# Patient Record
Sex: Female | Born: 1960 | Race: White | Hispanic: No | Smoking: Former smoker
Health system: Southern US, Community
[De-identification: ages and names within clinical notes are randomized; demographics above are authoritative.]

## PROBLEM LIST (undated history)

## (undated) DIAGNOSIS — R519 Headache, unspecified: Secondary | ICD-10-CM

## (undated) DIAGNOSIS — F32A Depression, unspecified: Secondary | ICD-10-CM

## (undated) DIAGNOSIS — E079 Disorder of thyroid, unspecified: Secondary | ICD-10-CM

## (undated) DIAGNOSIS — K219 Gastro-esophageal reflux disease without esophagitis: Secondary | ICD-10-CM

## (undated) DIAGNOSIS — E039 Hypothyroidism, unspecified: Secondary | ICD-10-CM

## (undated) DIAGNOSIS — E785 Hyperlipidemia, unspecified: Secondary | ICD-10-CM

## (undated) DIAGNOSIS — I10 Essential (primary) hypertension: Secondary | ICD-10-CM

## (undated) DIAGNOSIS — M1990 Unspecified osteoarthritis, unspecified site: Secondary | ICD-10-CM

## (undated) DIAGNOSIS — F329 Major depressive disorder, single episode, unspecified: Secondary | ICD-10-CM

## (undated) DIAGNOSIS — F419 Anxiety disorder, unspecified: Secondary | ICD-10-CM

## (undated) DIAGNOSIS — D649 Anemia, unspecified: Secondary | ICD-10-CM

## (undated) HISTORY — DX: Major depressive disorder, single episode, unspecified: F32.9

## (undated) HISTORY — PX: BACK SURGERY: SHX140

## (undated) HISTORY — DX: Depression, unspecified: F32.A

## (undated) HISTORY — DX: Hyperlipidemia, unspecified: E78.5

## (undated) HISTORY — DX: Disorder of thyroid, unspecified: E07.9

## (undated) HISTORY — DX: Anxiety disorder, unspecified: F41.9

## (undated) HISTORY — PX: CATARACT EXTRACTION: SUR2

## (undated) HISTORY — DX: Essential (primary) hypertension: I10

## (undated) HISTORY — PX: EYE SURGERY: SHX253

## (undated) HISTORY — PX: SPINE SURGERY: SHX786

## (undated) HISTORY — PX: NECK SURGERY: SHX720

## (undated) HISTORY — PX: ABDOMINAL HYSTERECTOMY: SHX81

## (undated) HISTORY — PX: OTHER SURGICAL HISTORY: SHX169

---

## 1999-04-05 ENCOUNTER — Emergency Department (HOSPITAL_COMMUNITY): Admission: EM | Admit: 1999-04-05 | Discharge: 1999-04-05 | Payer: Self-pay | Admitting: Emergency Medicine

## 2000-11-18 ENCOUNTER — Emergency Department (HOSPITAL_COMMUNITY): Admission: EM | Admit: 2000-11-18 | Discharge: 2000-11-18 | Payer: Self-pay | Admitting: Emergency Medicine

## 2000-11-18 ENCOUNTER — Encounter: Payer: Self-pay | Admitting: Emergency Medicine

## 2001-06-10 ENCOUNTER — Ambulatory Visit (HOSPITAL_COMMUNITY): Admission: RE | Admit: 2001-06-10 | Discharge: 2001-06-10 | Payer: Self-pay | Admitting: Obstetrics and Gynecology

## 2001-07-11 ENCOUNTER — Emergency Department (HOSPITAL_COMMUNITY): Admission: EM | Admit: 2001-07-11 | Discharge: 2001-07-11 | Payer: Self-pay | Admitting: Emergency Medicine

## 2002-11-01 ENCOUNTER — Emergency Department (HOSPITAL_COMMUNITY): Admission: EM | Admit: 2002-11-01 | Discharge: 2002-11-01 | Payer: Self-pay | Admitting: Emergency Medicine

## 2002-12-21 ENCOUNTER — Other Ambulatory Visit: Admission: RE | Admit: 2002-12-21 | Discharge: 2002-12-21 | Payer: Self-pay | Admitting: Gynecology

## 2004-02-22 ENCOUNTER — Inpatient Hospital Stay (HOSPITAL_COMMUNITY): Admission: RE | Admit: 2004-02-22 | Discharge: 2004-02-25 | Payer: Self-pay | Admitting: Specialist

## 2004-06-01 ENCOUNTER — Other Ambulatory Visit: Admission: RE | Admit: 2004-06-01 | Discharge: 2004-06-01 | Payer: Self-pay | Admitting: Family Medicine

## 2005-02-01 ENCOUNTER — Encounter (INDEPENDENT_AMBULATORY_CARE_PROVIDER_SITE_OTHER): Payer: Self-pay | Admitting: *Deleted

## 2005-02-01 ENCOUNTER — Inpatient Hospital Stay (HOSPITAL_COMMUNITY): Admission: RE | Admit: 2005-02-01 | Discharge: 2005-02-04 | Payer: Self-pay | Admitting: Obstetrics and Gynecology

## 2006-04-19 ENCOUNTER — Emergency Department (HOSPITAL_COMMUNITY): Admission: EM | Admit: 2006-04-19 | Discharge: 2006-04-19 | Payer: Self-pay | Admitting: Emergency Medicine

## 2007-11-11 ENCOUNTER — Emergency Department (HOSPITAL_COMMUNITY): Admission: EM | Admit: 2007-11-11 | Discharge: 2007-11-11 | Payer: Self-pay | Admitting: Emergency Medicine

## 2008-01-02 ENCOUNTER — Ambulatory Visit (HOSPITAL_COMMUNITY): Admission: RE | Admit: 2008-01-02 | Discharge: 2008-01-02 | Payer: Self-pay | Admitting: Neurological Surgery

## 2010-03-13 ENCOUNTER — Encounter: Admission: RE | Admit: 2010-03-13 | Discharge: 2010-03-13 | Payer: Self-pay | Admitting: Family Medicine

## 2010-11-28 NOTE — Op Note (Signed)
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   PROCEDURE:  Left lumbar hemilaminectomy, medial facetectomy and  foraminotomy for removal of synovial cyst utilizing microscopic  dissection.   SURGEON:  Tia Alert, MD   ASSISTANT:  Donalee Citrin, MD   ANESTHESIA:  General endotracheal.   COMPLICATIONS:  None apparent.   INDICATIONS FOR PROCEDURE:  [name] is a 50 year old female who  presented with severe left leg pain at L5 distribution.  She had an MRI  which showed a large synovial cyst at L4-5 on the left.  I recommended a  lumbar hemilaminectomy for resection of the synovial cyst.  She  understood the risks, benefits, expected outcome and wished to proceed.   DESCRIPTION OF PROCEDURE:  The patient was taken to the operating room  and after induction of adequate generalized endotracheal anesthesia, she  was rolled on a prone position on the Wilson frame and all pressure  points were padded.  Her lumbar region was prepped with DuraPrep and  then draped in the usual sterile fashion.  Local anesthesia 5 mL was  injected and a small dorsal midline incision was made and carried down  to the lumbosacral fascia.  The fascia was opened on the patient's left  side and taken down in a subperiosteal fashion to expose the  L4-5 on  the left.  Intraoperative x-ray confirmed my level at L4-5 and a  hemilaminectomy, medial facetectomy, and foraminotomy was done at L4-5  on the left side.  The underlying yellow  ligament was opened and removed  until the underlying synovial cyst was found.  This was bilobed and was  compressing the left L5 nerve root and deviating it medially.  I brought  in the operating microscope.  I separated the cyst from the thecal sac  and from the lateral and superior edge of the L5 nerve root with a  Penfield 4 dissector and then I was able to undercut the lateral recess  with a 2-mm Kerrison punch, removing the synovial cyst as I went along  until the thecal sac was well decompressed and the nerve root sat freely  in its natural position.  I then palpated under the lateral recess along  the pedicle.  I felt no more synovial cyst.  The synovial cyst was gone.  I irrigated with saline solution containing bacitracin, dried all  bleeding points, lined the dura with Duragen to prevent epidural  fibrosis and then closed the fascia with 0 Vicryl, closed the  subcutaneous and subcuticular tissues with 3-0 Vicryl and closed the  skin  with Benzoin and Steri-Strips.  The drape were removed.  Sterile dressing was applied.  The patient  awakened from general anesthesia and transported to the recovery room in  stable condition.  At the end of the procedure, all sponge, needle and  instrument sponge counts were correct.      Tia Alert, MD  Electronically Signed     DSJ/MEDQ  D:  01/02/2008  T:  01/02/2008  Job:  [id number]

## 2010-12-01 NOTE — Op Note (Signed)
Brittany Lynch, Brittany Lynch                         ACCOUNT NO.:  [id number]   MEDICAL RECORD NO.:  [id number]                   PATIENT TYPE:  INP   LOCATION:  2550                                 FACILITY:  [facility]   PHYSICIAN:  Kerrin Champagne, M.D.                DATE OF BIRTH:  Apr 20, 1961   DATE OF PROCEDURE:  02/22/2004  DATE OF DISCHARGE:                                 OPERATIVE REPORT   PREOPERATIVE DIAGNOSES:  Herniated nucleus pulposus right C6-7, bilateral  HNP C5-6 with degenerative disk disease.   POSTOPERATIVE DIAGNOSES:  Herniated nucleus pulposus right C6-7, bilateral  herniated nucleus pulposus C5-6 with degenerative disk disease.   PROCEDURE:  Anterior cervical diskectomy and fusion at C5-6 and C6-7 with  right iliac crest bone graft harvested through a separate incision. Internal  fixation with a 43 mm, 6-hole DePuy locking plate and screws, 13 mm screws  used at both C5 and C6 levels, 14 mm screws x2 at the C7 level.   SURGEON:  Kerrin Champagne, M.D.   ASSISTANT:  Wende Neighbors, P.A.   ANESTHESIA:  GOT, Dr. Krista Blue.   ESTIMATED BLOOD LOSS:  400 cc.   DRAINS:  Foley to straight drain, TLS 7 French x1 left neck.   INDICATIONS FOR PROCEDURE:  The patient is a 50 year old female with neck  pain, radiation to the left arm, weakness in the C7 distribution. She has  undergone extensive evaluation including MRI studies and myelograms which  have demonstrated a significant disk protrusion, central and right-sided C6-  7.  Myelogram suggested foraminal entrapment of the left C7 nerve root in  addition to the disk protrusion on the right side, combining to cause left-  sided symptoms and signs, disk degeneration with disk protrusion, bi-lobed  at C5-6.  The patient was brought to the operating room to undergo anterior  diskectomy and fusion at both segments with excision of HNP's at both  segments.   INTRAOPERATIVE FINDINGS:  The patient was found to have a moderately  large  disk protrusion on the right side at C6-7 with impingement on the cord, some  very minimal, mild malacia.  The C7 nerve root freed up nicely with  foraminotomy, as was the right side C7 nerve root. At C5-6, the patient had  primarily degenerative disk disease findings.  No significant protrusion  noted at time of surgery.  Primarily, degenerative disk changes and  spondylosis.   DESCRIPTION OF PROCEDURE:  After adequate general anesthesia with the  patient in the beach chair position, neck in slight extension with 5 pounds  cervical Halter traction, a bump on the right buttock, both arms at the  sides and elbows well-padded.  Standard preoperative antibiotics, standard  prep with DuraPrep solution over the right anterior neck and right iliac  crest, draped in the usual manner.  Iodine and Vi-Drape was used for the  anterior neck and right iliac  crest.   Incision over the left side of the neck approximately 3 fingerbreadths above  the medial aspect of the clavicle, in line with the patient's skin creases,  approximately 3 inches to 3-1/2 inches in length through the skin and  subcutaneous layers, carried down to the platysma layer.  Platysma layer  incised in line with the skin incision using Bovie electrocautery.  Bleeders  were controlled using electrocautery.  Metzenbaum scissors were then used to  spread the deeper fascial layers of the platysma, and then develop the  interval between the trachea and esophagus medially and the carotid sheath  laterally.  Blunt dissection using gloved finger down to the anterior aspect  of the cervical spine.  Hand-held Cloward was used to retract the trachea,  esophagus medially.  Electrocautery was used to cauterize prevertebral  fascia on the medial border of the longus colli muscle. This was teased  across the midline using Barista.  Spinal needles with the sheath  intact, allowing only 1 cm protruding were then inserted into the  disk space  at the expected C5-6 and C6-7 levels. Intraoperative myelo-radiograph  demonstrated the needle at the upper end at the C5-6 level. It was felt that  both needles were in the proper position and alignment.   Therefore, under direct vision, using hand-held power to protect soft  tissues, needles were removed and small portion of the anterior aspect of  the disk above C5-6 and C6-7 was resected to allow for continued  identification of these areas throughout the remaining portion of the  procedure.  Electrocautery was then used to carefully free up the longus  colli muscle along the anterior aspect of the cervical vertebra, exposing up  to the lateral aspects of the cervical vertebrae at both C5-6 and C6-7.  McCullough retractor, Bost type was then inserted with the foot of the blade  of the retractor beneath the medial border of the longus colli muscle,  providing retraction and exposure at the C6-7 level initially.  The 14 mm  screw posts were then inserted in the vertebral body of C6 and C7.  Distraction was obtained across the intervertebral disk space.  Three mm  Kerrison was used to debride the anterior lip osteophytes and the anterior  annulus off of the anterior portion of the disk space, removing anterior lip  osteophytes.  A 2-0 micro-curet was used to debride the cartilaginous  endplates, inferior aspect of C6, superior aspect of C7 back to the  posterior aspect of the disk space.  High speed bur used to carefully bur  the endplates down to bleeding, hard, subcortical bone.   Operating room microscope draped sterilely, brought into the field under  direct visualization.  Posterior lip osteophytes were resected, both over  the posterosuperior aspect of C7, posteroinferior aspect of C6.  Posterior  annulus and posterior longitudinal ligaments were then resected, disk  material noted on the right side within the spinal canal, extruded disk material compressing the right  side of the cord. This was resected using  micro-pituitary rongeurs. Foraminotomy performed over the right and left C7  nerve roots.  Posterior lip osteophytes were resected completely on the  right side at C6-7. With this, it was felt that decompression was complete.  The depth of the intervertebral disk space was measured with a Cloward depth  gauge at 16 mm of height, with a sounder off of the DePuy set, #7 sounder  appeared to give the best fit.   Note  that while x-ray identification levels was obtained, the incision was  made over the right iliac crest using a 10 blade scalpel in line with the  patient's previous skin scars and striae over the right iliac crest, through  the skin and subcutaneous layers directly down to the iliac crest.  The  iliac crest then exposed, both medial and lateral, using a Cobb elevator as  well as electrocautery and subperiosteal dissection.  The dual-blade  oscillating saw set at 7 mm was then used to cut the iliac crest, removing  tricortical bone, space divided using a 1/4 inch curved osteotome.  This was  then carefully tapered, the dimensions of the intervertebral disk space  depth of approximately 13-14 mm chosen. Care was taken to inspect the disk  space to ensure there was no soft tissue remaining that could be retropulsed  with insertion of the disk, disk then inserted, impacted into place. A small  amount of cancellous bone graft material was additionally placed over the  edges of the graft vertebrae interface, to ensure good healing of the bone  graft.   This completed, the screw post was removed from the vertebral body of C7.  Bone wax was applied to the bleeding, screw post hole.  The self-retaining  retractor then removed, and reinserted at the C5-6 level, foot of the blade  beneath the lower longus colli muscle again.  The 14 mm screw post then  inserted at the vertebral body of C5, distraction obtained across C5-6 disk  space.  A 3 mm  Kerrison was used to resect anterior lip osteophytes, as well  as anterior annular material.  The disk space was debrided in intervertebral  disk material using a 2-0 FS micro-curet, debriding the endplates of  cartilaginous attachments as well as disk material.  High speed bur was then  used to further debride the cartilaginous endplates down to bleeding,  endplate bone material.   This completed both for the inferior aspect of C5, superior aspect of C6,  thinning posterior lip osteophytes.  A 1 mm Kerrison was then used to resect  the posterior lip osteophytes, both over the superior aspect of C6 and the  inferior aspect of C7 posteriorly. Foraminotomy was performed of the right  side and left side at C5-6, decompressing the C6 nerve root bilaterally.  With this completed, noted the posterior longitudinal ligament as well as  the posterior annulus was resected, so the cord was found to have no further  compression evident at this level.  The height of the intervertebral disk space measured using a sounder, again  to 7 mm and this was found to be the best fit. The depth measured at 16 mm.   A second bone graft was harvested from the right iliac crest using a dual  oscillating saw set at 7 mm posterior to the previous graft removal  resection.  Protecting soft tissue structures medial and lateral using Cobb  retractors as well as [employer]. Graft then incised and the base of it  divided using 1/4 inch curved osteotome. Carefully tapering dimensions in  the intervertebral disk space, a J-type graft was necessary, removing 1 of  the cortices to allow for the graft to fit well within the disk space.  This  graft, measuring about 13-14 mm in depth, height of 7 mm, the edges  carefully tapered to allow for key in the graft, graft then inserted in  place. Care was taken to ensure that the intervertebral disk space  had no  remaining soft tissue or bone material that could be retropulsed from  the  insertion of the graft. Graft was then impacted into place without  difficulty.   The distraction pins were then removed at C5 and C6.  The 5 pounds of  cervical Halter traction weight was then removed.  The anterior lip  osteophytes were further smoothed using high speed bur, carefully protecting  the soft tissues with hand-held Cloward's at both C5-6 and C6-7.   Expected length of the plate was then chosen based on a bone wax coated  cottonoid string applied across the anterior aspect of the disk space,  extending from C5-C7. With this completed, a 43 mm plate was chosen. This  was placed across the anterior aspect of the cervical spine, extending from  C5 to C7, pinned into the C5 vertebral body using a temporary retaining pin.  The plate was carefully positioned and centered, the patient's chin centered  as well. With this, then the first screw holes were placed at the C6 level;  first the left side drilling to 14 mm and placing a 13 mm screw, then on the  right side drilling to 14 mm and placing a 13 mm screw.   Retaining pin removed at C5 and then 13 mm screw was placed on the right  side, then the left side; first drilling at 14 mm and placing a 13 mm screw  on the right side, and drilling at 14 mm and placing a 13 mm screw on the  left side.  Exposure then was obtained down to C7 and here, 14 mm screws  were used; first the right side placed drilling to 14 mm and placing the 14  mm screw, and then on the left side as well. Excellent purchase was obtained  with all 6 screws.  Locking screw mechanism, locking caps were then turned,  locking each of the screws to the plate appropriately. Irrigation was  performed. Careful inspection of the soft tissue of the esophagus  demonstrated no abnormalities present. Both Gelfoam was used to hemostase  the disk space at the C6-7 level, while performing work at the C5-6 level,  and this was removed prior to closure and placement of  plates.  Intraoperative lateral radiograph then obtained with traction on both arms,  demonstrating the plates and screws fixed into the C5 and C7 level in good  position, alignment. The patient's plates and screws showed no evidence of  posterior placement, and the patient's bone grafts appeared to be in good  position and alignment without evidence of retropulsion.  There was further  irrigation. Then a 7 [place] TLS drain placed in the depth of the incision,  exiting over the anteroinferior aspect of the incision. This was sewn in  place with a 4-0 nylon stitch.  Careful inspection demonstrated no active  bleeding evident.  The platysmal layer was then approximated with  interrupted 3-0 Vicryl sutures, deep subcutaneous layer was approximated  with interrupted 3-0 Vicryl sutures and skin was closed with running  subcutaneous stitch of 4-0 Vicryl.   Right iliac crest bone graft harvest site carefully hemostases, bone wax to  bleeding, cancellous bone surfaces, Gelfoam applied. The abdominal fascia  was approximated to the proximal thigh fascia with interrupted #1 Vicryl  sutures, deep subcutaneous layers approximated with interrupted #1 and 0-  Vicryl sutures, more superficial layers with interrupted 2-0 Vicryl sutures.  Skin showed an area of tear incision requiring some touching up of  the edges  of the incision, and then this was carefully approximated with subcutaneous  sutures of 3-0 Vicryl sutures, and then skin was closed with running  subcutaneous stitch of 4-0 Vicryl.  Tincture of Benzoin and Steri-Strips  applied to both the neck and right iliac crest.  Then 4 x 4's were affixed  to the skin with Hypafix tape.  TLS drain was charged.  The patient was  placed into a [place] collar. She was reactivated and extubated,  returned to the recovery room in satisfactory condition. All sponge,  instrument and needle counts were correct.                                                Kerrin Champagne, M.D.    Myra Rude  D:  02/22/2004  T:  02/22/2004  Job:  [id number]

## 2010-12-01 NOTE — Discharge Summary (Signed)
Brittany Lynch, REDMANN               ACCOUNT NO.:  [id number]   MEDICAL RECORD NO.:  [id number]          PATIENT TYPE:  INP   LOCATION:  9302                          FACILITY:  [facility]   PHYSICIAN:  Charles A. Delcambre, MDDATE OF BIRTH:  09/14/1960   DATE OF ADMISSION:  02/01/2005  DATE OF DISCHARGE:  02/04/2005                                 DISCHARGE SUMMARY   PRIMARY DISCHARGE DIAGNOSES:  1.  Endometriosis.  2.  Pelvic pain.  3.  Dyspareunia.  4.  Abnormal uterine bleeding.   PROCEDURES:  Transabdominal hysterectomy, bilateral salpingo-oophorectomy.   DISPOSITION:  Patient discharged home to follow up in the office in two  days, to discontinue staples.  She was given convalescent instructions and  prescription for Percocet 5/325 1-2 p.o. q.4h. p.r.n. #40 and Tandem one  p.o. daily #30.  She is to notify of any temperature over 100 degrees,  incision with erythema or drainage or significant increase of pain or  vaginal bleeding.  She was instructed no driving for two weeks.  No heavy  lifting over 25 pounds for one month.  Shower is okay for two weeks.  No  bath.  No vaginal penetration or sexual activity.   LABORATORY DATA:  Postoperative hemoglobin 10.8, hematocrit 32.2.  Pathology  is pending at the time of this dictation.  History and Physical as dictated  on the chart.   HOSPITAL COURSE:  The patient was admitted and underwent surgery as noted  above.  Postoperatively, she had no difficulty with pain with febrile  morbidity.  Blood pressure medication was to be started postop day #1.  She  had no difficulty voiding with catheter discontinued postoperative day #1.  She did have some issues with bowel function and gas discomfort.  Postop day  #2, she had poor intake of p.o. solids with some mild nausea, but mainly gas  discomfort.  She did not respond adequately to a suppository.  Was kept  overnight and had good relief overnight and was, therefore, discharged home  on postop  day #3.       CAD/MEDQ  D:  02/04/2005  T:  02/04/2005  Job:  [id number]

## 2010-12-01 NOTE — Op Note (Signed)
Brittany Lynch, Brittany Lynch               ACCOUNT NO.:  [id number]   MEDICAL RECORD NO.:  [id number]         PATIENT TYPE:  INP   LOCATION:  9399                          FACILITY:  [facility]   PHYSICIAN:  Charles A. Delcambre, MDDATE OF BIRTH:  December 27, 1960   DATE OF PROCEDURE:  DATE OF DISCHARGE:                                 OPERATIVE REPORT   PREOPERATIVE DIAGNOSES:  1.  Pelvic pain.  2.  Dyspareunia.  3.  Endometriosis.  4.  Abnormal uterine bleeding.   POSTOPERATIVE DIAGNOSES:  1.  Endometriosis.  2.  Pelvic pain.  3.  Dyspareunia.  4.  Abnormal uterine bleeding.   PROCEDURE:  Transabdominal hysterectomy and bilateral salpingo-oophorectomy.   ASSISTANT:  Dr. Ashley Royalty.   COMPLICATIONS:  None.   ESTIMATED BLOOD LOSS:  100 cc.   ANESTHESIA:  General via the endotracheal route.   SPECIMENS:  Uterus, tubes, and ovaries to pathology.   COUNTS:  Instrument, sponge, and needle count correct x2.   OPERATIVE FINDINGS:  Normal tubes and ovaries.  Adhesions of the ovary on  the right side to the posterior cul-de-sac.  Some dense adhesions of the  posterior cul-de-sac with some partial obliteration of the posterior cul-de-  sac in the midline.  Some partial resection of the uterosacral ligaments,  consistent with previous LUNA procedure.  Implants in the posterior cul-de-  sac and deep n the rectovaginal septum area, consistent with endometriosis,  noted.  Appeared to be burned out and fibrosed.  A nodular area was  consistent with the mass palpable on pelvic exam in the rectovaginal septum,  most consistent with a fibrotic area of burned out endometriosis, deep and  not directly am,enable to further dissection although visible and deep in  the pelvis.  As this appeared to be burned out, I did not proceed with  resection.   PROCEDURE:  The patient was taken to the operating room and placed in supine  position.  General anesthesia was induced without difficulty.  Sterile prep  and  drape were undertaken.  Pfannenstiel incision was made with the knife  and carried down to fascia.  The fascia was incised with the knife and Mayo  scissors.  Rectus sheath was released superiorly and inferiorly.  Rectus  muscles were sharply dissected in the midline.  Peritoneum was entered with  Metzenbaum scissors.  Peritoneum was opened further without damage to the  bladder.  Balfour retractor was placed.  Moistened laps were used to pack  the bowel out of the pelvis.  The cornu regions of the uterus were grasped  with Kelly clamps.  Round ligaments were opened bilaterally, transfixed with  0 Vicryl.  Hemostasis was excellent.  The infundibulopelvic pedicles were  isolated.  Ureters were well clear and visualized.  Free ties and then  transfixion stitch with 0 Vicryl were used to tie these pedicles.  Hemostasis was excellent and bladder was taken down anteriorly from the  lower uterine segment.  Successive pedicles were taken, including the  uterine vessels on either side, in a simple stitch.  Hemostasis was  excellent.  The cardinal ligaments, uterosacral ligaments  were taken to the  vaginal angle.  Transfixion stitch with 0 Vicryl.  Hemostasis was excellent.  The cervix was amputated from the vagina.  Richardson angle sutures were  placed across the vaginal angles and sutured to the uterosacral ligaments  and running 0 locking suture was then used to close the vagina.  Irrigation  was carried out.  Minor electrocautery and a single figure-of-8 2-0 Vicryl  were used on the cuff to achieve hemostasis.  Hemostasis of all pedicles was  excellent.  Balfour retractor and laps were removed.  Subfascial hemostasis  was excellent and the fascia was closed with #1 Vicryl running nonlocking  suture.  Subcutaneous hemostasis was excellent.  Irrigation was carried out.  Vicryl, 2-0, interrupted suture x5 was used to close this layer.  Sterile  skin clips were used to close the skin.  A dressing  was applied.  The  patient was taken to the recovery room placed in supine position, having  tolerated the procedure well.       CAD/MEDQ  D:  02/01/2005  T:  02/01/2005  Job:  [id number]

## 2010-12-01 NOTE — Discharge Summary (Signed)
NAMEITZAE, MCCURDY               ACCOUNT NO.:  [id number]   MEDICAL RECORD NO.:  [id number]          PATIENT TYPE:  INP   LOCATION:  5035                         FACILITY:  [facility]   PHYSICIAN:  Kerrin Champagne, M.D.   DATE OF BIRTH:  19-Mar-1961   DATE OF ADMISSION:  02/22/2004  DATE OF DISCHARGE:  02/25/2004                                 DISCHARGE SUMMARY   ADMISSION DIAGNOSIS:  1.  Herniated nucleus pulposus right C6-7, bilateral herniated nucleus      pulposus C5-6, with degenerative disc disease.  2.  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  3.  Hypertension.  4.  Anxiety.   DISCHARGE DIAGNOSES:  1.  Herniated nucleus pulposus right C6-7, bilateral herniated nucleus      pulposus C5-6, with degenerative disc disease.  2.  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  3.  Hypertension.  4.  Anxiety.   PROCEDURE:  On February 22, 2004, the patient underwent anterior cervical  diskectomy and fusion at C5-6 and C6-7, with right iliac crest bone graft  harvested through a separate fascial incision.  This was performed by Dr.  Otelia Sergeant, assisted by Wende Neighbors, P.A.-C., under general anesthesia.   CONSULTATIONS:  None.   BRIEF HISTORY:  The patient is a 50 year old female with chronic progressive  neck pain with radiation into the left arm and weakness in the C7  distribution.  Extensive evaluation has shown the patient to have  significant disc protrusion, central and right-sided C6-7, as well as  foraminal entrapment of the left C7 nerve root in addition to disc  protrusion on the right, combining to cause left-sided symptoms and signs.  Disc degeneration and disc protrusion noted also at the C5-6 level.  It was  felt she would require surgical intervention and was admitted for the  procedure as stated above.   BRIEF HOSPITAL COURSE:  The patient tolerated the procedure under general  anesthesia without complications.  Postoperatively, the patient continued to  have some discomfort in  the left upper extremity but improved from  preoperatively.  She was initially treated with PCA analgesics and gradually  weaned to p.o. analgesics.  She did have some sore throat and required ice  chips and clear liquids initially.  Gradually, she was able to eat a regular  diet.  She had no difficulties with swallowing.  On the first postoperative  day, her drain was discontinued, and dressing changes were done daily  thereafter.  She received physical therapy for ambulation and occupational  therapy for ADLs and tolerated this well.  The patient had some mild nausea  on the second postoperative day.  She had not had a bowel movement and was  having mild flatus.  She was treated with laxatives and suppository and  eventually was able to have a bowel movement.  She was voiding without  difficulty after her Foley catheter was discontinued.  On her third  postoperative day, she was felt to be stable for discharge, as her nausea  had subsided, and she was able to take orals without difficulty.  Neurovascular motor function of the upper extremities remained intact.  She  was afebrile, with vital signs stable, and her wound was without drainage on  her date of discharge.   PERTINENT LABORATORY VALUES:  CBC on admission was within normal limits,  with th exception of platelets 412.  Coagulation studies were normal on  admission.  Chemistry studies on admission normal, with the exception of  sodium 133.  Urinalysis on admission showed bacteria too numerous to count,  with few epithelial cells and 7-10 RBCs per high-power field.  The patient  did utilize the usual perioperative antibiotics and was asymptomatic for  urinary tract infection during the hospital stay.  EKG on admission with  normal sinus rhythm confirmed by Dr. Willa Rough.  No chest x-ray on the  chart at the time of this dictation.   PLAN:  The patient is discharged to her home.  She was given a prescription  for Ultram 1 tablet  q.6 h., as she had been taking this on a chronic basis.  She was given a prescription for Percocet to use 1-2 q.4-6 h. as needed for  severe pain; and Robaxin 800 mg one q.8 h. as needed for spasm.  She will  resume all other home medications as taken prior to admission.  She will  continue using ice to the back and front of her neck as well as to the right  hip.  She is instructed in walking as tolerated.  She will wear her Aspen  collar at all times, with the exception of showering, at which time she will  use her [place] collar.  Dressing changes will be done daily, and she  will shower once there is no drainage from her wound.  She will not be  allowed to drive.  No lifting over 2-5 pounds, and no overhead use of her  arms.  She will follow up with Dr. Otelia Sergeant two weeks from the date of her  surgery, and has been advised to call to arrange the appointment.   CONDITION ON DISCHARGE:  Stable.       SMV/MEDQ  D:  05/11/2004  T:  05/12/2004  Job:  [id number]

## 2010-12-01 NOTE — Op Note (Signed)
[facility]  Patient:    Brittany Lynch, Brittany Lynch Visit Number: [id number] MRN: [id number]          Service Type: DSU Location: [facility] Attending Physician:  Miguel Aschoff Dictated by:   Miguel Aschoff, M.D. Proc. Date: 06/10/01 Admit Date:  06/10/2001                             Operative Report  PREOPERATIVE DIAGNOSIS:       Chronic pelvic pain and dyspareunia.  POSTOPERATIVE DIAGNOSES:      Pelvic adhesions with possible adenomyosis.  PROCEDURE:                    Diagnostic laparoscopy with lysis of adhesions and laser uterosacral nerve ablation.  SURGEON:                      Miguel Aschoff, M.D.  ASSISTANT:                    None.  ANESTHESIA:                   General.  COMPLICATIONS:                None.  JUSTIFICATION:                The patient is a 50 year old white female with a history of chronic pelvic pain, worse in the left lower quadrant.  Also is associated with dyspareunia.  The patient has had a negative ultrasound.  She has not responded to medical therapy.  She presents now to undergo laparoscopy to see if an etiology for the pain can be established and corrected.  The risks and benefits of the procedure were discussed with the patient.  Informed consent has been obtained.  DESCRIPTION OF PROCEDURE:     The patient was taken to the operating room, placed in the supine position.  General anesthesia was administered without difficulty.  She was then placed in the dorsal lithotomy position; prepped and draped in the usual sterile fashion.  The bladder was catheterized.  Hulka tenaculum was then placed through the cervix and held.  The uterus was noted to be globular, taut, normal size and retroflex.  The adnexa revealed no masses.  Attention was directed to the umbilicus, where a small infraumbilical incision was made.  A Veress needle was inserted and the abdomen was insufflated with 3 L of CO2.  Following this a trocar to laparoscope  was placed, followed by the laparoscope itself.  To allow better visualization, a 5 mm port was established suprapubically under direct visualization. Systematic inspection of pelvic organs showed the anterior bladder and peritoneum to be unremarkable.  The round ligaments were unremarkable.  There was no evidence of any internal hernia.  The uterus appeared globular and retroflexed, suggestive of possible adenomyosis on the right side.  The patient was missing a segment of tube, consistent with the patients previous tubal sterilization.  The distal segment appeared to be normal.  The ovary appeared to be normal, but was adherent to the lateral pelvic sidewall by filmy adhesions and a dense adhesion.  On the left side, again, a tubal segment was missing.  Again, the left ovary was adhered to the lateral pelvic sidewall, as was the distal portion of the left tube.  In addition, there were adhesions of the appendices ______ to the lateral  pelvic sidewall.  The cul-de-sac was inspected; there was no definite implants of endometriosis noted.  It was remarkable, however, that the left uterosacral ligament appeared to be thickened.  The appendix was visualized and was felt to be within normal limits.  Intestinal surfaces were unremarkable.  Liver was unremarkable.  At this point, using laparoscopic scissors, the filmy adhesions were taken down sharply without difficulty.  It was possible to free both ovaries from the pelvic sidewall, as well as the adhesions at the distal portion of the left tube.  The adhesions of the appendices ______ taken down without difficulty.  Once this was done in an effort to try to decrease the patients pelvic pain, and because of the affected uterosacral ligament, the Yag laser was placed through the operating channel of the laparoscope.  Then with the GRP6 laser tip at 15 watts of power, the uterosacral ligaments were partially transected with care to avoid  injury to adjacent structures.  This was done without difficulty and with good hemostasis.  At this point, with no other abnormalities being noted, it was elected to complete the procedure.  CO2 was allowed to escape.  All instruments were removed.  The small incisions were closed using subcuticular 4-0 Vicryl.  ESTIMATED BLOOD LOSS:         Less than 10 cc.  DISPOSITION:                  The patient tolerated the procedure well.  Plans are for the patient to be discharged home.  HOME MEDICATIONS: 1. Tylox one q.3h. p.r.n. pain. 2. Doxycycline 100 mg b.i.d. x 3 days.  FOLLOW-UP:                    The patient will be seen back in four weeks for a follow-up examination.  She is to call for her findings on June 11, 2001.  She is to call for any problems such as fever, pain or heavy bleeding. Dictated by:   Miguel Aschoff, M.D. Attending Physician:  Miguel Aschoff DD:  06/10/01 TD:  06/10/01 Job: [id number]

## 2010-12-01 NOTE — H&P (Signed)
Brittany Lynch, Brittany Lynch               ACCOUNT NO.:  [id number]   MEDICAL RECORD NO.:  [id number]          PATIENT TYPE:  INP   LOCATION:  NA                            FACILITY:  [facility]   PHYSICIAN:  Charles A. Delcambre, MDDATE OF BIRTH:  04-02-1961   DATE OF ADMISSION:  02/01/2005  DATE OF DISCHARGE:                                HISTORY & PHYSICAL   REASON FOR ADMISSION:  Hysterectomy, secondary history of endometriosis and  ongoing pelvic pain.   HISTORY OF PRESENT ILLNESS:  She is a 50 year old, para 3-0-2-3 with history  of endometriosis failing LUNA as well as other conservative measures.  Continued with severe dysmenorrhea and hot flashes, cramps, dyspareunia,  pelvic pain.   PAST MEDICAL HISTORY:  Depression, hypertension.   PAST SURGICAL HISTORY:  Tubal ligation, laparoscopy with LUNA, SVD x3, TAB  x1, SAB x1.   MEDICATIONS:  1.  Cymbalta 60 mg once a day.  2.  Lisinopril 20/25 once a day.   ALLERGIES:  No known drug allergies.   SOCIAL HISTORY:  Quit smoking 1 1/2 years ago. No tobacco, ethanol or drug  use or STD exposure in the past otherwise. The patient is married in a  monogamous relationship with her husband.   FAMILY HISTORY:  Father deceased age 52 alcoholism, diabetes, hypertension.  Mother deceased age 58 alcoholism. Three sisters 55, 45 and 45 all doing  well, all with hypertension, however. Otherwise no major illnesses.   REVIEW OF SYMPTOMS:  Denies fever, chills, rashes, lesions, headaches,  dizziness, seasonal allergies, chest pain, shortness of breath, wheezing,  diarrhea, constipation, bleeding, melena, hematochezia, urgency, frequency,  dysuria, incontinence, hematuria, galactorrhea, emotional changes.   PHYSICAL EXAMINATION:  GENERAL:  Alert and oriented x3, no distress.  VITAL SIGNS:  Blood pressure 110/72, heart rate 76, weight 146 pounds,  respirations 18.  HEENT:  Grossly within normal limits.  NECK:  Supple without thyromegaly or  adenopathy.  LUNGS:  Clear bilaterally.  HEART:  Regular rate and rhythm without murmurs, rubs or gallops.  BREASTS:  Symmetrical, remainder of examination deferred.  ABDOMEN:  Soft, flat, nontender, no hepatosplenomegaly or other masses  noted.  PELVIC:  Normal external female genitalia. Bartholin's, urethra and Skene's  glands within normal limits. Vault without discharge or lesions. Multiparous  cervix is noted. No cervical motion tenderness is present. Endometrial  biopsy was done. Some fragments of endocervical endometrial tissue were  noted benign. On bimanual examination, uterus with some tenderness and  nodularity present posteriorly. A single nodule about 1.5 cm was tender,  some fluctuants, not exactly firm but very tender at the posterior aspect of  the uterus impinged on the rectum firmly. With rectovaginal examination it  is present in the midline. Adnexa is nontender without masses bilaterally.  Otherwise some nodularity of the uterus bilaterally. Anus nodularity of the  uterus bilaterally. Anus, perineum and body appeared normal otherwise. Brown  stools noted in the vault. Uterus was not enlarged otherwise. Ovaries  otherwise not enlarged bilaterally.   ASSESSMENT:  Pelvic pain, endometriosis, irregular periods, dyspareunia.   PLAN:  Transabdominal hysterectomy, bilateral salpingo-oophorectomy. All  questions were answered, she accepts the risk of infection, bleeding, bowel  or bladder damage, blood product risks including hepatitis and HIV exposure.  All questions were answered. Ureteral damage as well. Will proceed as  outlined, n.p.o. past midnight the evening prior to surgery. Will plan a  bowel prep with Cefoxitin 1 g IV.       CAD/MEDQ  D:  01/25/2005  T:  01/25/2005  Job:  [id number]

## 2011-04-10 LAB — POCT I-STAT, CHEM 8
Calcium, Ion: 1.14
Chloride: 104
Creatinine, Ser: 0.8
HCT: 42
Hemoglobin: 14.3
TCO2: 28

## 2011-04-10 LAB — URINALYSIS, ROUTINE W REFLEX MICROSCOPIC
Bilirubin Urine: NEGATIVE
Ketones, ur: NEGATIVE
Nitrite: NEGATIVE

## 2011-04-10 LAB — POCT CARDIAC MARKERS
CKMB, poc: 2.1
Operator id: 196461
Troponin i, poc: <0.05

## 2011-04-10 LAB — DIFFERENTIAL
Basophils Absolute: 0
Basophils Relative: 0
Eosinophils Absolute: 0
Lymphocytes Relative: 24
Monocytes Relative: 4
Neutrophils Relative %: 72

## 2011-04-10 LAB — CBC
HCT: 41.1
MCV: 84.8
Platelets: 329
RBC: 4.85
RDW: 13.3

## 2011-04-12 LAB — DIFFERENTIAL
Basophils Absolute: 0.1
Basophils Relative: 1
Eosinophils Relative: 1
Lymphocytes Relative: 33
Lymphs Abs: 2.6
Monocytes Absolute: 0.5

## 2011-04-12 LAB — BASIC METABOLIC PANEL
CO2: 30
Creatinine, Ser: 0.77
GFR calc Af Amer: >60
Sodium: 141

## 2011-04-12 LAB — CBC
Hemoglobin: 13.8
Platelets: 364
RBC: 4.67

## 2011-04-12 LAB — PROTIME-INR: INR: 0.9

## 2011-08-15 ENCOUNTER — Other Ambulatory Visit: Payer: Self-pay | Admitting: Neurological Surgery

## 2011-08-15 DIAGNOSIS — M545 Low back pain: Secondary | ICD-10-CM

## 2011-08-20 ENCOUNTER — Ambulatory Visit
Admission: RE | Admit: 2011-08-20 | Discharge: 2011-08-20 | Disposition: A | Discharge status: Home / Self Care | Payer: 59 | Source: Ambulatory Visit | Attending: Neurological Surgery | Admitting: Neurological Surgery

## 2011-08-20 DIAGNOSIS — M545 Low back pain: Secondary | ICD-10-CM

## 2011-08-20 MED ORDER — GADOBENATE DIMEGLUMINE 529 MG/ML IV SOLN
14.0000 mL | Freq: Once | INTRAVENOUS | Status: AC | PRN
  Administered 2011-08-20: 14 mL via INTRAVENOUS

## 2013-08-17 DIAGNOSIS — M5417 Radiculopathy, lumbosacral region: Secondary | ICD-10-CM | POA: Insufficient documentation

## 2013-09-15 ENCOUNTER — Encounter: Payer: Self-pay | Admitting: General Practice

## 2013-09-15 ENCOUNTER — Ambulatory Visit (INDEPENDENT_AMBULATORY_CARE_PROVIDER_SITE_OTHER): Payer: 59 | Admitting: General Practice

## 2013-09-15 ENCOUNTER — Encounter (INDEPENDENT_AMBULATORY_CARE_PROVIDER_SITE_OTHER): Payer: Self-pay

## 2013-09-15 VITALS — BP 136/76 | HR 92 | Temp 97.8°F | Ht 62.0 in | Wt 152.0 lb

## 2013-09-15 DIAGNOSIS — E785 Hyperlipidemia, unspecified: Secondary | ICD-10-CM

## 2013-09-15 DIAGNOSIS — Z01419 Encounter for gynecological examination (general) (routine) without abnormal findings: Secondary | ICD-10-CM

## 2013-09-15 DIAGNOSIS — I10 Essential (primary) hypertension: Secondary | ICD-10-CM

## 2013-09-15 DIAGNOSIS — Z124 Encounter for screening for malignant neoplasm of cervix: Secondary | ICD-10-CM

## 2013-09-15 LAB — POCT UA - MICROSCOPIC ONLY
Bacteria, U Microscopic: NEGATIVE
CASTS, UR, LPF, POC: NEGATIVE
Crystals, Ur, HPF, POC: NEGATIVE
Mucus, UA: NEGATIVE
WBC, UR, HPF, POC: NEGATIVE
YEAST UA: NEGATIVE

## 2013-09-15 LAB — POCT URINALYSIS DIPSTICK
Bilirubin, UA: NEGATIVE
GLUCOSE UA: NEGATIVE
Ketones, UA: NEGATIVE
Leukocytes, UA: NEGATIVE
Nitrite, UA: NEGATIVE
PROTEIN UA: NEGATIVE
Spec Grav, UA: <=1.005
UROBILINOGEN UA: NEGATIVE
pH, UA: 6.5

## 2013-09-15 MED ORDER — ATORVASTATIN CALCIUM 10 MG PO TABS: 10.0000 mg | ORAL_TABLET | Freq: Every day | ORAL | Status: DC

## 2013-09-15 MED ORDER — HYDROCHLOROTHIAZIDE 25 MG PO TABS: 25.0000 mg | ORAL_TABLET | Freq: Every day | ORAL | Status: DC

## 2013-09-15 NOTE — Progress Notes (Signed)
   Subjective:    Patient ID: Brittany Lynch, female    DOB: 1961/06/30, 53 y.o.   MRN: [id number]  HPI Patient presents today to establish care. History of anxiety, hyperlipidemia, chronic back pain, htn, and hypothyroidism. Reports percocet and  Flexeril prescriptions are being written by ortho surgeon.     Review of Systems  Constitutional: Negative for fever and chills.  Respiratory: Negative for chest tightness and shortness of breath.   Cardiovascular: Negative for chest pain and palpitations.  Musculoskeletal: Positive for back pain.       Chronic back pain  Neurological: Negative for dizziness, weakness and headaches.  Psychiatric/Behavioral: Negative for suicidal ideas and sleep disturbance. The patient is not nervous/anxious.        Objective:   Physical Exam  Constitutional: She is oriented to person, place, and time. She appears well-developed and well-nourished.  HENT:  Head: Normocephalic and atraumatic.  Right Ear: External ear normal.  Left Ear: External ear normal.  Nose: Right sinus exhibits maxillary sinus tenderness and frontal sinus tenderness. Left sinus exhibits maxillary sinus tenderness and frontal sinus tenderness.  Mouth/Throat: Oropharynx is clear and moist.  Eyes: Conjunctivae and EOM are normal. Pupils are equal, round, and reactive to light.  Neck: Normal range of motion. Neck supple. No thyromegaly present.  Cardiovascular: Normal rate, regular rhythm, normal heart sounds and intact distal pulses.   Pulmonary/Chest: Effort normal and breath sounds normal. No respiratory distress. She exhibits no tenderness. Right breast exhibits no inverted nipple, no mass, no nipple discharge, no skin change and no tenderness. Left breast exhibits no inverted nipple, no mass, no nipple discharge, no skin change and no tenderness. Breasts are symmetrical.  Abdominal: Soft. Bowel sounds are normal. She exhibits no distension.  Genitourinary: Vagina normal. No breast  swelling, tenderness, discharge or bleeding. No labial fusion. There is no rash, tenderness, lesion or injury on the right labia. There is no rash, tenderness, lesion or injury on the left labia. No erythema, tenderness or bleeding around the vagina. No foreign body around the vagina. No signs of injury around the vagina. No vaginal discharge found.  Lymphadenopathy:    She has no cervical adenopathy.  Neurological: She is alert and oriented to person, place, and time.  Skin: Skin is warm and dry.  Psychiatric: She has a normal mood and affect.          Assessment & Plan:  1. Encounter for routine gynecological examination  - POCT urinalysis dipstick - POCT UA - Microscopic Only - Pap IG w/ reflex to HPV when ASC-U  2. Hypertension  - hydrochlorothiazide (HYDRODIURIL) 25 MG tablet; Take 1 tablet (25 mg total) by mouth daily.  Dispense: 30 tablet; Refill: 4  3. Hyperlipidemia  - atorvastatin (LIPITOR) 10 MG tablet; Take 1 tablet (10 mg total) by mouth daily.  Dispense: 30 tablet; Refill: 4 Continue all current medications Labs pending F/u in 3 months Discussed benefits of regular exercise and healthy eating Patient verbalized understanding Erby Pian, FNP-C

## 2013-09-15 NOTE — Patient Instructions (Signed)

## 2013-09-16 ENCOUNTER — Other Ambulatory Visit (INDEPENDENT_AMBULATORY_CARE_PROVIDER_SITE_OTHER): Payer: 59

## 2013-09-16 DIAGNOSIS — I10 Essential (primary) hypertension: Secondary | ICD-10-CM

## 2013-09-16 DIAGNOSIS — E785 Hyperlipidemia, unspecified: Secondary | ICD-10-CM

## 2013-09-16 DIAGNOSIS — E039 Hypothyroidism, unspecified: Secondary | ICD-10-CM

## 2013-09-17 NOTE — Progress Notes (Signed)
Pt came in for labs only 

## 2013-09-17 NOTE — Addendum Note (Signed)
Addended by: Earlene Plater on: 09/17/2013 09:03 AM   Modules accepted: Orders

## 2013-09-18 DIAGNOSIS — E785 Hyperlipidemia, unspecified: Secondary | ICD-10-CM | POA: Insufficient documentation

## 2013-09-18 DIAGNOSIS — I10 Essential (primary) hypertension: Secondary | ICD-10-CM | POA: Insufficient documentation

## 2013-09-18 LAB — PAP IG W/ RFLX HPV ASCU: PAP Smear Comment: 0

## 2013-09-18 LAB — LIPID PANEL
Chol/HDL Ratio: 3.1 ratio units (ref 0.0–4.4)
Cholesterol, Total: 216 mg/dL — ABNORMAL HIGH (ref 100–199)
HDL: 69 mg/dL (ref >39)
LDL Calculated: 113 mg/dL — ABNORMAL HIGH (ref 0–99)
TRIGLYCERIDES: 170 mg/dL — AB (ref 0–149)
VLDL CHOLESTEROL CAL: 34 mg/dL (ref 5–40)

## 2013-09-18 LAB — CMP14+EGFR
A/G RATIO: 2.2 (ref 1.1–2.5)
ALBUMIN: 4.8 g/dL (ref 3.5–5.5)
ALK PHOS: 73 IU/L (ref 39–117)
ALT: 34 IU/L — ABNORMAL HIGH (ref 0–32)
AST: 28 IU/L (ref 0–40)
BUN / CREAT RATIO: 18 (ref 9–23)
BUN: 10 mg/dL (ref 6–24)
CHLORIDE: 98 mmol/L (ref 97–108)
CO2: 26 mmol/L (ref 18–29)
CREATININE: 0.55 mg/dL — AB (ref 0.57–1.00)
Calcium: 9.7 mg/dL (ref 8.7–10.2)
GFR calc non Af Amer: 107 mL/min/{1.73_m2} (ref >59)
GFR, EST AFRICAN AMERICAN: 124 mL/min/{1.73_m2} (ref >59)
GLOBULIN, TOTAL: 2.2 g/dL (ref 1.5–4.5)
Glucose: 94 mg/dL (ref 65–99)
POTASSIUM: 3.8 mmol/L (ref 3.5–5.2)
Sodium: 143 mmol/L (ref 134–144)
Total Bilirubin: 0.3 mg/dL (ref 0.0–1.2)
Total Protein: 7 g/dL (ref 6.0–8.5)

## 2013-09-18 LAB — THYROID PANEL WITH TSH
Free Thyroxine Index: 3.3 (ref 1.2–4.9)
T3 Uptake Ratio: 29 % (ref 24–39)
T4 TOTAL: 11.3 ug/dL (ref 4.5–12.0)
TSH: 1.65 u[IU]/mL (ref 0.450–4.500)

## 2013-09-21 ENCOUNTER — Other Ambulatory Visit: Payer: Self-pay | Admitting: General Practice

## 2013-09-21 ENCOUNTER — Telehealth: Payer: Self-pay | Admitting: *Deleted

## 2013-09-21 ENCOUNTER — Telehealth: Payer: Self-pay | Admitting: General Practice

## 2013-09-21 DIAGNOSIS — F411 Generalized anxiety disorder: Secondary | ICD-10-CM

## 2013-09-21 MED ORDER — ALPRAZOLAM 1 MG PO TABS: 1.0000 mg | ORAL_TABLET | Freq: Every evening | ORAL | Status: DC | PRN

## 2013-09-21 NOTE — Telephone Encounter (Signed)
Message copied by Shelbie Ammons on Mon Sep 21, 2013  4:16 PM ------      Message from: Aleen Sells E      Created: Mon Sep 21, 2013  3:41 PM       Please inform pap results are negative. Triglycerides/LDL elevated, continue lipitor, work in healthier eating. Thyroid level wnl. ------

## 2013-09-21 NOTE — Telephone Encounter (Signed)
Aware of lab results  

## 2013-09-21 NOTE — Telephone Encounter (Signed)
done

## 2013-09-21 NOTE — Telephone Encounter (Signed)
Xanax script ready. Notified of labs.

## 2013-09-23 ENCOUNTER — Other Ambulatory Visit: Payer: Self-pay | Admitting: *Deleted

## 2013-09-23 ENCOUNTER — Telehealth: Payer: Self-pay | Admitting: General Practice

## 2013-09-23 MED ORDER — LEVOTHYROXINE SODIUM 75 MCG PO TABS: 75.0000 ug | ORAL_TABLET | Freq: Every day | ORAL | Status: DC

## 2013-09-23 NOTE — Telephone Encounter (Signed)
This went over electronically and was received by the pharmacy at 10:27 today.

## 2013-09-25 ENCOUNTER — Other Ambulatory Visit: Payer: Self-pay | Admitting: General Practice

## 2013-09-25 MED ORDER — LEVOTHYROXINE SODIUM 75 MCG PO TABS: 75.0000 ug | ORAL_TABLET | Freq: Every day | ORAL | Status: DC

## 2013-09-25 NOTE — Telephone Encounter (Signed)
Script sent to pharmacy.

## 2013-11-04 DIAGNOSIS — M5416 Radiculopathy, lumbar region: Secondary | ICD-10-CM | POA: Insufficient documentation

## 2013-11-06 DIAGNOSIS — M48061 Spinal stenosis, lumbar region without neurogenic claudication: Secondary | ICD-10-CM | POA: Insufficient documentation

## 2013-11-10 ENCOUNTER — Telehealth: Payer: Self-pay | Admitting: General Practice

## 2013-11-10 NOTE — Telephone Encounter (Signed)
Lm to call back

## 2013-11-10 NOTE — Telephone Encounter (Signed)
Appt made with bill oxford

## 2013-11-11 ENCOUNTER — Ambulatory Visit (INDEPENDENT_AMBULATORY_CARE_PROVIDER_SITE_OTHER): Payer: 59 | Admitting: Family Medicine

## 2013-11-11 ENCOUNTER — Encounter: Payer: Self-pay | Admitting: Family Medicine

## 2013-11-11 VITALS — BP 134/83 | HR 83 | Temp 97.8°F | Ht 62.0 in | Wt 155.6 lb

## 2013-11-11 DIAGNOSIS — F411 Generalized anxiety disorder: Secondary | ICD-10-CM

## 2013-11-11 MED ORDER — ALPRAZOLAM 1 MG PO TABS: 1.0000 mg | ORAL_TABLET | Freq: Every evening | ORAL | Status: DC | PRN

## 2013-11-11 MED ORDER — BUPROPION HCL ER (XL) 150 MG PO TB24: 150.0000 mg | ORAL_TABLET | Freq: Every day | ORAL | Status: DC

## 2013-11-11 NOTE — Progress Notes (Signed)
   Subjective:    Patient ID: DENNY LAVE, female    DOB: 05-Jan-1961, 53 y.o.   MRN: [id number]  HPI This 53 y.o. female presents for evaluation of depression.  She has been on prozac and wellbutrin. .   Review of Systems No chest pain, SOB, HA, dizziness, vision change, N/V, diarrhea, constipation, dysuria, urinary urgency or frequency, myalgias, arthralgias or rash.     Objective:   Physical Exam  Vital signs noted  Well developed well nourished female.  HEENT - Head atraumatic Normocephalic                Eyes - PERRLA, Conjuctiva - clear Sclera- Clear EOMI                Ears - EAC's Wnl TM's Wnl Gross Hearing WNL                Nose - Nares patent                 Throat - oropharanx wnl Respiratory - Lungs CTA bilateral Cardiac - RRR S1 and S2 without murmur GI - Abdomen soft Nontender and bowel sounds active x 4 Extremities - No edema. Neuro - Grossly intact.      Assessment & Plan:  Generalized anxiety disorder - Plan: ALPRAZolam (XANAX) 1 MG tablet, buPROPion (WELLBUTRIN XL) 150 MG 24 hr tablet po qd  [name] FNP

## 2013-12-14 DIAGNOSIS — F1120 Opioid dependence, uncomplicated: Secondary | ICD-10-CM | POA: Insufficient documentation

## 2013-12-14 DIAGNOSIS — R0683 Snoring: Secondary | ICD-10-CM | POA: Insufficient documentation

## 2013-12-22 ENCOUNTER — Encounter: Payer: Self-pay | Admitting: Neurology

## 2013-12-22 ENCOUNTER — Ambulatory Visit (INDEPENDENT_AMBULATORY_CARE_PROVIDER_SITE_OTHER): Payer: 59 | Admitting: Neurology

## 2013-12-22 VITALS — BP 168/88 | HR 110 | Temp 98.2°F | Ht 62.0 in | Wt 155.0 lb

## 2013-12-22 DIAGNOSIS — G8929 Other chronic pain: Secondary | ICD-10-CM

## 2013-12-22 DIAGNOSIS — G4710 Hypersomnia, unspecified: Secondary | ICD-10-CM

## 2013-12-22 DIAGNOSIS — Z9889 Other specified postprocedural states: Secondary | ICD-10-CM

## 2013-12-22 DIAGNOSIS — M545 Low back pain, unspecified: Secondary | ICD-10-CM

## 2013-12-22 DIAGNOSIS — R0683 Snoring: Secondary | ICD-10-CM

## 2013-12-22 DIAGNOSIS — R0989 Other specified symptoms and signs involving the circulatory and respiratory systems: Secondary | ICD-10-CM

## 2013-12-22 DIAGNOSIS — F1190 Opioid use, unspecified, uncomplicated: Secondary | ICD-10-CM

## 2013-12-22 DIAGNOSIS — R400 Somnolence: Secondary | ICD-10-CM

## 2013-12-22 DIAGNOSIS — F1110 Opioid abuse, uncomplicated: Secondary | ICD-10-CM

## 2013-12-22 DIAGNOSIS — Z87828 Personal history of other (healed) physical injury and trauma: Secondary | ICD-10-CM

## 2013-12-22 DIAGNOSIS — R0609 Other forms of dyspnea: Secondary | ICD-10-CM

## 2013-12-22 NOTE — Patient Instructions (Signed)

## 2013-12-22 NOTE — Progress Notes (Signed)
Subjective:    Patient ID: Brittany Lynch is a 53 y.o. female.  HPI    Star Age, MD, PhD [facility] [address].O. [place], [place] [address]  Dear Dr. Brien Few,   I saw your patient, Brittany Lynch, upon your kind request in my neurologic clinic today for initial consultation of her sleep disorder, in particular, concern for underlying obstructive sleep apnea and the context of chronic narcotic pain medication. The patient is accompanied by her husband today. As you know, Brittany Lynch is a 53 year old right-handed woman with an underlying medical history of hypertension, hyperlipidemia, degenerative spine disease, depression, overweight state, status post lumbar spine (7/13) and cervical spine surgery (2003 or 2005), who has chronic low back pain with history of back injury in 12/14. She has been on Percocet chronically for almost 2 years. She also takes as needed Flexeril and Xanax when necessary at night. She reports snoring and her husband reports pauses in her breathing and she wakes up choking sometimes. She sleeps on her sides with typically one pillow between her legs and sometimes rolls onto her back. She reports EDS and her ESS is 13/24. She denies RLS and is not known to kick in her sleep. She rarely dreams. She does not have a set bedtime or wake time. She has not fallen asleep driving. She has been out of work for 6 months. She has a TV in the bedroom and they put it on a timer. There are no pets in bed.  She quit smoking some 11 years. She does not drink any alcohol. She currently does not take her Percocet after 6 PM per your instructions. She was snoring and felt sleepy during the day even before she was taking her Percocet regularly. Her daughter has been diagnosed with sleep apnea.  Her Past Medical History Is Significant For: Past Medical History  Diagnosis Date  . Thyroid disease   . Hypertension   . Hyperlipidemia   . Anxiety      Her Past Surgical History Is Significant For: Past Surgical History  Procedure Laterality Date  . Spine surgery    . Neck surgery      Her Family History Is Significant For: Family History  Problem Relation Age of Onset  . Alcohol abuse Father     Her Social History Is Significant For: History   Social History  . Marital Status: Married    Spouse Name: N/A    Number of Children: N/A  . Years of Education: N/A   Social History Main Topics  . Smoking status: Former Smoker    Quit date: 07/16/2001  . Smokeless tobacco: None  . Alcohol Use: No  . Drug Use: No  . Sexual Activity: None   Other Topics Concern  . None   Social History Narrative  . None    Her Allergies Are:  Allergies  Allergen Reactions  . Iodinated Diagnostic Agents   . Methotrexate Derivatives     Rash ,Short of breath  :   Her Current Medications Are:  Outpatient Encounter Prescriptions as of 12/22/2013  Medication Sig  . ALPRAZolam (XANAX) 1 MG tablet Take 1 tablet (1 mg total) by mouth at bedtime as needed for anxiety.  Marland Kitchen aspirin 325 MG tablet Take 650 mg by mouth daily.  Marland Kitchen atorvastatin (LIPITOR) 10 MG tablet Take 1 tablet (10 mg total) by mouth daily.  Marland Kitchen buPROPion (WELLBUTRIN XL) 150 MG 24 hr tablet Take 1 tablet (150  mg total) by mouth daily.  . cyclobenzaprine (FLEXERIL) 10 MG tablet Take 10 mg by mouth every 8 (eight) hours as needed for muscle spasms.  . hydrochlorothiazide (HYDRODIURIL) 25 MG tablet Take 1 tablet (25 mg total) by mouth daily.  Marland Kitchen levothyroxine (SYNTHROID, LEVOTHROID) 75 MCG tablet Take 1 tablet (75 mcg total) by mouth daily before breakfast.  . magnesium 30 MG tablet Take 30 mg by mouth 2 (two) times daily.  . Omega-3 Fatty Acids (FISH OIL PO) Take 1 capsule by mouth daily.  Marland Kitchen oxyCODONE-acetaminophen (PERCOCET/ROXICET) 5-325 MG per tablet Take 1-2 tablets by mouth every 6 (six) hours as needed for severe pain.   Review of Systems:  Out of a complete 14 point review  of systems, all are reviewed and negative with the exception of these symptoms as listed below:  Review of Systems  Constitutional: Positive for fatigue.  HENT: Negative.   Eyes: Negative.   Respiratory: Negative.   Cardiovascular: Negative.   Gastrointestinal: Negative.   Endocrine: Positive for cold intolerance and heat intolerance.  Genitourinary: Negative.   Musculoskeletal: Negative.   Skin: Negative.   Allergic/Immunologic: Negative.   Neurological: Negative.   Hematological: Negative.   Psychiatric/Behavioral: Positive for sleep disturbance (snoring, e.d.s.) and dysphoric mood. The patient is nervous/anxious.     Objective:  Neurologic Exam  Physical Exam Physical Examination:   Filed Vitals:   12/22/13 1521  BP: 168/88  Pulse: 110  Temp: 98.2 F (36.8 C)    General Examination: The patient is a very pleasant 53 y.o. female in no acute distress. She appears well-developed and well-nourished and well groomed. She is overweight.   HEENT: Normocephalic, atraumatic, pupils are equal, round and reactive to light and accommodation. Funduscopic exam is normal with sharp disc margins noted. Extraocular tracking is good without limitation to gaze excursion or nystagmus noted. Normal smooth pursuit is noted. Hearing is grossly intact. Tympanic membranes are clear bilaterally. Face is symmetric with normal facial animation and normal facial sensation. Speech is clear with no dysarthria noted. There is no hypophonia. There is no lip, neck/head, jaw or voice tremor. Neck is supple with full range of passive and active motion. There are no carotid bruits on auscultation. Oropharynx exam reveals: moderate mouth dryness, adequate dental hygiene and mild airway crowding, due to narrow airway entry. Mallampati is class I. Tongue protrudes centrally and palate elevates symmetrically. Tonsils are small or absent. Neck size is 13.25 inches. She has a Mild overbite. Nasal inspection reveals no  significant nasal mucosal bogginess or redness and no septal deviation. She has an unremarkable neck scar.   Chest: Clear to auscultation without wheezing, rhonchi or crackles noted.  Heart: S1+S2+0, regular and normal without murmurs, rubs or gallops noted.   Abdomen: Soft, non-tender and non-distended with normal bowel sounds appreciated on auscultation.  Extremities: There is no pitting edema in the distal lower extremities bilaterally. Pedal pulses are intact.  Skin: Warm and dry without trophic changes noted. There are no varicose veins.  Musculoskeletal: exam reveals no obvious joint deformities, tenderness or joint swelling or erythema.   Neurologically:  Mental status: The patient is awake, alert and oriented in all 4 spheres. Her immediate and remote memory, attention, language skills and fund of knowledge are appropriate. There is no evidence of aphasia, agnosia, apraxia or anomia. Speech is clear with normal prosody and enunciation. Thought process is linear. Mood is normal and affect is normal.  Cranial nerves II - XII are as described above under HEENT  exam. In addition: shoulder shrug is normal with equal shoulder height noted. Motor exam: Normal bulk, strength and tone is noted. There is no drift, tremor or rebound. Romberg is negative. Reflexes are 2+ throughout. Babinski: Toes are flexor bilaterally. Fine motor skills and coordination: intact with normal finger taps, normal hand movements, normal rapid alternating patting, normal foot taps and normal foot agility.  Cerebellar testing: No dysmetria or intention tremor on finger to nose testing. Heel to shin is unremarkable bilaterally. There is no truncal or gait ataxia.  Sensory exam: intact to light touch, pinprick, vibration, temperature sense in the upper and lower extremities, with the exception of decrease in PP sensation in all four extremities.   Gait, station and balance: She stands easily. No veering to one side is  noted. No leaning to one side is noted. Posture is age-appropriate and stance is narrow based. Gait shows normal stride length and normal pace. No problems turning are noted. She turns en bloc. Tandem walk is unremarkable.                Assessment and Plan:   In summary, DEANDRE STANSEL is a very pleasant 53 y.o.-year old female with a history and physical exam concerning for obstructive sleep apnea (OSA). She endorses snoring, witnessed pauses in her breathing, excessive daytime somnolence which she believes is not related to taking medication because she was sleepy even before she was taking her Percocet on a regular basis. Her daughter has been diagnosed with sleep apnea. I had a long chat with the patient and her husband about my findings and the diagnosis of OSA, its prognosis and treatment options. We talked about medical treatments, surgical interventions and non-pharmacological approaches. I explained in particular the risks and ramifications of untreated moderate to severe OSA, especially with respect to developing cardiovascular disease down the Road, including congestive heart failure, difficult to treat hypertension, cardiac arrhythmias, or stroke. Even type 2 diabetes has, in part, been linked to untreated OSA. Symptoms of untreated OSA include daytime sleepiness, memory problems, mood irritability and mood disorder such as depression and anxiety, lack of energy, as well as recurrent headaches, especially morning headaches. We talked about trying to maintain a healthy lifestyle in general, as well as the importance of weight control. I encouraged the patient to eat healthy, exercise daily and keep well hydrated, to keep a scheduled bedtime and wake time routine, to not skip any meals and eat healthy snacks in between meals. I advised the patient not to drive when feeling sleepy. I recommended the following at this time: sleep study with potential positive airway pressure titration.  I explained  the sleep test procedure to the patient and also outlined possible surgical and non-surgical treatment options of OSA, including the use of a custom-made dental device (which would require a referral to a specialist dentist or oral surgeon), upper airway surgical options, such as pillar implants, radiofrequency surgery, tongue base surgery, and UPPP (which would involve a referral to an ENT surgeon). Rarely, jaw surgery such as mandibular advancement may be considered.  I also explained the CPAP treatment option to the patient, who indicated that she would be willing to try CPAP if the need arises. I explained the importance of being compliant with PAP treatment, not only for insurance purposes but primarily to improve Her symptoms, and for the patient's long term health benefit, including to reduce Her cardiovascular risks. I also talked to her about improving her sleep hygiene today. I answered all  their questions today and the patient and her husband were in agreement. I would like to see her back after the sleep study is completed and encouraged her to call with any interim questions, concerns, problems or updates.   Thank you very much for allowing me to participate in the care of this nice patient. If I can be of any further assistance to you please do not hesitate to call me at [phone].  Sincerely,   Star Age, MD, PhD

## 2013-12-23 ENCOUNTER — Telehealth: Payer: Self-pay | Admitting: Family Medicine

## 2013-12-23 ENCOUNTER — Other Ambulatory Visit: Payer: Self-pay | Admitting: Family Medicine

## 2013-12-23 ENCOUNTER — Institutional Professional Consult (permissible substitution): Payer: 59 | Admitting: Neurology

## 2013-12-23 DIAGNOSIS — F411 Generalized anxiety disorder: Secondary | ICD-10-CM

## 2013-12-23 MED ORDER — BUPROPION HCL ER (XL) 300 MG PO TB24: 300.0000 mg | ORAL_TABLET | Freq: Every day | ORAL | Status: DC

## 2013-12-23 NOTE — Telephone Encounter (Signed)
wellbutrin is increased to 300mg  and sent to pharmacy and needs to follow up in one month

## 2013-12-23 NOTE — Telephone Encounter (Signed)
Still crying really bad she think that the wellburtrin needs to be a little higher in the dose? She says it seems to be doing well for her.

## 2013-12-24 NOTE — Telephone Encounter (Signed)
Patient aware appointment made in July

## 2014-01-04 ENCOUNTER — Telehealth: Payer: Self-pay | Admitting: Neurology

## 2014-01-04 DIAGNOSIS — R0683 Snoring: Secondary | ICD-10-CM

## 2014-01-04 DIAGNOSIS — R400 Somnolence: Secondary | ICD-10-CM

## 2014-01-04 NOTE — Telephone Encounter (Signed)
Brittany Lynch has denied the request for an attended sleep study.  Please advise if you would like to request a peer to peer or order a home sleep test instead.

## 2014-01-05 NOTE — Telephone Encounter (Signed)
Ok to proceed with HST.

## 2014-01-07 NOTE — Telephone Encounter (Signed)
LM for patient to schedule HST appt, told her we could take care of this Monday if she is available.  Asked her to call me back ASAP and we can schedule at her convenience.

## 2014-01-11 ENCOUNTER — Telehealth: Payer: Self-pay | Admitting: Family Medicine

## 2014-01-11 NOTE — Telephone Encounter (Signed)
appt given for tomorrow wit bill

## 2014-01-12 ENCOUNTER — Encounter: Payer: Self-pay | Admitting: Family Medicine

## 2014-01-12 ENCOUNTER — Ambulatory Visit (INDEPENDENT_AMBULATORY_CARE_PROVIDER_SITE_OTHER): Payer: 59 | Admitting: Family Medicine

## 2014-01-12 VITALS — BP 144/90 | HR 83 | Temp 97.8°F | Ht 62.0 in | Wt 158.2 lb

## 2014-01-12 DIAGNOSIS — I10 Essential (primary) hypertension: Secondary | ICD-10-CM

## 2014-01-12 DIAGNOSIS — F329 Major depressive disorder, single episode, unspecified: Secondary | ICD-10-CM

## 2014-01-12 DIAGNOSIS — F3289 Other specified depressive episodes: Secondary | ICD-10-CM

## 2014-01-12 DIAGNOSIS — F411 Generalized anxiety disorder: Secondary | ICD-10-CM

## 2014-01-12 DIAGNOSIS — F32A Depression, unspecified: Secondary | ICD-10-CM

## 2014-01-12 MED ORDER — LISINOPRIL 10 MG PO TABS: 10.0000 mg | ORAL_TABLET | Freq: Every day | ORAL | Status: DC

## 2014-01-12 MED ORDER — VENLAFAXINE HCL ER 75 MG PO CP24: 75.0000 mg | ORAL_CAPSULE | Freq: Every day | ORAL | Status: DC

## 2014-01-12 MED ORDER — VENLAFAXINE HCL ER 37.5 MG PO CP24: 37.5000 mg | ORAL_CAPSULE | Freq: Every day | ORAL | Status: DC

## 2014-01-12 MED ORDER — ALPRAZOLAM 1 MG PO TABS: 1.0000 mg | ORAL_TABLET | Freq: Every evening | ORAL | Status: DC | PRN

## 2014-01-12 NOTE — Telephone Encounter (Signed)
LM for patient on mobile to schedule HST appt, explained it required 15 minutes of her time here and then a night when she would be free to use the device in the comfort of her own home.  Asked her to return my call.

## 2014-01-12 NOTE — Progress Notes (Signed)
   Subjective:    Patient ID: Brittany Lynch, female    DOB: [birth year], 53 y.o.   MRN: [id number]  HPI C/o depression and elevated bp.  She is c/o depression sx's and the wellbutrin not working anymore And she thinks that the wellbutrin is causing her to have hypertension.  She has chronic back pain and Is seeing neurosurgery and may be getting back surgery.   Review of Systems C/o depression. No chest pain, SOB, HA, dizziness, vision change, N/V, diarrhea, constipation, dysuria, urinary urgency or frequency, myalgias, arthralgias or rash.     Objective:   Physical Exam  Vital signs noted  Well developed well nourished female.  HEENT - Head atraumatic Normocephalic                Eyes - PERRLA, Conjuctiva - clear Sclera- Clear EOMI                Ears - EAC's Wnl TM's Wnl Gross Hearing WNL                Throat - oropharanx wnl Respiratory - Lungs CTA bilateral Cardiac - RRR S1 and S2 without murmur GI - Abdomen soft Nontender and bowel sounds active x 4      Assessment & Plan:  Depression - Plan: venlafaxine XR (EFFEXOR XR) 37.5 MG 24 hr capsule, venlafaxine XR (EFFEXOR XR) 75 MG 24 hr capsule, ALPRAZolam (XANAX) 1 MG tablet DC wellbutrin.  Generalized anxiety disorder - Plan: venlafaxine XR (EFFEXOR XR) 37.5 MG 24 hr capsule, venlafaxine XR (EFFEXOR XR) 75 MG 24 hr capsule, ALPRAZolam (XANAX) 1 MG tablet,  DC wellbutrin.  Follow up in one month  Essential hypertension - Plan: lisinopril (PRINIVIL,ZESTRIL) 10 MG tablet One po qd and check bmp in one month at follow up.  Follow up in one month

## 2014-01-25 ENCOUNTER — Ambulatory Visit (INDEPENDENT_AMBULATORY_CARE_PROVIDER_SITE_OTHER): Payer: 59 | Admitting: Family Medicine

## 2014-01-25 ENCOUNTER — Encounter: Payer: Self-pay | Admitting: Family Medicine

## 2014-01-25 VITALS — BP 132/77 | HR 87 | Temp 97.7°F | Ht 62.0 in | Wt 156.8 lb

## 2014-01-25 DIAGNOSIS — F3289 Other specified depressive episodes: Secondary | ICD-10-CM

## 2014-01-25 DIAGNOSIS — F329 Major depressive disorder, single episode, unspecified: Secondary | ICD-10-CM

## 2014-01-25 DIAGNOSIS — F32A Depression, unspecified: Secondary | ICD-10-CM

## 2014-01-25 DIAGNOSIS — E785 Hyperlipidemia, unspecified: Secondary | ICD-10-CM

## 2014-01-25 DIAGNOSIS — I10 Essential (primary) hypertension: Secondary | ICD-10-CM

## 2014-01-25 LAB — POCT CBC
Granulocyte percent: 59.4 %G (ref 37–80)
HCT, POC: 41.8 % (ref 37.7–47.9)
Hemoglobin: 13.3 g/dL (ref 12.2–16.2)
Lymph, poc: 2.4 (ref 0.6–3.4)
MCH, POC: 27.9 pg (ref 27–31.2)
MCHC: 31.8 g/dL (ref 31.8–35.4)
MCV: 87.8 fL (ref 80–97)
MPV: 7.9 fL (ref 0–99.8)
POC Granulocyte: 4.1 (ref 2–6.9)
POC LYMPH PERCENT: 34.6 %L (ref 10–50)
Platelet Count, POC: 370 10*3/uL (ref 142–424)
RBC: 4.8 M/uL (ref 4.04–5.48)
RDW, POC: 13.4 %
WBC: 6.9 10*3/uL (ref 4.6–10.2)

## 2014-01-25 MED ORDER — ATORVASTATIN CALCIUM 10 MG PO TABS: 10.0000 mg | ORAL_TABLET | Freq: Every day | ORAL | Status: DC

## 2014-01-25 NOTE — Progress Notes (Signed)
   Subjective:    Patient ID: Brittany Lynch, female    DOB: Sep 09, 1960, 53 y.o.   MRN: [id number]  HPI This 53 y.o. female presents for evaluation of follow up on depression.  She is feeling better. She also has hx of htn, hypothyroidism, and hyperlipidemia.  She is needing refill on her Lipitor.  She has htn and she has been taking hctz and stopped.  She was rx'd lisinopril and her bp is doing better.   Review of Systems    No chest pain, SOB, HA, dizziness, vision change, N/V, diarrhea, constipation, dysuria, urinary urgency or frequency, myalgias, arthralgias or rash.  Objective:   Physical Exam  Vital signs noted  Well developed well nourished female.  HEENT - Head atraumatic Normocephalic                Eyes - PERRLA, Conjuctiva - clear Sclera- Clear EOMI                Ears - EAC's Wnl TM's Wnl Gross Hearing WNL                Nose - Nares patent                 Throat - oropharanx wnl Respiratory - Lungs CTA bilateral Cardiac - RRR S1 and S2 without murmur GI - Abdomen soft Nontender and bowel sounds active x 4 Extremities - No edema. Neuro - Grossly intact.      Assessment & Plan:  Hyperlipidemia - Plan: atorvastatin (LIPITOR) 10 MG tablet  Essential hypertension - Plan: Basic Metabolic Panel, POCT CBC DC HCTZ.  Depression - Currently doing a lot better on the effexor xr.  Continue and follow up in 3-4 months  [name] FNP

## 2014-01-26 LAB — BASIC METABOLIC PANEL
BUN/Creatinine Ratio: 18 (ref 9–23)
BUN: 12 mg/dL (ref 6–24)
CO2: 25 mmol/L (ref 18–29)
Calcium: 9.9 mg/dL (ref 8.7–10.2)
Chloride: 98 mmol/L (ref 97–108)
Creatinine, Ser: 0.66 mg/dL (ref 0.57–1.00)
GFR calc Af Amer: 117 mL/min/{1.73_m2} (ref >59)
GFR calc non Af Amer: 101 mL/min/{1.73_m2} (ref >59)
Glucose: 100 mg/dL — ABNORMAL HIGH (ref 65–99)
Potassium: 4.6 mmol/L (ref 3.5–5.2)
Sodium: 138 mmol/L (ref 134–144)

## 2014-01-28 DIAGNOSIS — M4807 Spinal stenosis, lumbosacral region: Secondary | ICD-10-CM | POA: Insufficient documentation

## 2014-02-03 ENCOUNTER — Telehealth: Payer: Self-pay | Admitting: Family Medicine

## 2014-02-04 ENCOUNTER — Other Ambulatory Visit: Payer: Self-pay | Admitting: Family Medicine

## 2014-02-04 DIAGNOSIS — F411 Generalized anxiety disorder: Secondary | ICD-10-CM

## 2014-02-04 DIAGNOSIS — F32A Depression, unspecified: Secondary | ICD-10-CM

## 2014-02-04 DIAGNOSIS — F329 Major depressive disorder, single episode, unspecified: Secondary | ICD-10-CM

## 2014-02-04 MED ORDER — VENLAFAXINE HCL ER 150 MG PO CP24: 150.0000 mg | ORAL_CAPSULE | Freq: Every day | ORAL | Status: DC

## 2014-02-04 NOTE — Telephone Encounter (Signed)
effexor rx increased and sent to pharm

## 2014-02-04 NOTE — Telephone Encounter (Signed)
Brittany Lynch

## 2014-03-12 ENCOUNTER — Other Ambulatory Visit: Payer: Self-pay | Admitting: Family Medicine

## 2014-03-12 ENCOUNTER — Telehealth: Payer: Self-pay | Admitting: Family Medicine

## 2014-03-12 NOTE — Telephone Encounter (Signed)
Changed lipitor to lovastatin but she is on effexor xr and you cannot just stop this.  She should follow up for visit and instructions on how to stop effexor

## 2014-03-15 MED ORDER — CITALOPRAM HYDROBROMIDE 40 MG PO TABS: 40.0000 mg | ORAL_TABLET | Freq: Every day | ORAL | Status: DC

## 2014-03-15 MED ORDER — LOVASTATIN 40 MG PO TABS: 40.0000 mg | ORAL_TABLET | Freq: Every day | ORAL | Status: DC

## 2014-03-15 NOTE — Telephone Encounter (Signed)
Changed effexor and atorvastatin to cheaper alternatives.  Patient will need follow up in 2-3 months and to monitor for changes in depression symptoms.  If she notices any changes or changes in thoughts to harm self or others to contact office or 911 immediatly.

## 2014-03-15 NOTE — Telephone Encounter (Signed)
Patient does not have insurance. She would have to pay out of pocket for an office visit. Do you have any suggestions for a cheaper alternative to generic Effexor? She requested Celexa but that is in a different drug category.

## 2014-03-15 NOTE — Telephone Encounter (Signed)
Discussed changes with patient. She will report any negative side effects and will notify her husband and daughter of the change in medication so that they can watch for any signs of a problem as well.  She has left a message for the [place] program to see if they can provide help with obtaining Effexor since it has helped so much.

## 2014-03-16 ENCOUNTER — Telehealth: Payer: Self-pay | Admitting: Family Medicine

## 2014-03-16 ENCOUNTER — Other Ambulatory Visit: Payer: Self-pay | Admitting: Family Medicine

## 2014-03-16 MED ORDER — LOVASTATIN 40 MG PO TABS: 40.0000 mg | ORAL_TABLET | Freq: Every day | ORAL | Status: DC

## 2014-03-19 ENCOUNTER — Other Ambulatory Visit: Payer: Self-pay | Admitting: Family Medicine

## 2014-03-19 ENCOUNTER — Telehealth: Payer: Self-pay | Admitting: Family Medicine

## 2014-03-19 MED ORDER — LOVASTATIN 40 MG PO TABS
40.0000 mg | ORAL_TABLET | Freq: Every day | ORAL | Status: DC
Start: 2014-03-19 — End: 2014-03-19

## 2014-03-19 MED ORDER — LOVASTATIN 20 MG PO TABS: 40.0000 mg | ORAL_TABLET | Freq: Every day | ORAL | Status: DC

## 2014-03-19 NOTE — Telephone Encounter (Signed)
Mevacor (lovastatin) sent to pharmacy.

## 2014-03-19 NOTE — Telephone Encounter (Signed)
done

## 2014-06-12 ENCOUNTER — Other Ambulatory Visit: Payer: Self-pay | Admitting: Family Medicine

## 2014-06-17 DIAGNOSIS — M5417 Radiculopathy, lumbosacral region: Secondary | ICD-10-CM | POA: Insufficient documentation

## 2014-09-13 ENCOUNTER — Other Ambulatory Visit: Payer: Self-pay | Admitting: Family Medicine

## 2014-09-13 ENCOUNTER — Telehealth: Payer: Self-pay | Admitting: Family

## 2014-09-13 MED ORDER — LEVOTHYROXINE SODIUM 75 MCG PO TABS: 75.0000 ug | ORAL_TABLET | Freq: Every day | ORAL | Status: DC

## 2014-09-13 NOTE — Telephone Encounter (Signed)
done

## 2014-09-20 ENCOUNTER — Ambulatory Visit: Payer: 59 | Admitting: Family

## 2014-09-20 ENCOUNTER — Encounter: Payer: Self-pay | Admitting: Family

## 2014-09-20 VITALS — BP 129/87 | HR 86 | Temp 98.6°F | Ht 62.0 in | Wt 164.8 lb

## 2014-09-20 DIAGNOSIS — E785 Hyperlipidemia, unspecified: Secondary | ICD-10-CM

## 2014-09-20 DIAGNOSIS — G8929 Other chronic pain: Secondary | ICD-10-CM

## 2014-09-20 DIAGNOSIS — F411 Generalized anxiety disorder: Secondary | ICD-10-CM

## 2014-09-20 DIAGNOSIS — I10 Essential (primary) hypertension: Secondary | ICD-10-CM

## 2014-09-20 DIAGNOSIS — F329 Major depressive disorder, single episode, unspecified: Secondary | ICD-10-CM

## 2014-09-20 DIAGNOSIS — E039 Hypothyroidism, unspecified: Secondary | ICD-10-CM

## 2014-09-20 DIAGNOSIS — M545 Low back pain, unspecified: Secondary | ICD-10-CM | POA: Insufficient documentation

## 2014-09-20 DIAGNOSIS — F32A Depression, unspecified: Secondary | ICD-10-CM

## 2014-09-20 MED ORDER — LISINOPRIL 10 MG PO TABS: 10.0000 mg | ORAL_TABLET | Freq: Every day | ORAL | Status: DC

## 2014-09-20 MED ORDER — CITALOPRAM HYDROBROMIDE 40 MG PO TABS: ORAL_TABLET | ORAL | Status: DC

## 2014-09-20 MED ORDER — LEVOTHYROXINE SODIUM 75 MCG PO TABS: 75.0000 ug | ORAL_TABLET | Freq: Every day | ORAL | Status: DC

## 2014-09-20 MED ORDER — LOVASTATIN 20 MG PO TABS: 40.0000 mg | ORAL_TABLET | Freq: Every day | ORAL | Status: DC

## 2014-09-20 MED ORDER — ALPRAZOLAM 1 MG PO TABS: 1.0000 mg | ORAL_TABLET | Freq: Every evening | ORAL | Status: DC | PRN

## 2014-09-20 NOTE — Progress Notes (Signed)
Subjective:    Patient ID: Brittany Lynch, female    DOB: 18-Feb-1961, 54 y.o.   MRN: [id number]  Hypertension This is a chronic problem. The current episode started more than 1 year ago. The problem has been resolved since onset. The problem is controlled. Associated symptoms include anxiety. Pertinent negatives include no headaches, malaise/fatigue, palpitations, peripheral edema, shortness of breath or sweats. Agents associated with hypertension include thyroid hormones. Risk factors for coronary artery disease include dyslipidemia, obesity and post-menopausal state. Past treatments include ACE inhibitors. The current treatment provides moderate improvement. Hypertensive end-organ damage includes a thyroid problem. There is no history of kidney disease, CAD/MI, CVA or heart failure.  Hyperlipidemia This is a chronic problem. The current episode started more than 1 year ago. The problem is uncontrolled. Recent lipid tests were reviewed and are high. Exacerbating diseases include hypothyroidism. She has no history of diabetes. Factors aggravating her hyperlipidemia include fatty foods. Pertinent negatives include no leg pain, myalgias or shortness of breath. Current antihyperlipidemic treatment includes statins. The current treatment provides moderate improvement of lipids. Risk factors for coronary artery disease include dyslipidemia, family history, hypertension, obesity and a sedentary lifestyle.  Thyroid Problem Presents for follow-up visit. Symptoms include anxiety, depressed mood, dry skin, fatigue and weight gain. Patient reports no diarrhea, hair loss or palpitations. The symptoms have been stable. Past treatments include levothyroxine. The treatment provided moderate relief. Her past medical history is significant for hyperlipidemia. There is no history of diabetes or heart failure.  Anxiety Presents for follow-up visit. Symptoms include depressed mood, excessive worry and nervous/anxious  behavior. Patient reports no irritability, nausea, palpitations or shortness of breath. Symptoms occur most days.   Her past medical history is significant for anxiety/panic attacks and depression. Past treatments include benzodiazephines and SSRIs. Compliance with prior treatments has been good.   *Pt has chronic low back pain and pt see's pain management to manages this.    Review of Systems  Constitutional: Positive for weight gain and fatigue. Negative for malaise/fatigue and irritability.  HENT: Negative.   Eyes: Negative.   Respiratory: Negative.  Negative for shortness of breath.   Cardiovascular: Negative.  Negative for palpitations.  Gastrointestinal: Negative.  Negative for nausea and diarrhea.  Endocrine: Negative.   Genitourinary: Negative.   Musculoskeletal: Negative.  Negative for myalgias.  Neurological: Negative.  Negative for headaches.  Hematological: Negative.   Psychiatric/Behavioral: The patient is nervous/anxious.   All other systems reviewed and are negative.      Objective:   Physical Exam  Constitutional: She is oriented to person, place, and time. She appears well-developed and well-nourished. No distress.  HENT:  Head: Normocephalic and atraumatic.  Right Ear: External ear normal.  Mouth/Throat: Oropharynx is clear and moist.  Eyes: Pupils are equal, round, and reactive to light.  Neck: Normal range of motion. Neck supple. No thyromegaly present.  Cardiovascular: Normal rate, regular rhythm, normal heart sounds and intact distal pulses.   No murmur heard. Pulmonary/Chest: Effort normal and breath sounds normal. No respiratory distress. She has no wheezes.  Abdominal: Soft. Bowel sounds are normal. She exhibits no distension. There is no tenderness.  Musculoskeletal: Normal range of motion. She exhibits no edema or tenderness.  Neurological: She is alert and oriented to person, place, and time. She has normal reflexes. No cranial nerve deficit.  Skin:  Skin is warm and dry.  Psychiatric: She has a normal mood and affect. Her behavior is normal. Judgment and thought content normal.  Vitals reviewed.  BP 129/87 mmHg  Pulse 86  Temp(Src) 98.6 F (37 C) (Oral)  Ht $R'5\' 2"'cf$  (1.575 m)  Wt 164 lb 12.8 oz (74.753 kg)  BMI 30.13 kg/m2     Assessment & Plan:  1. Essential hypertension - CMP14+EGFR - lisinopril (PRINIVIL,ZESTRIL) 10 MG tablet; Take 1 tablet (10 mg total) by mouth daily.  Dispense: 90 tablet; Refill: 4  2. Hyperlipidemia - CMP14+EGFR - lovastatin (MEVACOR) 20 MG tablet; Take 2 tablets (40 mg total) by mouth at bedtime.  Dispense: 180 tablet; Refill: 4  3. Depression - CMP14+EGFR - ALPRAZolam (XANAX) 1 MG tablet; Take 1 tablet (1 mg total) by mouth at bedtime as needed for anxiety.  Dispense: 30 tablet; Refill: 3 - citalopram (CELEXA) 40 MG tablet; TAKE ONE TABLET BY MOUTH ONCE DAILY MUST  HAVE  OFFICE  VISIT  FOR  ADDITIONAL  REFILLS  Dispense: 90 tablet; Refill: 4 - lisinopril (PRINIVIL,ZESTRIL) 10 MG tablet; Take 1 tablet (10 mg total) by mouth daily.  Dispense: 90 tablet; Refill: 4  4. GAD (generalized anxiety disorder) - CMP14+EGFR - ALPRAZolam (XANAX) 1 MG tablet; Take 1 tablet (1 mg total) by mouth at bedtime as needed for anxiety.  Dispense: 30 tablet; Refill: 3 - citalopram (CELEXA) 40 MG tablet; TAKE ONE TABLET BY MOUTH ONCE DAILY MUST  HAVE  OFFICE  VISIT  FOR  ADDITIONAL  REFILLS  Dispense: 90 tablet; Refill: 4  5. Hypothyroidism, unspecified hypothyroidism type - CMP14+EGFR - levothyroxine (SYNTHROID, LEVOTHROID) 75 MCG tablet; Take 1 tablet (75 mcg total) by mouth daily before breakfast.  Dispense: 90 tablet; Refill: 4 - TSH  6. Chronic lower back pain - CMP14+EGFR     Continue all meds Labs pending Health Maintenance reviewed Diet and exercise encouraged RTO 6 months  [name], FNP

## 2014-09-20 NOTE — Patient Instructions (Signed)

## 2014-09-21 LAB — CMP14+EGFR
A/G RATIO: 1.7 (ref 1.1–2.5)
ALBUMIN: 4.7 g/dL (ref 3.5–5.5)
ALT: 25 IU/L (ref 0–32)
AST: 24 IU/L (ref 0–40)
Alkaline Phosphatase: 66 IU/L (ref 39–117)
BUN/Creatinine Ratio: 11 (ref 9–23)
BUN: 7 mg/dL (ref 6–24)
Bilirubin Total: 0.2 mg/dL (ref 0.0–1.2)
CALCIUM: 9.6 mg/dL (ref 8.7–10.2)
CHLORIDE: 97 mmol/L (ref 97–108)
CO2: 25 mmol/L (ref 18–29)
CREATININE: 0.66 mg/dL (ref 0.57–1.00)
GFR calc non Af Amer: 100 mL/min/{1.73_m2} (ref >59)
GFR, EST AFRICAN AMERICAN: 116 mL/min/{1.73_m2} (ref >59)
GLOBULIN, TOTAL: 2.7 g/dL (ref 1.5–4.5)
GLUCOSE: 88 mg/dL (ref 65–99)
Potassium: 4.7 mmol/L (ref 3.5–5.2)
Sodium: 137 mmol/L (ref 134–144)
TOTAL PROTEIN: 7.4 g/dL (ref 6.0–8.5)

## 2014-09-21 LAB — TSH: TSH: 1.86 u[IU]/mL (ref 0.450–4.500)

## 2014-11-25 ENCOUNTER — Telehealth: Payer: Self-pay | Admitting: Family

## 2014-11-25 NOTE — Telephone Encounter (Signed)
Pt is on 1 mg of Ativan. I can not increase the amount. Orders on the bottle state taking nightly. I can not increase frequency at this time.

## 2014-11-26 NOTE — Telephone Encounter (Signed)
Patient aware that the frequency of her xanax can not be increased at this time.

## 2015-01-25 ENCOUNTER — Emergency Department (HOSPITAL_COMMUNITY)
Admission: EM | Admit: 2015-01-25 | Discharge: 2015-01-26 | Disposition: A | Discharge status: Other Acute Inpatient Hospital | Payer: Self-pay | Attending: Emergency Medicine | Admitting: Emergency Medicine

## 2015-01-25 ENCOUNTER — Encounter (HOSPITAL_COMMUNITY): Payer: Self-pay

## 2015-01-25 DIAGNOSIS — F419 Anxiety disorder, unspecified: Secondary | ICD-10-CM | POA: Insufficient documentation

## 2015-01-25 DIAGNOSIS — G8929 Other chronic pain: Secondary | ICD-10-CM | POA: Insufficient documentation

## 2015-01-25 DIAGNOSIS — Z79899 Other long term (current) drug therapy: Secondary | ICD-10-CM | POA: Insufficient documentation

## 2015-01-25 DIAGNOSIS — E785 Hyperlipidemia, unspecified: Secondary | ICD-10-CM | POA: Insufficient documentation

## 2015-01-25 DIAGNOSIS — Z7982 Long term (current) use of aspirin: Secondary | ICD-10-CM | POA: Insufficient documentation

## 2015-01-25 DIAGNOSIS — Z87891 Personal history of nicotine dependence: Secondary | ICD-10-CM | POA: Insufficient documentation

## 2015-01-25 DIAGNOSIS — R45851 Suicidal ideations: Secondary | ICD-10-CM

## 2015-01-25 DIAGNOSIS — F1310 Sedative, hypnotic or anxiolytic abuse, uncomplicated: Secondary | ICD-10-CM | POA: Insufficient documentation

## 2015-01-25 DIAGNOSIS — I10 Essential (primary) hypertension: Secondary | ICD-10-CM | POA: Insufficient documentation

## 2015-01-25 DIAGNOSIS — M549 Dorsalgia, unspecified: Secondary | ICD-10-CM | POA: Insufficient documentation

## 2015-01-25 DIAGNOSIS — E079 Disorder of thyroid, unspecified: Secondary | ICD-10-CM | POA: Insufficient documentation

## 2015-01-25 DIAGNOSIS — R45850 Homicidal ideations: Secondary | ICD-10-CM

## 2015-01-25 LAB — CBC
HEMATOCRIT: 38.2 % (ref 36.0–46.0)
Hemoglobin: 12.7 g/dL (ref 12.0–15.0)
MCH: 28.2 pg (ref 26.0–34.0)
MCHC: 33.2 g/dL (ref 30.0–36.0)
MCV: 84.9 fL (ref 78.0–100.0)
Platelets: 331 10*3/uL (ref 150–400)
RBC: 4.5 MIL/uL (ref 3.87–5.11)
RDW: 13.9 % (ref 11.5–15.5)
WBC: 7.2 10*3/uL (ref 4.0–10.5)

## 2015-01-25 LAB — RAPID URINE DRUG SCREEN, HOSP PERFORMED
AMPHETAMINES: NOT DETECTED
BARBITURATES: NOT DETECTED
BENZODIAZEPINES: POSITIVE — AB
Cocaine: NOT DETECTED
OPIATES: NOT DETECTED
TETRAHYDROCANNABINOL: NOT DETECTED

## 2015-01-25 LAB — COMPREHENSIVE METABOLIC PANEL
ALK PHOS: 69 U/L (ref 38–126)
ALT: 30 U/L (ref 14–54)
AST: 28 U/L (ref 15–41)
Albumin: 4.4 g/dL (ref 3.5–5.0)
Anion gap: 9 (ref 5–15)
BILIRUBIN TOTAL: 0.4 mg/dL (ref 0.3–1.2)
BUN: 6 mg/dL (ref 6–20)
CALCIUM: 9.4 mg/dL (ref 8.9–10.3)
CO2: 29 mmol/L (ref 22–32)
Chloride: 97 mmol/L — ABNORMAL LOW (ref 101–111)
Creatinine, Ser: 0.84 mg/dL (ref 0.44–1.00)
GFR calc Af Amer: >60 mL/min (ref >60)
Glucose, Bld: 145 mg/dL — ABNORMAL HIGH (ref 65–99)
POTASSIUM: 4.1 mmol/L (ref 3.5–5.1)
Sodium: 135 mmol/L (ref 135–145)
TOTAL PROTEIN: 8.1 g/dL (ref 6.5–8.1)

## 2015-01-25 LAB — ACETAMINOPHEN LEVEL

## 2015-01-25 LAB — SALICYLATE LEVEL: Salicylate Lvl: <4 mg/dL (ref 2.8–30.0)

## 2015-01-25 LAB — ETHANOL: Alcohol, Ethyl (B): <5 mg/dL (ref <5)

## 2015-01-25 MED ORDER — LORAZEPAM 1 MG PO TABS: 1.0000 mg | ORAL_TABLET | Freq: Three times a day (TID) | ORAL | Status: DC | PRN

## 2015-01-25 MED ORDER — CITALOPRAM HYDROBROMIDE 10 MG PO TABS
40.0000 mg | ORAL_TABLET | Freq: Every day | ORAL | Status: DC
  Administered 2015-01-26: 40 mg via ORAL
  Filled 2015-01-25: qty 4

## 2015-01-25 MED ORDER — ONDANSETRON HCL 4 MG PO TABS: 4.0000 mg | ORAL_TABLET | Freq: Three times a day (TID) | ORAL | Status: DC | PRN

## 2015-01-25 MED ORDER — LISINOPRIL 10 MG PO TABS
10.0000 mg | ORAL_TABLET | Freq: Every day | ORAL | Status: DC
  Administered 2015-01-26: 10 mg via ORAL
  Filled 2015-01-25: qty 1

## 2015-01-25 MED ORDER — ASPIRIN 325 MG PO TABS
650.0000 mg | ORAL_TABLET | Freq: Every day | ORAL | Status: DC
  Administered 2015-01-25: 650 mg via ORAL
  Filled 2015-01-25: qty 2

## 2015-01-25 MED ORDER — ALUM & MAG HYDROXIDE-SIMETH 200-200-20 MG/5ML PO SUSP: 30.0000 mL | ORAL | Status: DC | PRN

## 2015-01-25 MED ORDER — ALPRAZOLAM 0.5 MG PO TABS: 1.0000 mg | ORAL_TABLET | Freq: Two times a day (BID) | ORAL | Status: DC | PRN

## 2015-01-25 MED ORDER — CYCLOBENZAPRINE HCL 10 MG PO TABS
10.0000 mg | ORAL_TABLET | Freq: Three times a day (TID) | ORAL | Status: DC | PRN
  Administered 2015-01-26: 10 mg via ORAL
  Filled 2015-01-25: qty 1

## 2015-01-25 MED ORDER — ACETAMINOPHEN 325 MG PO TABS: 650.0000 mg | ORAL_TABLET | ORAL | Status: DC | PRN

## 2015-01-25 MED ORDER — LEVOTHYROXINE SODIUM 75 MCG PO TABS
75.0000 ug | ORAL_TABLET | Freq: Every day | ORAL | Status: DC
  Administered 2015-01-26: 75 ug via ORAL
  Filled 2015-01-25 (×2): qty 1

## 2015-01-25 MED ORDER — GABAPENTIN 300 MG PO CAPS
300.0000 mg | ORAL_CAPSULE | Freq: Three times a day (TID) | ORAL | Status: DC
  Administered 2015-01-25 – 2015-01-26 (×2): 300 mg via ORAL
  Filled 2015-01-25 (×2): qty 1

## 2015-01-25 MED ORDER — ZOLPIDEM TARTRATE 5 MG PO TABS: 5.0000 mg | ORAL_TABLET | Freq: Every evening | ORAL | Status: DC | PRN

## 2015-01-25 MED ORDER — NICOTINE 21 MG/24HR TD PT24
21.0000 mg | MEDICATED_PATCH | Freq: Every day | TRANSDERMAL | Status: DC
  Filled 2015-01-25 (×2): qty 1

## 2015-01-25 MED ORDER — IBUPROFEN 400 MG PO TABS: 600.0000 mg | ORAL_TABLET | Freq: Three times a day (TID) | ORAL | Status: DC | PRN

## 2015-01-25 MED ORDER — PRAVASTATIN SODIUM 20 MG PO TABS
20.0000 mg | ORAL_TABLET | Freq: Every day | ORAL | Status: DC
  Filled 2015-01-25: qty 1

## 2015-01-25 NOTE — Progress Notes (Addendum)
Patient referred for IP psych treatment at: [facility] - per intake, fax for review. [facility]- per Fraser Din, going through referrals right now, ok to refax. [facility]- per Landa, fax it. [facility]- per Olin Hauser, fax referral. [facility] - per intake, fax it. [facility] - per Danae Chen, fax referral. [facility] - per intake, fax it for waitlist. [facility]   At capacity: [facility]- per Aimme, call back in 1 h. Per Antony Madura, 1 adult female bed only. [facility] - per Ron, call back at 8am for bed status.  CSW will continue to seek placement.  Verlon Setting, [facility] Disposition staff 01/25/2015 11:34 PM
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
## 2015-01-25 NOTE — ED Notes (Signed)
[place] Sand[place] Provided.Marland KitchenMarland Kitchen
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
## 2015-01-25 NOTE — ED Notes (Signed)
Pt's spouse is no longer at bedside

## 2015-01-25 NOTE — ED Notes (Signed)
[place]
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
## 2015-01-25 NOTE — ED Notes (Signed)
PA at bedside.

## 2015-01-25 NOTE — ED Notes (Signed)
TTS in process 
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
## 2015-01-26 NOTE — ED Notes (Signed)
[facility] transport called  

## 2015-01-26 NOTE — Progress Notes (Addendum)
Patient husband and patient daughter both aware of plan and agreeable.  LCSW received call from [facility] that patient has been accepted to [facility]. Met with patient and daughter at the bedside.  Patient agreeable to sign herself into facility. Daughter at the bedside requesting patient have her pain medication however LCSW explained that medication will be administered at facility rather than the emergency room. Daughter tearful and agitated, however understanding. Patient very calm and agreeable to plan.   Patient to be admitted to [facility] and transported by [facility].  Lane Hacker, MSW Clinical Social Work: Emergency Room [phone]

## 2015-01-26 NOTE — ED Notes (Signed)
Per patient- husband took all patient belongings home.

## 2015-01-26 NOTE — BH Assessment (Signed)
Pt is on the [facility] wait list at [place], [facility] Triage Specialist 01/26/2015 3:33 AM

## 2015-01-26 NOTE — Progress Notes (Signed)
Per Cletus Gash at [facility], pt accepted by Dr. Reece Levy to [facility] A unit. Call report to [phone]. Pt is currently voluntary. Cletus Gash notes that if pt is unwilling to sign in voluntarily, she would need to be under IVC prior to transport.   Spoke with [facility] SW re: placement.   Sharren Bridge, MSW, [place] Clinical Social Work, Disposition  01/26/2015 [phone]

## 2015-02-09 ENCOUNTER — Other Ambulatory Visit (HOSPITAL_COMMUNITY): Payer: Self-pay | Admitting: Physician Assistant

## 2015-02-09 DIAGNOSIS — Z1231 Encounter for screening mammogram for malignant neoplasm of breast: Secondary | ICD-10-CM

## 2015-02-16 ENCOUNTER — Emergency Department (HOSPITAL_COMMUNITY)
Admission: EM | Admit: 2015-02-16 | Discharge: 2015-02-16 | Disposition: A | Discharge status: Home / Self Care | Payer: Self-pay | Attending: Emergency Medicine | Admitting: Emergency Medicine

## 2015-02-16 ENCOUNTER — Ambulatory Visit (HOSPITAL_COMMUNITY)
Admission: RE | Admit: 2015-02-16 | Discharge: 2015-02-16 | Disposition: A | Discharge status: Home / Self Care | Payer: Self-pay | Source: Ambulatory Visit | Attending: Physician Assistant | Admitting: Physician Assistant

## 2015-02-16 ENCOUNTER — Encounter (HOSPITAL_COMMUNITY): Payer: Self-pay

## 2015-02-16 ENCOUNTER — Emergency Department (HOSPITAL_COMMUNITY): Payer: Self-pay

## 2015-02-16 DIAGNOSIS — F419 Anxiety disorder, unspecified: Secondary | ICD-10-CM | POA: Insufficient documentation

## 2015-02-16 DIAGNOSIS — Z1231 Encounter for screening mammogram for malignant neoplasm of breast: Secondary | ICD-10-CM | POA: Insufficient documentation

## 2015-02-16 DIAGNOSIS — W1839XA Other fall on same level, initial encounter: Secondary | ICD-10-CM | POA: Insufficient documentation

## 2015-02-16 DIAGNOSIS — Y9289 Other specified places as the place of occurrence of the external cause: Secondary | ICD-10-CM | POA: Insufficient documentation

## 2015-02-16 DIAGNOSIS — Z7982 Long term (current) use of aspirin: Secondary | ICD-10-CM | POA: Insufficient documentation

## 2015-02-16 DIAGNOSIS — Z87891 Personal history of nicotine dependence: Secondary | ICD-10-CM | POA: Insufficient documentation

## 2015-02-16 DIAGNOSIS — E079 Disorder of thyroid, unspecified: Secondary | ICD-10-CM | POA: Insufficient documentation

## 2015-02-16 DIAGNOSIS — Y9389 Activity, other specified: Secondary | ICD-10-CM | POA: Insufficient documentation

## 2015-02-16 DIAGNOSIS — Y998 Other external cause status: Secondary | ICD-10-CM | POA: Insufficient documentation

## 2015-02-16 DIAGNOSIS — E785 Hyperlipidemia, unspecified: Secondary | ICD-10-CM | POA: Insufficient documentation

## 2015-02-16 DIAGNOSIS — I10 Essential (primary) hypertension: Secondary | ICD-10-CM | POA: Insufficient documentation

## 2015-02-16 DIAGNOSIS — G8929 Other chronic pain: Secondary | ICD-10-CM | POA: Insufficient documentation

## 2015-02-16 DIAGNOSIS — Z79899 Other long term (current) drug therapy: Secondary | ICD-10-CM | POA: Insufficient documentation

## 2015-02-16 DIAGNOSIS — S300XXA Contusion of lower back and pelvis, initial encounter: Secondary | ICD-10-CM | POA: Insufficient documentation

## 2015-02-16 MED ORDER — KETOROLAC TROMETHAMINE 60 MG/2ML IM SOLN
60.0000 mg | Freq: Once | INTRAMUSCULAR | Status: AC
  Administered 2015-02-16: 60 mg via INTRAMUSCULAR
  Filled 2015-02-16: qty 2
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
## 2015-02-16 NOTE — ED Provider Notes (Signed)
CSN: [id number]     Arrival date & time 02/16/15  0927 History   First MD Initiated Contact with Patient 02/16/15 [phone]     Chief Complaint  Patient presents with  . Fall     (Consider location/radiation/quality/duration/timing/severity/associated sxs/prior Treatment) HPI  Brittany Lynch is a 54 y.o. female with chronic low back pain, presents to the Emergency Department complaining of low back pain as a result of a mechancial fall that occurred yesterday.  States that she slipped, fell landing on her buttocks.  Today, as woke with pain to her lower back and radiating into both legs.  She describes the pain as aching and sharp.  Worse with movement.  She took hydrocodone prior to arrival with significant pain relief.  She denies other symptoms, urine or bladder changes, abdominal pain, numbness or weakness of the extremities, fever, chills     Past Medical History  Diagnosis Date  . Thyroid disease   . Hypertension   . Hyperlipidemia   . Anxiety    Past Surgical History  Procedure Laterality Date  . Spine surgery    . Neck surgery    . Abdominal hysterectomy     Family History  Problem Relation Age of Onset  . Alcohol abuse Father    History  Substance Use Topics  . Smoking status: Former Smoker    Quit date: 07/16/2001  . Smokeless tobacco: Not on file  . Alcohol Use: No   OB History    No data available     Review of Systems  Constitutional: Negative for fever.  Respiratory: Negative for shortness of breath.   Gastrointestinal: Negative for vomiting, abdominal pain and constipation.  Genitourinary: Negative for dysuria, hematuria, flank pain, decreased urine volume and difficulty urinating.  Musculoskeletal: Positive for back pain. Negative for joint swelling.  Skin: Negative for rash.  Neurological: Negative for weakness and numbness.  All other systems reviewed and are negative.     Allergies  Iodinated diagnostic agents; Methotrexate derivatives; and  Tape  Home Medications   Prior to Admission medications   Medication Sig Start Date End Date Taking? Authorizing Provider  ALPRAZolam Duanne Moron) 1 MG tablet Take 1 tablet (1 mg total) by mouth at bedtime as needed for anxiety. Patient taking differently: Take 1 mg by mouth 2 (two) times daily as needed for anxiety.  09/20/14   Sharion Balloon, FNP  aspirin 325 MG tablet Take 650 mg by mouth at bedtime.     Historical Provider, MD  citalopram (CELEXA) 40 MG tablet TAKE ONE TABLET BY MOUTH ONCE DAILY MUST  HAVE  OFFICE  VISIT  FOR  ADDITIONAL  REFILLS 09/20/14   Sharion Balloon, FNP  cyclobenzaprine (FLEXERIL) 10 MG tablet Take 10 mg by mouth every 8 (eight) hours as needed for muscle spasms.    Eustace Moore, MD  gabapentin (NEURONTIN) 300 MG capsule Take 300 mg by mouth 3 (three) times daily.    Historical Provider, MD  levothyroxine (SYNTHROID, LEVOTHROID) 75 MCG tablet Take 1 tablet (75 mcg total) by mouth daily before breakfast. 09/20/14   Sharion Balloon, FNP  lisinopril (PRINIVIL,ZESTRIL) 10 MG tablet Take 1 tablet (10 mg total) by mouth daily. 09/20/14   Sharion Balloon, FNP  lovastatin (MEVACOR) 20 MG tablet Take 2 tablets (40 mg total) by mouth at bedtime. 09/20/14   Sharion Balloon, FNP  Menthol, Topical Analgesic, (ICY HOT EX) Apply 1 patch topically daily.    Historical Provider, MD  Misc  Natural Products (COLON CLEANSE) CAPS Take 1 capsule by mouth at bedtime.    Historical Provider, MD  Multiple Vitamin (MULTIVITAMIN) tablet Take 1 tablet by mouth daily.    Historical Provider, MD  oxyCODONE-acetaminophen (PERCOCET/ROXICET) 5-325 MG per tablet Take 1-2 tablets by mouth every 6 (six) hours as needed for severe pain.    Eustace Moore, MD   BP 125/84 mmHg  Pulse 79  Temp(Src) 98.7 F (37.1 C) (Oral)  Resp 18  Ht 5\' 2"  (1.575 m)  Wt 170 lb (77.111 kg)  BMI 31.09 kg/m2  SpO2 97% Physical Exam  Constitutional: She is oriented to person, place, and time. She appears well-developed and  well-nourished. No distress.  HENT:  Head: Normocephalic and atraumatic.  Neck: Normal range of motion. Neck supple.  Cardiovascular: Normal rate, regular rhythm, normal heart sounds and intact distal pulses.   No murmur heard. Pulmonary/Chest: Effort normal and breath sounds normal. No respiratory distress.  Abdominal: Soft. She exhibits no distension. There is no tenderness.  Musculoskeletal: She exhibits tenderness. She exhibits no edema.       Lumbar back: She exhibits tenderness and pain. She exhibits normal range of motion, no swelling, no deformity, no laceration and normal pulse.  ttp of the lumbar spine and bilateral paraspinal muscles.   DP pulses are brisk and symmetrical.  Distal sensation intact.  + SLR 30 degrees bilaterally.  Pt has 5/5 strength against resistance of bilateral lower extremities.     Neurological: She is alert and oriented to person, place, and time. She has normal strength. No sensory deficit. She exhibits normal muscle tone. Coordination and gait normal.  Reflex Scores:      Patellar reflexes are 2+ on the right side and 2+ on the left side.      Achilles reflexes are 2+ on the right side and 2+ on the left side. Skin: Skin is warm and dry. No rash noted.  Nursing note and vitals reviewed.   ED Course  Procedures (including critical care time) Labs Review Labs Reviewed - No data to display  Imaging Review Dg Lumbar Spine Complete  02/16/2015   CLINICAL DATA:  Pain following fall 1 day prior with radicular symptoms bilaterally  EXAM: LUMBAR SPINE - COMPLETE 4+ VIEW  COMPARISON:  November 06, 2013  FINDINGS: Frontal, lateral, spot lumbosacral lateral, and bilateral oblique views were obtained. There are 6 non-rib-bearing lumbar type vertebral bodies. There is an assimilation joint on the right at L6. There is no fracture or spondylolisthesis. There is mild disc space narrowing at L5-S1. There is no appreciable facet osteoarthritic change.  IMPRESSION: Mild disc  space narrowing at L5-6. Other disc spaces appear unremarkable. Note that the L6 vertebra is transitional with an assimilation joint on the right. No fracture or spondylolisthesis.   Electronically Signed   By: Lowella Grip III M.D.   On: 02/16/2015 10:36     EKG Interpretation None      MDM   Final diagnoses:  Lumbar contusion, initial encounter   Patient with chronic back pain, reports increased pain with radicular pain to BLE's after a fall.  XR neg for acute bony injury.  Pt feeling better after IM toradol.  Vitals remain stable.  No focal neuro deficits, ambulates with a steady gait.  No concerning sx's for emergent neurological process. Pt agrees to close f/u with PMD.       Kem Parkinson, PA-C 02/16/15 2007  Fredia Sorrow, MD 02/17/15 [phone]

## 2015-04-15 ENCOUNTER — Other Ambulatory Visit: Payer: Self-pay | Admitting: Physician Assistant

## 2015-04-15 DIAGNOSIS — E041 Nontoxic single thyroid nodule: Secondary | ICD-10-CM

## 2015-04-19 ENCOUNTER — Encounter: Payer: Self-pay | Admitting: Physician Assistant

## 2015-04-19 ENCOUNTER — Ambulatory Visit: Payer: Self-pay | Admitting: Physician Assistant

## 2015-04-19 VITALS — BP 128/84 | HR 105 | Temp 98.1°F | Ht 61.75 in | Wt 178.0 lb

## 2015-04-19 DIAGNOSIS — R8290 Unspecified abnormal findings in urine: Secondary | ICD-10-CM

## 2015-04-19 DIAGNOSIS — R300 Dysuria: Secondary | ICD-10-CM

## 2015-04-19 LAB — POCT URINALYSIS DIPSTICK
Bilirubin, UA: NEGATIVE
Blood, UA: NEGATIVE
GLUCOSE UA: NEGATIVE
Ketones, UA: NEGATIVE
Nitrite, UA: NEGATIVE
PROTEIN UA: NEGATIVE
SPEC GRAV UA: 1.01
Urobilinogen, UA: 0.2
pH, UA: 7.5

## 2015-04-19 NOTE — Progress Notes (Signed)
   BP 128/84 mmHg  Pulse 105  Temp(Src) 98.1 F (36.7 C)  Ht 5' 1.75" (1.568 m)  Wt 178 lb (80.74 kg)  BMI 32.84 kg/m2  SpO2 97%   Subjective:    Patient ID: Brittany Lynch, female    DOB: [birth year], 54 y.o.   MRN: [id number]  HPI: Brittany Lynch is a 54 y.o. female presenting on 04/19/2015 for Urinary Tract Infection and Wheezing   HPI   Chief Complaint  Patient presents with  . Urinary Tract Infection    pt states s/sx started 5 days ago. pt states she feels she needs to urinate frequently. pt states for the last 2 days she saw blood when she wiped. pt states it burns to urinate and feels a lot of pressure. pt states she has lower back pain, but does not think it is related to her problem. pt states she thinks she may have passed a kidney stone since she hsaw blood.  . Wheezing    pt states she noticed wheezing on friday. pt states she felt she has acid reflux and choked pt states she felt like she was having upper respiratory problems with coughing. pt states she took alkazetzher plus, and she felt it was helpful. pt states she feels she has something in her throat, and feels she cannot get it to come out.      Relevant past medical, surgical, family and social history reviewed and updated as indicated. Interim medical history since our last visit reviewed. Allergies and medications reviewed and updated.  Review of Systems  Per HPI unless specifically indicated above     Objective:    BP 128/84 mmHg  Pulse 105  Temp(Src) 98.1 F (36.7 C)  Ht 5' 1.75" (1.568 m)  Wt 178 lb (80.74 kg)  BMI 32.84 kg/m2  SpO2 97%  Wt Readings from Last 3 Encounters:  04/19/15 178 lb (80.74 kg)  02/16/15 170 lb (77.111 kg)  01/25/15 164 lb (74.39 kg)    Physical Exam  Constitutional: She appears well-developed and well-nourished.  Neck: Neck supple.  Cardiovascular: Normal rate and regular rhythm.   Pulmonary/Chest: Effort normal and breath sounds normal. She has no wheezes.   Abdominal: Soft. There is no tenderness.  Lymphadenopathy:    She has no cervical adenopathy.  Skin: Skin is warm and dry.  Vitals reviewed.   UA unremarkable     Assessment & Plan:  Dysuria  Urine sent for c&s  Pt counseled to Drink plenty of fluids We will call pt with urine culture results Heat to back for pain Take tylenol of Ibuprofen as needed for pain Follow up oct 18 as scheduled

## 2015-04-19 NOTE — Patient Instructions (Addendum)
Drink plenty of fluids We will call you with urine culture results Heat to back for pain Take tylenol of Ibuprofen as needed for pain Follow up oct 18 as scheduled

## 2015-04-20 ENCOUNTER — Ambulatory Visit (HOSPITAL_COMMUNITY): Payer: Self-pay

## 2015-04-22 LAB — URINE CULTURE: Colony Count: >=100000

## 2015-04-25 ENCOUNTER — Other Ambulatory Visit: Payer: Self-pay | Admitting: Physician Assistant

## 2015-04-25 DIAGNOSIS — N3090 Cystitis, unspecified without hematuria: Secondary | ICD-10-CM

## 2015-04-25 MED ORDER — CIPROFLOXACIN HCL 500 MG PO TABS: 500.0000 mg | ORAL_TABLET | Freq: Two times a day (BID) | ORAL | Status: AC

## 2015-04-25 NOTE — Progress Notes (Signed)
Called pt to notify her of infection in urine, told her PA-C prescribed cipro and sent prescription to [facility] in [place]. Pt understood.

## 2015-05-02 ENCOUNTER — Encounter: Payer: Self-pay | Admitting: Physician Assistant

## 2015-05-02 ENCOUNTER — Ambulatory Visit: Payer: Self-pay | Admitting: Physician Assistant

## 2015-05-02 VITALS — BP 132/86 | HR 103 | Temp 98.2°F | Ht 62.5 in | Wt 177.8 lb

## 2015-05-02 DIAGNOSIS — E039 Hypothyroidism, unspecified: Secondary | ICD-10-CM

## 2015-05-02 DIAGNOSIS — I10 Essential (primary) hypertension: Secondary | ICD-10-CM

## 2015-05-02 DIAGNOSIS — E785 Hyperlipidemia, unspecified: Secondary | ICD-10-CM

## 2015-05-02 DIAGNOSIS — E079 Disorder of thyroid, unspecified: Secondary | ICD-10-CM

## 2015-05-02 DIAGNOSIS — R300 Dysuria: Secondary | ICD-10-CM

## 2015-05-02 LAB — POCT URINALYSIS DIPSTICK
Bilirubin, UA: NEGATIVE
Glucose, UA: NEGATIVE
Ketones, UA: NEGATIVE
Leukocytes, UA: NEGATIVE
Nitrite, UA: NEGATIVE
PH UA: 7
Protein, UA: NEGATIVE
Spec Grav, UA: <=1.005
UROBILINOGEN UA: 0.2

## 2015-05-02 NOTE — Patient Instructions (Addendum)
Finish cipro antibiotic  Return to office if urine symptoms persist after completion of medication

## 2015-05-02 NOTE — Progress Notes (Signed)
BP 132/86 mmHg  Pulse 103  Temp(Src) 98.2 F (36.8 C)  Ht 5' 2.5" (1.588 m)  Wt 177 lb 12.8 oz (80.65 kg)  BMI 31.98 kg/m2  SpO2 99%   Subjective:    Patient ID: Brittany Lynch, female    DOB: Jul 22, 1960, 54 y.o.   MRN: [id number]  HPI: Brittany Lynch is a 54 y.o. female presenting on 05/02/2015 for Thyroid Problem; Hyperlipidemia; Gastroesophageal Reflux; and Dysuria   HPI   Chief Complaint  Patient presents with  . Thyroid Problem  . Hyperlipidemia  . Gastroesophageal Reflux  . Dysuria    pt thinks she may need to check her urine since last time there was infection in her urine. pt states she is not urinating frequently, but she does feel a little burning and itching when she urinates   Pt got her new Rx from [facility].  She is doing well with her meds. She is doing well- states no more cough Pt says she has one more dose of cipro given for + uirine culture Pt had thyroid US scheduled but pt canceled.  Pt says she turned [facility] application in early august  Relevant past medical, surgical, family and social history reviewed and updated as indicated. Interim medical history since our last visit reviewed. Allergies and medications reviewed and updated.   Current outpatient prescriptions:  .  ALPRAZolam (XANAX) 1 MG tablet, Take 1 tablet (1 mg total) by mouth at bedtime as needed for anxiety. (Patient taking differently: Take 1 mg by mouth 2 (two) times daily as needed for anxiety. ), Disp: 60 tablet, Rfl: 3 .  amLODipine (NORVASC) 10 MG tablet, Take 10 mg by mouth daily., Disp: , Rfl:  .  ciprofloxacin (CIPRO) 500 MG tablet, Take 500 mg by mouth 2 (two) times daily., Disp: , Rfl:  .  cyclobenzaprine (FLEXERIL) 10 MG tablet, Take 10 mg by mouth every 8 (eight) hours as needed for muscle spasms., Disp: , Rfl:  .  FLUoxetine (PROZAC) 20 MG tablet, Take 60 mg by mouth daily. , Disp: , Rfl:  .  gabapentin (NEURONTIN) 300 MG capsule, Take 400 mg by mouth 4 (four) times  daily. , Disp: , Rfl:  .  hydrOXYzine (VISTARIL) 25 MG capsule, Take 25 mg by mouth every 12 (twelve) hours as needed., Disp: , Rfl:  .  HYDROXYZINE HCL PO, Take by mouth 3 (three) times daily as needed., Disp: , Rfl:  .  levothyroxine (SYNTHROID, LEVOTHROID) 75 MCG tablet, Take 1 tablet (75 mcg total) by mouth daily before breakfast., Disp: 90 tablet, Rfl: 4 .  Menthol, Topical Analgesic, (ICY HOT EX), Apply 1 patch topically daily as needed (pain). , Disp: , Rfl:  .  Misc Natural Products (COLON CLEANSE) CAPS, Take 1 capsule by mouth at bedtime., Disp: , Rfl:  .  Multiple Vitamin (MULTIVITAMIN) tablet, Take 1 tablet by mouth daily., Disp: , Rfl:  .  omeprazole (PRILOSEC) 40 MG capsule, Take 40 mg by mouth daily., Disp: , Rfl:  .  oxyCODONE-acetaminophen (PERCOCET/ROXICET) 5-325 MG per tablet, Take 1-2 tablets by mouth every 6 (six) hours as needed for severe pain., Disp: , Rfl:  .  rosuvastatin (CRESTOR) 20 MG tablet, Take 20 mg by mouth at bedtime., Disp: , Rfl:  .  aspirin 325 MG tablet, Take 650 mg by mouth at bedtime. , Disp: , Rfl:    Review of Systems  Constitutional: Negative for fever and chills.  Respiratory: Negative for cough and shortness of breath.  Cardiovascular: Negative for chest pain and leg swelling.  Genitourinary: Positive for difficulty urinating.  Psychiatric/Behavioral: Positive for agitation. The patient is nervous/anxious.       Per HPI unless specifically indicated above     Objective:    BP 132/86 mmHg  Pulse 103  Temp(Src) 98.2 F (36.8 C)  Ht 5' 2.5" (1.588 m)  Wt 177 lb 12.8 oz (80.65 kg)  BMI 31.98 kg/m2  SpO2 99%  Wt Readings from Last 3 Encounters:  05/02/15 177 lb 12.8 oz (80.65 kg)  04/19/15 178 lb (80.74 kg)  02/16/15 170 lb (77.111 kg)    Physical Exam  Constitutional: She is oriented to person, place, and time. She appears well-developed and well-nourished.  HENT:  Head: Normocephalic and atraumatic.  Neck: Neck supple.   Cardiovascular: Normal rate and regular rhythm.   Pulmonary/Chest: Effort normal and breath sounds normal.  Abdominal: Soft. Bowel sounds are normal. She exhibits no mass. There is no tenderness.  Musculoskeletal: She exhibits no edema.  Lymphadenopathy:    She has no cervical adenopathy.  Neurological: She is alert and oriented to person, place, and time.  Skin: Skin is warm and dry.  Vitals reviewed.   Results for orders placed or performed in visit on 04/19/15  Urine Culture  Result Value Ref Range   Culture ESCHERICHIA COLI    Colony Count >=100,000 COLONIES/ML    Organism ID, Bacteria ESCHERICHIA COLI       Susceptibility   Escherichia coli -  (no method available)    AMPICILLIN <=2 Sensitive     AMOX/CLAVULANIC <=2 Sensitive     AMPICILLIN/SULBACTAM <=2 Sensitive     PIP/TAZO <=4 Sensitive     IMIPENEM <=0.25 Sensitive     CEFAZOLIN <=4 Not Reportable     CEFTRIAXONE <=1 Sensitive     CEFTAZIDIME <=1 Sensitive     CEFEPIME <=1 Sensitive     GENTAMICIN <=1 Sensitive     TOBRAMYCIN <=1 Sensitive     CIPROFLOXACIN <=0.25 Sensitive     LEVOFLOXACIN <=0.12 Sensitive     NITROFURANTOIN <=16 Sensitive     TRIMETH/SULFA* <=20 Sensitive      * NR=NOT REPORTABLE,SEE COMMENTORAL therapy:A cefazolin MIC of <32 predicts susceptibility to the oral agents cefaclor,cefdinir,cefpodoxime,cefprozil,cefuroxime,cephalexin,and loracarbef when used for therapy of uncomplicated UTIs due to E.coli,K.pneumomiae,and P.mirabilis. PARENTERAL therapy: A cefazolinMIC of >8 indicates resistance to parenteralcefazolin. An alternate test method must beperformed to confirm susceptibility to parenteralcefazolin.  POCT Urinalysis Dipstick  Result Value Ref Range   Color, UA yellow    Clarity, UA clear    Glucose, UA negative    Bilirubin, UA negatve    Ketones, UA neg    Spec Grav, UA 1.010    Blood, UA neg    pH, UA 7.5    Protein, UA neg    Urobilinogen, UA 0.2    Nitrite, UA neg    Leukocytes,  UA Trace (A) Negative      Assessment & Plan:    Encounter Diagnoses  Name Primary?  . Dysuria Yes  . Essential hypertension   . Hypothyroidism, unspecified hypothyroidism type   . Hyperlipidemia        Check on [facility] and get thyroid US rescheduled. F/u 6 wk to recheck labs- lipids and tsh

## 2015-05-03 ENCOUNTER — Ambulatory Visit: Payer: Self-pay | Admitting: Physician Assistant

## 2015-05-18 ENCOUNTER — Telehealth: Payer: Self-pay | Admitting: Student

## 2015-05-18 NOTE — Telephone Encounter (Signed)
Called pt and notified her of [place] on Wed. Nov. 9, 2016 at 2 pm. Pt understood.

## 2015-05-23 ENCOUNTER — Encounter: Payer: Self-pay | Admitting: Physician Assistant

## 2015-05-23 ENCOUNTER — Ambulatory Visit: Payer: Self-pay | Admitting: Physician Assistant

## 2015-05-23 VITALS — BP 122/80 | HR 103 | Temp 97.9°F | Ht 62.5 in | Wt 176.5 lb

## 2015-05-23 DIAGNOSIS — R609 Edema, unspecified: Secondary | ICD-10-CM

## 2015-05-23 DIAGNOSIS — I10 Essential (primary) hypertension: Secondary | ICD-10-CM

## 2015-05-23 MED ORDER — METOPROLOL TARTRATE 50 MG PO TABS: 50.0000 mg | ORAL_TABLET | Freq: Two times a day (BID) | ORAL | Status: DC

## 2015-05-23 NOTE — Progress Notes (Signed)
BP 122/80 mmHg  Pulse 103  Temp(Src) 97.9 F (36.6 C)  Ht 5' 2.5" (1.588 m)  Wt 176 lb 8 oz (80.06 kg)  BMI 31.75 kg/m2  SpO2 98%   Subjective:    Patient ID: Brittany Lynch, female    DOB: [birth year], 54 y.o.   MRN: [id number]  HPI: Brittany Lynch is a 54 y.o. female presenting on 05/23/2015 for Leg Swelling   HPI Pt states LE swelling started the day after her last OV. No sob or cp or other symptom.  Relevant past medical, surgical, family and social history reviewed and updated as indicated. Interim medical history since our last visit reviewed. Allergies and medications reviewed and updated.  Current outpatient prescriptions:  .  ALPRAZolam (XANAX) 1 MG tablet, Take 1 tablet (1 mg total) by mouth at bedtime as needed for anxiety. (Patient taking differently: Take 1 mg by mouth 2 (two) times daily as needed for anxiety. ), Disp: 60 tablet, Rfl: 3 .  amLODipine (NORVASC) 10 MG tablet, Take 10 mg by mouth daily., Disp: , Rfl:  .  cyclobenzaprine (FLEXERIL) 10 MG tablet, Take 10 mg by mouth every 8 (eight) hours as needed for muscle spasms., Disp: , Rfl:  .  diclofenac (VOLTAREN) 75 MG EC tablet, Take 75 mg by mouth every 12 (twelve) hours as needed., Disp: , Rfl:  .  FLUoxetine (PROZAC) 20 MG tablet, Take 60 mg by mouth daily. , Disp: , Rfl:  .  gabapentin (NEURONTIN) 300 MG capsule, Take 400 mg by mouth 4 (four) times daily. , Disp: , Rfl:  .  HYDROXYZINE HCL PO, Take 25 mg by mouth 3 (three) times daily as needed. , Disp: , Rfl:  .  levothyroxine (SYNTHROID, LEVOTHROID) 75 MCG tablet, Take 1 tablet (75 mcg total) by mouth daily before breakfast., Disp: 90 tablet, Rfl: 4 .  Menthol, Topical Analgesic, (ICY HOT EX), Apply 1 patch topically daily as needed (pain). , Disp: , Rfl:  .  Multiple Vitamin (MULTIVITAMIN) tablet, Take 1 tablet by mouth daily., Disp: , Rfl:  .  omeprazole (PRILOSEC) 40 MG capsule, Take 40 mg by mouth daily., Disp: , Rfl:  .  oxyCODONE-acetaminophen  (PERCOCET/ROXICET) 5-325 MG per tablet, Take 1-2 tablets by mouth every 8 (eight) hours as needed for severe pain. , Disp: , Rfl:  .  rosuvastatin (CRESTOR) 20 MG tablet, Take 20 mg by mouth at bedtime., Disp: , Rfl:  .  aspirin 325 MG tablet, Take 650 mg by mouth at bedtime. , Disp: , Rfl:  .  hydrOXYzine (VISTARIL) 25 MG capsule, Take 25 mg by mouth 3 (three) times daily as needed. , Disp: , Rfl:  .  Misc Natural Products (COLON CLEANSE) CAPS, Take 1 capsule by mouth at bedtime., Disp: , Rfl:    Review of Systems  Constitutional: Positive for fatigue. Negative for fever, chills, diaphoresis, appetite change and unexpected weight change.  HENT: Positive for dental problem and facial swelling. Negative for congestion, drooling, ear pain, hearing loss, mouth sores, sneezing, sore throat, trouble swallowing and voice change.   Eyes: Negative for pain, discharge, redness, itching and visual disturbance.  Respiratory: Positive for shortness of breath. Negative for cough, choking and wheezing.   Cardiovascular: Positive for leg swelling. Negative for chest pain and palpitations.  Gastrointestinal: Negative for vomiting, abdominal pain, diarrhea, constipation and blood in stool.  Endocrine: Positive for polydipsia. Negative for cold intolerance and heat intolerance.  Genitourinary: Negative for dysuria, hematuria and decreased urine volume.  Musculoskeletal: Positive for back pain, arthralgias and gait problem.  Skin: Negative for rash.  Allergic/Immunologic: Negative for environmental allergies.  Neurological: Negative for seizures, syncope, light-headedness and headaches.  Hematological: Negative for adenopathy.  Psychiatric/Behavioral: Positive for dysphoric mood. Negative for suicidal ideas and agitation. The patient is nervous/anxious.     Per HPI unless specifically indicated above     Objective:    BP 122/80 mmHg  Pulse 103  Temp(Src) 97.9 F (36.6 C)  Ht 5' 2.5" (1.588 m)  Wt 176  lb 8 oz (80.06 kg)  BMI 31.75 kg/m2  SpO2 98%  Wt Readings from Last 3 Encounters:  05/23/15 176 lb 8 oz (80.06 kg)  05/02/15 177 lb 12.8 oz (80.65 kg)  04/19/15 178 lb (80.74 kg)    Physical Exam  Constitutional: She is oriented to person, place, and time. She appears well-developed and well-nourished.  HENT:  Head: Normocephalic and atraumatic.  Neck: Neck supple.  Cardiovascular: Normal rate and regular rhythm.   Pulmonary/Chest: Effort normal and breath sounds normal.  Abdominal: Soft. Bowel sounds are normal. She exhibits no mass. There is no tenderness.  Musculoskeletal: She exhibits edema (BLE with 1+ pitting edema).  Lymphadenopathy:    She has no cervical adenopathy.  Neurological: She is alert and oriented to person, place, and time.  Skin: Skin is warm and dry.  Psychiatric: She has a normal mood and affect. Her behavior is normal.  Vitals reviewed.       Assessment & Plan:   Encounter Diagnoses  Name Primary?  . Edema, unspecified type Yes  . Essential hypertension, benign      D/c amlodipine  rx metoprolol- rx sent to [facility]  F/u 11/28 as scheduled

## 2015-05-25 ENCOUNTER — Ambulatory Visit (HOSPITAL_COMMUNITY)
Admission: RE | Admit: 2015-05-25 | Discharge: 2015-05-25 | Disposition: A | Discharge status: Home / Self Care | Payer: Self-pay | Source: Ambulatory Visit | Attending: Physician Assistant | Admitting: Physician Assistant

## 2015-05-25 DIAGNOSIS — E079 Disorder of thyroid, unspecified: Secondary | ICD-10-CM | POA: Insufficient documentation

## 2015-05-25 DIAGNOSIS — E041 Nontoxic single thyroid nodule: Secondary | ICD-10-CM | POA: Insufficient documentation

## 2015-06-13 ENCOUNTER — Ambulatory Visit: Payer: Self-pay | Admitting: Physician Assistant

## 2015-06-13 ENCOUNTER — Encounter: Payer: Self-pay | Admitting: Physician Assistant

## 2015-06-13 VITALS — BP 126/78 | HR 74 | Temp 97.7°F | Ht 62.5 in | Wt 175.4 lb

## 2015-06-13 DIAGNOSIS — E039 Hypothyroidism, unspecified: Secondary | ICD-10-CM

## 2015-06-13 DIAGNOSIS — E785 Hyperlipidemia, unspecified: Secondary | ICD-10-CM

## 2015-06-13 DIAGNOSIS — I10 Essential (primary) hypertension: Secondary | ICD-10-CM

## 2015-06-13 NOTE — Progress Notes (Signed)
BP 126/78 mmHg  Pulse 74  Temp(Src) 97.7 F (36.5 C)  Ht 5' 2.5" (1.588 m)  Wt 175 lb 6.4 oz (79.561 kg)  BMI 31.55 kg/m2  SpO2 98%   Subjective:    Patient ID: Brittany Lynch, female    DOB: 08/21/1960, 54 y.o.   MRN: [id number]  HPI: Brittany Lynch is a 54 y.o. female presenting on 06/13/2015 for Hypertension; Hyperlipidemia; and Thyroid Problem   HPI  Chief Complaint  Patient presents with  . Hypertension    pt states she feels okay  . Hyperlipidemia  . Thyroid Problem    pt got ultrasound done and wants to know her results   [facility] for [place] She is doing well with metoprolol  Relevant past medical, surgical, family and social history reviewed and updated as indicated. Interim medical history since our last visit reviewed. Allergies and medications reviewed and updated.  Current outpatient prescriptions:  .  ALPRAZolam (XANAX) 1 MG tablet, Take 1 tablet (1 mg total) by mouth at bedtime as needed for anxiety. (Patient taking differently: Take 1 mg by mouth as needed for anxiety. ), Disp: 60 tablet, Rfl: 3 .  cyclobenzaprine (FLEXERIL) 10 MG tablet, Take 10 mg by mouth every 8 (eight) hours as needed for muscle spasms., Disp: , Rfl:  .  diclofenac (VOLTAREN) 75 MG EC tablet, Take 75 mg by mouth every 12 (twelve) hours as needed., Disp: , Rfl:  .  FLUoxetine (PROZAC) 20 MG tablet, Take 60 mg by mouth daily. , Disp: , Rfl:  .  gabapentin (NEURONTIN) 300 MG capsule, Take 400 mg by mouth 4 (four) times daily. , Disp: , Rfl:  .  HYDROXYZINE HCL PO, Take 25 mg by mouth 3 (three) times daily as needed. , Disp: , Rfl:  .  levothyroxine (SYNTHROID, LEVOTHROID) 75 MCG tablet, Take 1 tablet (75 mcg total) by mouth daily before breakfast., Disp: 90 tablet, Rfl: 4 .  Menthol, Topical Analgesic, (ICY HOT EX), Apply 1 patch topically daily as needed (pain). , Disp: , Rfl:  .  metoprolol (LOPRESSOR) 50 MG tablet, Take 1 tablet (50 mg total) by mouth 2 (two) times daily.,  Disp: 180 tablet, Rfl: 1 .  Misc Natural Products (COLON CLEANSE) CAPS, Take 1 capsule by mouth at bedtime., Disp: , Rfl:  .  Multiple Vitamin (MULTIVITAMIN) tablet, Take 1 tablet by mouth daily., Disp: , Rfl:  .  omeprazole (PRILOSEC) 40 MG capsule, Take 20 mg by mouth daily. , Disp: , Rfl:  .  oxyCODONE-acetaminophen (PERCOCET/ROXICET) 5-325 MG per tablet, Take 1-2 tablets by mouth every 8 (eight) hours as needed for severe pain. , Disp: , Rfl:  .  rosuvastatin (CRESTOR) 20 MG tablet, Take 20 mg by mouth at bedtime., Disp: , Rfl:    Review of Systems  Constitutional: Positive for diaphoresis and fatigue. Negative for fever, chills, appetite change and unexpected weight change.  HENT: Positive for dental problem. Negative for congestion, drooling, ear pain, facial swelling, hearing loss, mouth sores, sneezing, sore throat, trouble swallowing and voice change.   Eyes: Negative for pain, discharge, redness, itching and visual disturbance.  Respiratory: Negative for cough, choking, shortness of breath and wheezing.   Cardiovascular: Negative for chest pain, palpitations and leg swelling.  Gastrointestinal: Negative for vomiting, abdominal pain, diarrhea, constipation and blood in stool.  Endocrine: Negative for cold intolerance, heat intolerance and polydipsia.  Genitourinary: Negative for dysuria, hematuria and decreased urine volume.  Musculoskeletal: Positive for back pain,  arthralgias and gait problem.  Skin: Negative for rash.  Allergic/Immunologic: Negative for environmental allergies.  Neurological: Negative for seizures, syncope, light-headedness and headaches.  Hematological: Negative for adenopathy.  Psychiatric/Behavioral: Positive for dysphoric mood and agitation. Negative for suicidal ideas. The patient is nervous/anxious.     Per HPI unless specifically indicated above     Objective:    BP 126/78 mmHg  Pulse 74  Temp(Src) 97.7 F (36.5 C)  Ht 5' 2.5" (1.588 m)  Wt 175  lb 6.4 oz (79.561 kg)  BMI 31.55 kg/m2  SpO2 98%  Wt Readings from Last 3 Encounters:  06/13/15 175 lb 6.4 oz (79.561 kg)  05/23/15 176 lb 8 oz (80.06 kg)  05/02/15 177 lb 12.8 oz (80.65 kg)    Physical Exam  Constitutional: She is oriented to person, place, and time. She appears well-developed and well-nourished.  HENT:  Head: Normocephalic and atraumatic.  Neck: Neck supple.  Cardiovascular: Normal rate and regular rhythm.   Pulmonary/Chest: Effort normal and breath sounds normal.  Abdominal: Soft. Bowel sounds are normal. She exhibits no mass. There is no tenderness.  Musculoskeletal: She exhibits no edema.  Lymphadenopathy:    She has no cervical adenopathy.  Neurological: She is alert and oriented to person, place, and time.  Skin: Skin is warm and dry.  Psychiatric: She has a normal mood and affect. Her behavior is normal.  Vitals reviewed.      Assessment & Plan:   Encounter Diagnoses  Name Primary?  . Essential hypertension Yes  . Hypothyroidism, unspecified hypothyroidism type   . Hyperlipidemia     -reviewed [place] results with pt -check lipids and tsh this week- will call with results -F/u 3 mo. rto sooner prn

## 2015-06-15 LAB — COMPLETE METABOLIC PANEL WITH GFR
ALT: 25 U/L (ref 6–29)
AST: 23 U/L (ref 10–35)
Albumin: 4 g/dL (ref 3.6–5.1)
Alkaline Phosphatase: 79 U/L (ref 33–130)
BUN: 12 mg/dL (ref 7–25)
CALCIUM: 9 mg/dL (ref 8.6–10.4)
CHLORIDE: 102 mmol/L (ref 98–110)
CO2: 29 mmol/L (ref 20–31)
Creat: 0.6 mg/dL (ref 0.50–1.05)
GFR, Est African American: >89 mL/min (ref >=60)
GFR, Est Non African American: >89 mL/min (ref >=60)
Glucose, Bld: 101 mg/dL — ABNORMAL HIGH (ref 65–99)
Potassium: 4.5 mmol/L (ref 3.5–5.3)
Sodium: 138 mmol/L (ref 135–146)
TOTAL PROTEIN: 7.2 g/dL (ref 6.1–8.1)
Total Bilirubin: 0.3 mg/dL (ref 0.2–1.2)

## 2015-06-15 LAB — LIPID PANEL
CHOLESTEROL: 191 mg/dL (ref 125–200)
HDL: 57 mg/dL (ref >=46)
LDL Cholesterol: 86 mg/dL (ref <130)
Total CHOL/HDL Ratio: 3.4 Ratio (ref <=5.0)
Triglycerides: 242 mg/dL — ABNORMAL HIGH (ref <150)
VLDL: 48 mg/dL — AB (ref <30)

## 2015-06-15 LAB — TSH: TSH: 2.232 u[IU]/mL (ref 0.350–4.500)

## 2015-06-21 ENCOUNTER — Other Ambulatory Visit: Payer: Self-pay | Admitting: Physician Assistant

## 2015-06-22 ENCOUNTER — Other Ambulatory Visit: Payer: Self-pay | Admitting: Physician Assistant

## 2015-06-22 MED ORDER — ATORVASTATIN CALCIUM 20 MG PO TABS: 20.0000 mg | ORAL_TABLET | Freq: Every day | ORAL | Status: DC

## 2015-09-13 ENCOUNTER — Encounter: Payer: Self-pay | Admitting: Physician Assistant

## 2015-09-13 ENCOUNTER — Ambulatory Visit: Payer: Self-pay | Admitting: Physician Assistant

## 2015-09-13 VITALS — BP 124/78 | HR 71 | Ht 62.5 in | Wt 180.2 lb

## 2015-09-13 DIAGNOSIS — I10 Essential (primary) hypertension: Secondary | ICD-10-CM

## 2015-09-13 DIAGNOSIS — F32A Depression, unspecified: Secondary | ICD-10-CM

## 2015-09-13 DIAGNOSIS — F329 Major depressive disorder, single episode, unspecified: Secondary | ICD-10-CM

## 2015-09-13 DIAGNOSIS — E669 Obesity, unspecified: Secondary | ICD-10-CM

## 2015-09-13 DIAGNOSIS — E039 Hypothyroidism, unspecified: Secondary | ICD-10-CM

## 2015-09-13 DIAGNOSIS — E785 Hyperlipidemia, unspecified: Secondary | ICD-10-CM

## 2015-09-13 NOTE — Progress Notes (Signed)
BP 124/78 mmHg  Pulse 71  Ht 5' 2.5" (1.588 m)  Wt 180 lb 3.2 oz (81.738 kg)  BMI 32.41 kg/m2  SpO2 99%   Subjective:    Patient ID: Brittany Lynch, female    DOB: [birth year], 55 y.o.   MRN: [id number]  HPI: Brittany Lynch is a 55 y.o. female presenting on 09/13/2015 for Hypertension; Hyperlipidemia; and Thyroid Problem   HPI She has been doing well.  Her sponsor committed suicide but she will be getting another.  She is upset b/c she got a speeding ticket on the way to her appointment today  [facility]  Relevant past medical, surgical, family and social history reviewed and updated as indicated. Interim medical history since our last visit reviewed. Allergies and medications reviewed and updated.   Current outpatient prescriptions:  .  atorvastatin (LIPITOR) 20 MG tablet, Take 1 tablet (20 mg total) by mouth daily., Disp: 90 tablet, Rfl: 2 .  cyclobenzaprine (FLEXERIL) 10 MG tablet, Take 10 mg by mouth every 8 (eight) hours as needed for muscle spasms., Disp: , Rfl:  .  FLUoxetine (PROZAC) 20 MG tablet, Take 80 mg by mouth daily. , Disp: , Rfl:  .  gabapentin (NEURONTIN) 300 MG capsule, Take 600 mg by mouth 4 (four) times daily. , Disp: , Rfl:  .  HYDROXYZINE HCL PO, Take 25 mg by mouth 3 (three) times daily as needed. , Disp: , Rfl:  .  levothyroxine (SYNTHROID, LEVOTHROID) 75 MCG tablet, Take 1 tablet (75 mcg total) by mouth daily before breakfast., Disp: 90 tablet, Rfl: 4 .  Menthol, Topical Analgesic, (ICY HOT EX), Apply 1 patch topically daily as needed (pain). , Disp: , Rfl:  .  metoprolol (LOPRESSOR) 50 MG tablet, Take 1 tablet (50 mg total) by mouth 2 (two) times daily., Disp: 180 tablet, Rfl: 1 .  Multiple Vitamin (MULTIVITAMIN) tablet, Take 1 tablet by mouth daily., Disp: , Rfl:  .  oxyCODONE-acetaminophen (PERCOCET/ROXICET) 5-325 MG per tablet, Take 1-2 tablets by mouth every 8 (eight) hours as needed for severe pain. , Disp: , Rfl:  .   polycarbophil (FIBERCON) 625 MG tablet, Take 1,250 mg by mouth at bedtime., Disp: , Rfl:  .  omeprazole (PRILOSEC) 40 MG capsule, Take 20 mg by mouth daily. Reported on 09/13/2015, Disp: , Rfl:    Review of Systems  Constitutional: Positive for diaphoresis and fatigue. Negative for fever, chills, appetite change and unexpected weight change.  HENT: Positive for dental problem. Negative for congestion, drooling, ear pain, facial swelling, hearing loss, mouth sores, sneezing, sore throat, trouble swallowing and voice change.   Eyes: Negative for pain, discharge, redness, itching and visual disturbance.  Respiratory: Negative for cough, choking, shortness of breath and wheezing.   Cardiovascular: Negative for chest pain, palpitations and leg swelling.  Gastrointestinal: Positive for constipation. Negative for vomiting, abdominal pain, diarrhea and blood in stool.  Endocrine: Positive for heat intolerance. Negative for cold intolerance and polydipsia.  Genitourinary: Negative for dysuria, hematuria and decreased urine volume.  Musculoskeletal: Positive for back pain, arthralgias and gait problem.  Skin: Negative for rash.  Allergic/Immunologic: Negative for environmental allergies.  Neurological: Negative for seizures, syncope, light-headedness and headaches.  Hematological: Negative for adenopathy.  Psychiatric/Behavioral: Positive for dysphoric mood and agitation. Negative for suicidal ideas. The patient is nervous/anxious.     Per HPI unless specifically indicated above     Objective:    BP 124/78 mmHg  Pulse 71  Ht  5' 2.5" (1.588 m)  Wt 180 lb 3.2 oz (81.738 kg)  BMI 32.41 kg/m2  SpO2 99%  Wt Readings from Last 3 Encounters:  09/13/15 180 lb 3.2 oz (81.738 kg)  06/13/15 175 lb 6.4 oz (79.561 kg)  05/23/15 176 lb 8 oz (80.06 kg)    Physical Exam  Constitutional: She is oriented to person, place, and time. She appears well-developed and well-nourished.  HENT:  Head: Normocephalic  and atraumatic.  Neck: Neck supple.  Cardiovascular: Normal rate and regular rhythm.   Pulmonary/Chest: Effort normal and breath sounds normal.  Abdominal: Soft. Bowel sounds are normal. She exhibits no mass. There is no hepatosplenomegaly. There is no tenderness.  Musculoskeletal: She exhibits no edema.  Lymphadenopathy:    She has no cervical adenopathy.  Neurological: She is alert and oriented to person, place, and time.  Skin: Skin is warm and dry.  Psychiatric: She has a normal mood and affect. Her behavior is normal.  Vitals reviewed.       Assessment & Plan:   Encounter Diagnoses  Name Primary?  . Hyperlipidemia Yes  . Essential hypertension   . Hypothyroidism, unspecified hypothyroidism type   . Depression   . Obesity, unspecified     -Check lipids- will call with results -Get [facility] report [facility] -F/u 3 months.  RTO sooner prn

## 2015-09-28 ENCOUNTER — Telehealth: Payer: Self-pay | Admitting: Student

## 2015-09-28 NOTE — Telephone Encounter (Signed)
Called and notified pt that Brittany Parr, PA-C received colonoscopy report from 2012 and it recommended f/u colonoscopy in 3 years. Pt was informed she is due for another colonoscopy. Pt understood. Pt stated she was not approved for the [facility] discount, and was informed she would have to fill one out to submit. Pt understood. A [facility] discount application will be mailed to her home address on file.

## 2015-09-30 ENCOUNTER — Other Ambulatory Visit: Payer: Self-pay | Admitting: Physician Assistant

## 2015-10-14 ENCOUNTER — Other Ambulatory Visit: Payer: Self-pay | Admitting: Physician Assistant

## 2015-10-14 LAB — COMPLETE METABOLIC PANEL WITH GFR
ALBUMIN: 4.6 g/dL (ref 3.6–5.1)
ALK PHOS: 96 U/L (ref 33–130)
ALT: 23 U/L (ref 6–29)
AST: 20 U/L (ref 10–35)
BUN: 10 mg/dL (ref 7–25)
CALCIUM: 9.5 mg/dL (ref 8.6–10.4)
CHLORIDE: 101 mmol/L (ref 98–110)
CO2: 29 mmol/L (ref 20–31)
Creat: 0.69 mg/dL (ref 0.50–1.05)
Glucose, Bld: 114 mg/dL — ABNORMAL HIGH (ref 65–99)
POTASSIUM: 4.6 mmol/L (ref 3.5–5.3)
Sodium: 140 mmol/L (ref 135–146)
Total Bilirubin: 0.6 mg/dL (ref 0.2–1.2)
Total Protein: 7.4 g/dL (ref 6.1–8.1)

## 2015-10-14 LAB — LIPID PANEL
CHOL/HDL RATIO: 3.2 ratio (ref <=5.0)
Cholesterol: 198 mg/dL (ref 125–200)
HDL: 61 mg/dL (ref >=46)
LDL Cholesterol: 100 mg/dL (ref <130)
TRIGLYCERIDES: 186 mg/dL — AB (ref <150)
VLDL: 37 mg/dL — AB (ref <30)

## 2015-11-01 ENCOUNTER — Other Ambulatory Visit: Payer: Self-pay | Admitting: Physician Assistant

## 2015-11-18 ENCOUNTER — Encounter: Payer: Self-pay | Admitting: Physician Assistant

## 2015-12-06 ENCOUNTER — Other Ambulatory Visit: Payer: Self-pay

## 2015-12-06 DIAGNOSIS — I10 Essential (primary) hypertension: Secondary | ICD-10-CM

## 2015-12-06 DIAGNOSIS — E039 Hypothyroidism, unspecified: Secondary | ICD-10-CM

## 2015-12-06 DIAGNOSIS — E785 Hyperlipidemia, unspecified: Secondary | ICD-10-CM

## 2015-12-10 LAB — COMPLETE METABOLIC PANEL WITH GFR
ALT: 34 U/L — ABNORMAL HIGH (ref 6–29)
AST: 25 U/L (ref 10–35)
Albumin: 4.6 g/dL (ref 3.6–5.1)
Alkaline Phosphatase: 87 U/L (ref 33–130)
BUN: 15 mg/dL (ref 7–25)
CHLORIDE: 97 mmol/L — AB (ref 98–110)
CO2: 29 mmol/L (ref 20–31)
Calcium: 9.6 mg/dL (ref 8.6–10.4)
Creat: 0.82 mg/dL (ref 0.50–1.05)
GFR, EST NON AFRICAN AMERICAN: 81 mL/min (ref >=60)
GFR, Est African American: >89 mL/min (ref >=60)
GLUCOSE: 82 mg/dL (ref 65–99)
POTASSIUM: 4.8 mmol/L (ref 3.5–5.3)
SODIUM: 139 mmol/L (ref 135–146)
Total Bilirubin: 0.4 mg/dL (ref 0.2–1.2)
Total Protein: 7.3 g/dL (ref 6.1–8.1)

## 2015-12-10 LAB — LIPID PANEL
CHOL/HDL RATIO: 3 ratio (ref <=5.0)
Cholesterol: 180 mg/dL (ref 125–200)
HDL: 60 mg/dL (ref >=46)
LDL CALC: 64 mg/dL (ref <130)
Triglycerides: 278 mg/dL — ABNORMAL HIGH (ref <150)
VLDL: 56 mg/dL — AB (ref <30)

## 2015-12-10 LAB — TSH: TSH: 4.98 mIU/L — ABNORMAL HIGH

## 2015-12-14 ENCOUNTER — Encounter: Payer: Self-pay | Admitting: Physician Assistant

## 2015-12-14 ENCOUNTER — Ambulatory Visit: Payer: Self-pay | Admitting: Physician Assistant

## 2015-12-14 VITALS — BP 126/82 | HR 77 | Ht 62.5 in | Wt 179.8 lb

## 2015-12-14 DIAGNOSIS — I10 Essential (primary) hypertension: Secondary | ICD-10-CM

## 2015-12-14 DIAGNOSIS — E785 Hyperlipidemia, unspecified: Secondary | ICD-10-CM

## 2015-12-14 DIAGNOSIS — Z860100 Personal history of colon polyps, unspecified: Secondary | ICD-10-CM | POA: Insufficient documentation

## 2015-12-14 DIAGNOSIS — E039 Hypothyroidism, unspecified: Secondary | ICD-10-CM

## 2015-12-14 DIAGNOSIS — F32A Depression, unspecified: Secondary | ICD-10-CM

## 2015-12-14 DIAGNOSIS — F329 Major depressive disorder, single episode, unspecified: Secondary | ICD-10-CM

## 2015-12-14 NOTE — Progress Notes (Signed)
BP 126/82 mmHg  Pulse 77  Ht 5' 2.5" (1.588 m)  Wt 179 lb 12.8 oz (81.557 kg)  BMI 32.34 kg/m2  SpO2 97%   Subjective:    Patient ID: Brittany Lynch, female    DOB: [birth year], 55 y.o.   MRN: [id number]  HPI: Brittany Lynch is a 55 y.o. female presenting on 12/14/2015 for Hyperlipidemia   HPI   -[facility] [place] issues  -colonoscopy 05/18/11 (in media folder- pathology report in Labs folder)- had polyp- needs repeat  -pt states no new problems  Relevant past medical, surgical, family and social history reviewed and updated as indicated. Interim medical history since our last visit reviewed. Allergies and medications reviewed and updated.   Current outpatient prescriptions:  .  atorvastatin (LIPITOR) 20 MG tablet, Take 1 tablet (20 mg total) by mouth daily., Disp: 90 tablet, Rfl: 2 .  cyclobenzaprine (FLEXERIL) 10 MG tablet, Take 10 mg by mouth every 8 (eight) hours as needed for muscle spasms., Disp: , Rfl:  .  FLUoxetine (PROZAC) 20 MG tablet, Take 80 mg by mouth daily. , Disp: , Rfl:  .  gabapentin (NEURONTIN) 300 MG capsule, Take 600-1,800 mg by mouth. Take 600mg  q AM and 1800mg  Qhs, Disp: , Rfl:  .  HYDROXYZINE HCL PO, Take 25 mg by mouth 3 (three) times daily as needed. , Disp: , Rfl:  .  Menthol, Topical Analgesic, (ICY HOT EX), Apply 1 patch topically daily as needed (pain). , Disp: , Rfl:  .  metoprolol (LOPRESSOR) 50 MG tablet, TAKE 1 Tablet  BY MOUTH TWICE DAILY, Disp: 180 tablet, Rfl: 1 .  Multiple Vitamin (MULTIVITAMIN) tablet, Take 1 tablet by mouth daily., Disp: , Rfl:  .  omeprazole (PRILOSEC) 20 MG capsule, TAKE 2 Capsules BY MOUTH ONCE DAILY FOR REFLUX, Disp: 180 capsule, Rfl: 1 .  oxyCODONE-acetaminophen (PERCOCET/ROXICET) 5-325 MG per tablet, Take 1-2 tablets by mouth every 8 (eight) hours as needed for severe pain. , Disp: , Rfl:  .  polycarbophil (FIBERCON) 625 MG tablet, Take 1,250 mg by mouth at bedtime., Disp: , Rfl:  .  SYNTHROID  75 MCG tablet, TAKE 1 Tablet BY MOUTH ONCE DAILY FOR FOR THYROID, Disp: 90 tablet, Rfl: 1 .  traZODone (DESYREL) 100 MG tablet, Take 100 mg by mouth at bedtime., Disp: , Rfl:    Review of Systems  Constitutional: Positive for diaphoresis and fatigue. Negative for fever, chills, appetite change and unexpected weight change.  HENT: Positive for dental problem. Negative for congestion, drooling, ear pain, facial swelling, hearing loss, mouth sores, sneezing, sore throat, trouble swallowing and voice change.   Eyes: Negative for pain, discharge, redness, itching and visual disturbance.  Respiratory: Negative for cough, choking, shortness of breath and wheezing.   Cardiovascular: Negative for chest pain, palpitations and leg swelling.  Gastrointestinal: Negative for vomiting, abdominal pain, diarrhea, constipation and blood in stool.  Endocrine: Negative for cold intolerance, heat intolerance and polydipsia.  Genitourinary: Negative for dysuria, hematuria and decreased urine volume.  Musculoskeletal: Positive for back pain, arthralgias and gait problem.  Skin: Negative for rash.  Allergic/Immunologic: Positive for environmental allergies.  Neurological: Positive for headaches. Negative for seizures, syncope and light-headedness.  Hematological: Negative for adenopathy.  Psychiatric/Behavioral: Positive for dysphoric mood and agitation. Negative for suicidal ideas. The patient is nervous/anxious.     Per HPI unless specifically indicated above     Objective:    BP 126/82 mmHg  Pulse 77  Ht  5' 2.5" (1.588 m)  Wt 179 lb 12.8 oz (81.557 kg)  BMI 32.34 kg/m2  SpO2 97%  Wt Readings from Last 3 Encounters:  12/14/15 179 lb 12.8 oz (81.557 kg)  09/13/15 180 lb 3.2 oz (81.738 kg)  06/13/15 175 lb 6.4 oz (79.561 kg)    Physical Exam  Constitutional: She is oriented to person, place, and time. She appears well-developed and well-nourished.  HENT:  Head: Normocephalic and atraumatic.  Neck:  Neck supple.  Cardiovascular: Normal rate and regular rhythm.   Pulmonary/Chest: Effort normal and breath sounds normal.  Abdominal: Soft. Bowel sounds are normal. She exhibits no mass. There is no hepatosplenomegaly. There is no tenderness.  Musculoskeletal: She exhibits no edema.  Lymphadenopathy:    She has no cervical adenopathy.  Neurological: She is alert and oriented to person, place, and time.  Skin: Skin is warm and dry.  Psychiatric: She has a normal mood and affect. Her behavior is normal.  Vitals reviewed.   Results for orders placed or performed in visit on 12/06/15  Lipid Profile  Result Value Ref Range   Cholesterol 180 125 - 200 mg/dL   Triglycerides 278 (H) <150 mg/dL   HDL 60 >=46 mg/dL   Total CHOL/HDL Ratio 3.0 <=5.0 Ratio   VLDL 56 (H) <30 mg/dL   LDL Cholesterol 64 <130 mg/dL  COMPLETE METABOLIC PANEL WITH GFR  Result Value Ref Range   Sodium 139 135 - 146 mmol/L   Potassium 4.8 3.5 - 5.3 mmol/L   Chloride 97 (L) 98 - 110 mmol/L   CO2 29 20 - 31 mmol/L   Glucose, Bld 82 65 - 99 mg/dL   BUN 15 7 - 25 mg/dL   Creat 0.82 0.50 - 1.05 mg/dL   Total Bilirubin 0.4 0.2 - 1.2 mg/dL   Alkaline Phosphatase 87 33 - 130 U/L   AST 25 10 - 35 U/L   ALT 34 (H) 6 - 29 U/L   Total Protein 7.3 6.1 - 8.1 g/dL   Albumin 4.6 3.6 - 5.1 g/dL   Calcium 9.6 8.6 - 10.4 mg/dL   GFR, Est African American >89 >=60 mL/min   GFR, Est Non African American 81 >=60 mL/min  TSH  Result Value Ref Range   TSH 4.98 (H) mIU/L      Assessment & Plan:   Encounter Diagnoses  Name Primary?  . Essential hypertension Yes  . Hyperlipidemia   . Hypothyroidism, unspecified hypothyroidism type   . Depression   . History of colonic polyps     -reviewed labs with pt -she is counseled to [facility] -mammogram due in august- will order at next OV -Pt says she was denied for [facility] discount- in aug/oct 2016- will have nurse check on reason. If can't have her seen here, will send to  [facility]. She needs GI referral for repeat colonoscopy -F/u 71months.  RTO sooner prn

## 2015-12-19 ENCOUNTER — Encounter: Payer: Self-pay | Admitting: Internal Medicine

## 2016-01-02 ENCOUNTER — Telehealth: Payer: Self-pay | Admitting: Student

## 2016-01-02 NOTE — Telephone Encounter (Signed)
Pt called and left voicemail to notify the office that her BP this morning while at [facility] was 144/94. Pt's call was returned, and she was informed that PA was aware of what her BP was this morning. Pt was advised to take her BP med daily and to monitor her BP. Considering BP readings during last appointments were within normal range she is to keep her f/u appt in August. Pt verbalized understanding regarding instructions.

## 2016-01-05 ENCOUNTER — Ambulatory Visit: Payer: Self-pay | Admitting: Nurse Practitioner

## 2016-02-07 ENCOUNTER — Ambulatory Visit: Payer: Self-pay | Admitting: Nurse Practitioner

## 2016-03-07 ENCOUNTER — Other Ambulatory Visit: Payer: Self-pay | Admitting: Student

## 2016-03-07 DIAGNOSIS — I10 Essential (primary) hypertension: Secondary | ICD-10-CM

## 2016-03-07 DIAGNOSIS — E785 Hyperlipidemia, unspecified: Secondary | ICD-10-CM

## 2016-03-07 DIAGNOSIS — E039 Hypothyroidism, unspecified: Secondary | ICD-10-CM

## 2016-03-12 ENCOUNTER — Other Ambulatory Visit: Payer: Self-pay | Admitting: Physician Assistant

## 2016-03-12 LAB — CBC
HCT: 39 % (ref 35.0–45.0)
HEMOGLOBIN: 12.7 g/dL (ref 11.7–15.5)
MCH: 27.4 pg (ref 27.0–33.0)
MCHC: 32.6 g/dL (ref 32.0–36.0)
MCV: 84.1 fL (ref 80.0–100.0)
MPV: 10.1 fL (ref 7.5–12.5)
Platelets: 363 10*3/uL (ref 140–400)
RBC: 4.64 MIL/uL (ref 3.80–5.10)
RDW: 14.8 % (ref 11.0–15.0)
WBC: 6.5 10*3/uL (ref 3.8–10.8)

## 2016-03-13 LAB — COMPLETE METABOLIC PANEL WITH GFR
ALK PHOS: 90 U/L (ref 33–130)
ALT: 40 U/L — AB (ref 6–29)
AST: 30 U/L (ref 10–35)
Albumin: 4.4 g/dL (ref 3.6–5.1)
BILIRUBIN TOTAL: 0.4 mg/dL (ref 0.2–1.2)
BUN: 10 mg/dL (ref 7–25)
CO2: 28 mmol/L (ref 20–31)
CREATININE: 0.76 mg/dL (ref 0.50–1.05)
Calcium: 9.6 mg/dL (ref 8.6–10.4)
Chloride: 100 mmol/L (ref 98–110)
GFR, Est Non African American: 89 mL/min (ref >=60)
Glucose, Bld: 102 mg/dL — ABNORMAL HIGH (ref 65–99)
Potassium: 5.3 mmol/L (ref 3.5–5.3)
Sodium: 139 mmol/L (ref 135–146)
TOTAL PROTEIN: 7.1 g/dL (ref 6.1–8.1)

## 2016-03-13 LAB — TSH: TSH: 4.08 m[IU]/L

## 2016-03-13 LAB — LIPID PANEL
CHOLESTEROL: 203 mg/dL — AB (ref 125–200)
HDL: 53 mg/dL (ref >=46)
LDL Cholesterol: 98 mg/dL (ref <130)
Total CHOL/HDL Ratio: 3.8 Ratio (ref <=5.0)
Triglycerides: 261 mg/dL — ABNORMAL HIGH (ref <150)
VLDL: 52 mg/dL — ABNORMAL HIGH (ref <30)

## 2016-03-15 ENCOUNTER — Ambulatory Visit: Payer: Self-pay | Admitting: Physician Assistant

## 2016-03-15 ENCOUNTER — Encounter: Payer: Self-pay | Admitting: Physician Assistant

## 2016-03-15 VITALS — BP 110/70 | HR 86 | Temp 97.0°F | Ht 62.5 in | Wt 178.8 lb

## 2016-03-15 DIAGNOSIS — Z1239 Encounter for other screening for malignant neoplasm of breast: Secondary | ICD-10-CM

## 2016-03-15 DIAGNOSIS — E039 Hypothyroidism, unspecified: Secondary | ICD-10-CM

## 2016-03-15 DIAGNOSIS — F32A Depression, unspecified: Secondary | ICD-10-CM

## 2016-03-15 DIAGNOSIS — M545 Low back pain, unspecified: Secondary | ICD-10-CM

## 2016-03-15 DIAGNOSIS — G8929 Other chronic pain: Secondary | ICD-10-CM

## 2016-03-15 DIAGNOSIS — Z860100 Personal history of colon polyps, unspecified: Secondary | ICD-10-CM

## 2016-03-15 DIAGNOSIS — E785 Hyperlipidemia, unspecified: Secondary | ICD-10-CM

## 2016-03-15 DIAGNOSIS — I10 Essential (primary) hypertension: Secondary | ICD-10-CM

## 2016-03-15 DIAGNOSIS — F329 Major depressive disorder, single episode, unspecified: Secondary | ICD-10-CM

## 2016-03-15 NOTE — Progress Notes (Signed)
BP 110/70 (BP Location: Left Arm, Patient Position: Sitting, Cuff Size: Normal)   Pulse 86   Temp 97 F (36.1 C)   Ht 5' 2.5" (1.588 m)   Wt 178 lb 12.8 oz (81.1 kg)   SpO2 97%   BMI 32.18 kg/m    Subjective:    Patient ID: Brittany Lynch, female    DOB: 1960/09/22, 55 y.o.   MRN: [id number]  HPI: Brittany Lynch is a 55 y.o. female presenting on 03/15/2016 for Hypertension; Hyperlipidemia; and Back Pain (from fall last night. pt states she was bending over to press on the drainage of the tub and fllipped over.)   HPI   [facility]  Pt says she is doing well    Relevant past medical, surgical, family and social history reviewed and updated as indicated. Interim medical history since our last visit reviewed. Allergies and medications reviewed and updated.   Current Outpatient Prescriptions:  .  atorvastatin (LIPITOR) 20 MG tablet, TAKE 1 Tablet BY MOUTH ONCE DAILY, Disp: 90 tablet, Rfl: 1 .  cyclobenzaprine (FLEXERIL) 10 MG tablet, Take 10 mg by mouth every 8 (eight) hours as needed for muscle spasms., Disp: , Rfl:  .  DULoxetine (CYMBALTA) 60 MG capsule, Take 60 mg by mouth daily., Disp: , Rfl:  .  FLUoxetine (PROZAC) 20 MG tablet, Take 80 mg by mouth daily. , Disp: , Rfl:  .  gabapentin (NEURONTIN) 300 MG capsule, Take 600 mg by mouth 4 (four) times daily. Take 600mg  q AM and 1800mg  Qhs, Disp: , Rfl:  .  HYDROXYZINE HCL PO, Take 25 mg by mouth 3 (three) times daily as needed. , Disp: , Rfl:  .  Menthol, Topical Analgesic, (ICY HOT EX), Apply 1 patch topically daily as needed (pain). , Disp: , Rfl:  .  metoprolol (LOPRESSOR) 50 MG tablet, TAKE 1 Tablet  BY MOUTH TWICE DAILY, Disp: 180 tablet, Rfl: 1 .  Multiple Vitamin (MULTIVITAMIN) tablet, Take 1 tablet by mouth daily., Disp: , Rfl:  .  omeprazole (PRILOSEC) 20 MG capsule, TAKE 2 Capsules BY MOUTH ONCE DAILY FOR REFLUX, Disp: 180 capsule, Rfl: 1 .  oxyCODONE (OXY IR/ROXICODONE) 5 MG immediate release  tablet, Take 5-10 mg by mouth every 8 (eight) hours as needed for severe pain., Disp: , Rfl:  .  polycarbophil (FIBERCON) 625 MG tablet, Take 625 mg by mouth at bedtime. , Disp: , Rfl:  .  SYNTHROID 75 MCG tablet, TAKE 1 Tablet BY MOUTH ONCE DAILY FOR FOR THYROID, Disp: 90 tablet, Rfl: 1 .  traZODone (DESYREL) 100 MG tablet, Take 100 mg by mouth at bedtime., Disp: , Rfl:   Review of Systems  Constitutional: Negative for appetite change, chills, diaphoresis, fatigue, fever and unexpected weight change.  HENT: Negative for congestion, drooling, ear pain, facial swelling, hearing loss, mouth sores, sneezing, sore throat, trouble swallowing and voice change.   Eyes: Negative for pain, discharge, redness, itching and visual disturbance.  Respiratory: Negative for cough, choking, shortness of breath and wheezing.   Cardiovascular: Negative for chest pain, palpitations and leg swelling.  Gastrointestinal: Positive for constipation. Negative for abdominal pain, blood in stool, diarrhea and vomiting.  Endocrine: Negative for cold intolerance, heat intolerance and polydipsia.  Genitourinary: Negative for decreased urine volume, dysuria and hematuria.  Musculoskeletal: Positive for arthralgias, back pain and gait problem.  Skin: Negative for rash.  Allergic/Immunologic: Negative for environmental allergies.  Neurological: Negative for seizures, syncope, light-headedness and headaches.  Hematological: Negative  for adenopathy.  Psychiatric/Behavioral: Positive for dysphoric mood. Negative for agitation and suicidal ideas. The patient is nervous/anxious.     Per HPI unless specifically indicated above     Objective:    BP 110/70 (BP Location: Left Arm, Patient Position: Sitting, Cuff Size: Normal)   Pulse 86   Temp 97 F (36.1 C)   Ht 5' 2.5" (1.588 m)   Wt 178 lb 12.8 oz (81.1 kg)   SpO2 97%   BMI 32.18 kg/m   Wt Readings from Last 3 Encounters:  03/15/16 178 lb 12.8 oz (81.1 kg)  12/14/15  179 lb 12.8 oz (81.6 kg)  09/13/15 180 lb 3.2 oz (81.7 kg)    Physical Exam  Constitutional: She is oriented to person, place, and time. She appears well-developed and well-nourished.  HENT:  Head: Normocephalic and atraumatic.  Neck: Neck supple.  Cardiovascular: Normal rate and regular rhythm.   Pulmonary/Chest: Effort normal and breath sounds normal.  Abdominal: Soft. Bowel sounds are normal. She exhibits no mass. There is no hepatosplenomegaly. There is no tenderness.  Musculoskeletal: She exhibits no edema.  Lymphadenopathy:    She has no cervical adenopathy.  Neurological: She is alert and oriented to person, place, and time.  Skin: Skin is warm and dry.  Psychiatric: She has a normal mood and affect. Her behavior is normal.  Vitals reviewed.   Results for orders placed or performed in visit on 03/07/16  COMPLETE METABOLIC PANEL WITH GFR  Result Value Ref Range   Sodium 139 135 - 146 mmol/L   Potassium 5.3 3.5 - 5.3 mmol/L   Chloride 100 98 - 110 mmol/L   CO2 28 20 - 31 mmol/L   Glucose, Bld 102 (H) 65 - 99 mg/dL   BUN 10 7 - 25 mg/dL   Creat 0.76 0.50 - 1.05 mg/dL   Total Bilirubin 0.4 0.2 - 1.2 mg/dL   Alkaline Phosphatase 90 33 - 130 U/L   AST 30 10 - 35 U/L   ALT 40 (H) 6 - 29 U/L   Total Protein 7.1 6.1 - 8.1 g/dL   Albumin 4.4 3.6 - 5.1 g/dL   Calcium 9.6 8.6 - 10.4 mg/dL   GFR, Est African American >89 >=60 mL/min   GFR, Est Non African American 89 >=60 mL/min  TSH  Result Value Ref Range   TSH 4.08 mIU/L  CBC  Result Value Ref Range   WBC 6.5 3.8 - 10.8 K/uL   RBC 4.64 3.80 - 5.10 MIL/uL   Hemoglobin 12.7 11.7 - 15.5 g/dL   HCT 39.0 35.0 - 45.0 %   MCV 84.1 80.0 - 100.0 fL   MCH 27.4 27.0 - 33.0 pg   MCHC 32.6 32.0 - 36.0 g/dL   RDW 14.8 11.0 - 15.0 %   Platelets 363 140 - 400 K/uL   MPV 10.1 7.5 - 12.5 fL  Lipid Profile  Result Value Ref Range   Cholesterol 203 (H) 125 - 200 mg/dL   Triglycerides 261 (H) <150 mg/dL   HDL 53 >=46 mg/dL   Total  CHOL/HDL Ratio 3.8 <=5.0 Ratio   VLDL 52 (H) <30 mg/dL   LDL Cholesterol 98 <130 mg/dL      Assessment & Plan:   Encounter Diagnoses  Name Primary?  . Hyperlipidemia Yes  . Hypothyroidism, unspecified hypothyroidism type   . Essential hypertension   . Depression   . Chronic lower back pain   . History of colonic polyps   . Screening for breast  cancer     -reviewed labs with pt -Add fish oil 1 daily for triglycerides -continue current medications -order screening Mammogram Gave pt [facility] discount application.  she needs to see gi for repeat colonoscopy.  Pt will notify our office when she gets application turned in so we can re-enter her referral to GI -f/u 4 months.  RTO sooner prn

## 2016-03-15 NOTE — Patient Instructions (Signed)
Add fish oil one daily Turn in [facility] discount application

## 2016-04-05 ENCOUNTER — Ambulatory Visit (HOSPITAL_COMMUNITY): Payer: Self-pay

## 2016-04-30 ENCOUNTER — Ambulatory Visit (HOSPITAL_COMMUNITY): Payer: Self-pay

## 2016-05-21 ENCOUNTER — Other Ambulatory Visit: Payer: Self-pay | Admitting: Physician Assistant

## 2016-05-21 MED ORDER — LEVOTHYROXINE SODIUM 75 MCG PO TABS: ORAL_TABLET | ORAL | 2 refills | Status: DC

## 2016-05-21 MED ORDER — METOPROLOL TARTRATE 50 MG PO TABS: 50.0000 mg | ORAL_TABLET | Freq: Two times a day (BID) | ORAL | 2 refills | Status: DC

## 2016-06-20 ENCOUNTER — Other Ambulatory Visit: Payer: Self-pay

## 2016-06-20 DIAGNOSIS — I10 Essential (primary) hypertension: Secondary | ICD-10-CM

## 2016-06-20 DIAGNOSIS — E039 Hypothyroidism, unspecified: Secondary | ICD-10-CM

## 2016-06-20 DIAGNOSIS — E785 Hyperlipidemia, unspecified: Secondary | ICD-10-CM

## 2016-06-28 ENCOUNTER — Encounter: Payer: Self-pay | Admitting: Physician Assistant

## 2016-06-28 ENCOUNTER — Ambulatory Visit: Payer: Self-pay | Admitting: Physician Assistant

## 2016-06-28 VITALS — BP 142/86 | HR 83 | Ht 62.5 in | Wt 179.0 lb

## 2016-06-28 DIAGNOSIS — K219 Gastro-esophageal reflux disease without esophagitis: Secondary | ICD-10-CM

## 2016-06-28 DIAGNOSIS — Z1239 Encounter for other screening for malignant neoplasm of breast: Secondary | ICD-10-CM

## 2016-06-28 DIAGNOSIS — G8929 Other chronic pain: Secondary | ICD-10-CM

## 2016-06-28 DIAGNOSIS — E785 Hyperlipidemia, unspecified: Secondary | ICD-10-CM

## 2016-06-28 DIAGNOSIS — F329 Major depressive disorder, single episode, unspecified: Secondary | ICD-10-CM

## 2016-06-28 DIAGNOSIS — I10 Essential (primary) hypertension: Secondary | ICD-10-CM

## 2016-06-28 DIAGNOSIS — M545 Low back pain: Secondary | ICD-10-CM

## 2016-06-28 DIAGNOSIS — F32A Depression, unspecified: Secondary | ICD-10-CM

## 2016-06-28 DIAGNOSIS — E039 Hypothyroidism, unspecified: Secondary | ICD-10-CM

## 2016-06-28 LAB — COMPREHENSIVE METABOLIC PANEL
ALBUMIN: 4.5 g/dL (ref 3.6–5.1)
ALT: 25 U/L (ref 6–29)
AST: 21 U/L (ref 10–35)
Alkaline Phosphatase: 78 U/L (ref 33–130)
BILIRUBIN TOTAL: 0.4 mg/dL (ref 0.2–1.2)
BUN: 16 mg/dL (ref 7–25)
CHLORIDE: 103 mmol/L (ref 98–110)
CO2: 31 mmol/L (ref 20–31)
CREATININE: 0.77 mg/dL (ref 0.50–1.05)
Calcium: 9.5 mg/dL (ref 8.6–10.4)
GLUCOSE: 101 mg/dL — AB (ref 65–99)
Potassium: 4.4 mmol/L (ref 3.5–5.3)
SODIUM: 143 mmol/L (ref 135–146)
Total Protein: 7.2 g/dL (ref 6.1–8.1)

## 2016-06-28 LAB — LIPID PANEL
CHOL/HDL RATIO: 3.6 ratio (ref <5.0)
Cholesterol: 193 mg/dL (ref <200)
HDL: 53 mg/dL (ref >50)
LDL CALC: 80 mg/dL (ref <100)
Triglycerides: 301 mg/dL — ABNORMAL HIGH (ref <150)
VLDL: 60 mg/dL — AB (ref <30)

## 2016-06-28 LAB — TSH: TSH: 2.74 mIU/L

## 2016-06-28 NOTE — Progress Notes (Signed)
BP (!) 142/86 (BP Location: Left Arm, Patient Position: Sitting, Cuff Size: Normal)   Pulse 83   Ht 5' 2.5" (1.588 m)   Wt 179 lb (81.2 kg)   SpO2 97%   BMI 32.22 kg/m    Subjective:    Patient ID: Brittany Lynch, female    DOB: [birth year], 55 y.o.   MRN: [id number]  HPI: Brittany Lynch is a 55 y.o. female presenting on 06/28/2016 for Hyperlipidemia   HPI   Pt is Still going to [employer]  Pt didn't make it to her mammogram appointment because her aunt died  Pt didn't get labs drawn- she couldn't get a ride  Pt says she didn't get approved for [facility]- likely b/c she is still married although she is separated  Pt has disability hearing next month  Relevant past medical, surgical, family and social history reviewed and updated as indicated. Interim medical history since our last visit reviewed. Allergies and medications reviewed and updated.   Current Outpatient Prescriptions:  .  atorvastatin (LIPITOR) 20 MG tablet, TAKE 1 Tablet BY MOUTH ONCE DAILY, Disp: 90 tablet, Rfl: 1 .  cyclobenzaprine (FLEXERIL) 10 MG tablet, Take 10 mg by mouth every 8 (eight) hours as needed for muscle spasms., Disp: , Rfl:  .  DULoxetine (CYMBALTA) 60 MG capsule, Take 90 mg by mouth daily. , Disp: , Rfl:  .  FLUoxetine (PROZAC) 20 MG tablet, Take 80 mg by mouth daily. , Disp: , Rfl:  .  gabapentin (NEURONTIN) 300 MG capsule, Take 600 mg by mouth 4 (four) times daily. Take 600mg  q AM and 1800mg  Qhs, Disp: , Rfl:  .  HYDROXYZINE HCL PO, Take 25 mg by mouth 3 (three) times daily as needed. , Disp: , Rfl:  .  levothyroxine (SYNTHROID) 75 MCG tablet, TAKE 1 Tablet BY MOUTH ONCE DAILY FOR FOR THYROID, Disp: 90 tablet, Rfl: 2 .  Menthol, Topical Analgesic, (ICY HOT EX), Apply 1 patch topically daily as needed (pain). , Disp: , Rfl:  .  metoprolol (LOPRESSOR) 50 MG tablet, Take 1 tablet (50 mg total) by mouth 2 (two) times daily., Disp: 180 tablet, Rfl: 2 .  Multiple Vitamin (MULTIVITAMIN) tablet,  Take 1 tablet by mouth daily., Disp: , Rfl:  .  omeprazole (PRILOSEC) 20 MG capsule, TAKE 2 Capsules BY MOUTH ONCE DAILY FOR REFLUX, Disp: 180 capsule, Rfl: 1 .  oxyCODONE (OXY IR/ROXICODONE) 5 MG immediate release tablet, Take 5-10 mg by mouth every 8 (eight) hours as needed for severe pain., Disp: , Rfl:  .  polycarbophil (FIBERCON) 625 MG tablet, Take 625 mg by mouth at bedtime. , Disp: , Rfl:  .  traZODone (DESYREL) 100 MG tablet, Take 50 mg by mouth at bedtime. , Disp: , Rfl:    Review of Systems  Constitutional: Positive for fatigue. Negative for appetite change, chills, diaphoresis, fever and unexpected weight change.  HENT: Positive for congestion, sneezing and sore throat. Negative for dental problem, drooling, ear pain, facial swelling, hearing loss, mouth sores, trouble swallowing and voice change.   Eyes: Positive for itching. Negative for pain, discharge, redness and visual disturbance.  Respiratory: Positive for cough. Negative for choking, shortness of breath and wheezing.   Cardiovascular: Positive for leg swelling. Negative for chest pain and palpitations.  Gastrointestinal: Negative for abdominal pain, blood in stool, constipation, diarrhea and vomiting.  Endocrine: Negative for cold intolerance, heat intolerance and polydipsia.  Genitourinary: Negative for decreased urine volume, dysuria and hematuria.  Musculoskeletal: Positive for  arthralgias, back pain and gait problem.  Skin: Negative for rash.  Allergic/Immunologic: Positive for environmental allergies.  Neurological: Positive for headaches. Negative for seizures, syncope and light-headedness.  Hematological: Negative for adenopathy.  Psychiatric/Behavioral: Positive for dysphoric mood. Negative for agitation and suicidal ideas. The patient is nervous/anxious.     Per HPI unless specifically indicated above     Objective:    BP (!) 142/86 (BP Location: Left Arm, Patient Position: Sitting, Cuff Size: Normal)    Pulse 83   Ht 5' 2.5" (1.588 m)   Wt 179 lb (81.2 kg)   SpO2 97%   BMI 32.22 kg/m   Wt Readings from Last 3 Encounters:  06/28/16 179 lb (81.2 kg)  03/15/16 178 lb 12.8 oz (81.1 kg)  12/14/15 179 lb 12.8 oz (81.6 kg)    Physical Exam  Constitutional: She is oriented to person, place, and time. She appears well-developed and well-nourished.  HENT:  Head: Normocephalic and atraumatic.  Neck: Neck supple.  Cardiovascular: Normal rate and regular rhythm.   Pulmonary/Chest: Effort normal and breath sounds normal.  Abdominal: Soft. Bowel sounds are normal. She exhibits no mass. There is no hepatosplenomegaly. There is no tenderness.  Musculoskeletal: She exhibits no edema.  Lymphadenopathy:    She has no cervical adenopathy.  Neurological: She is alert and oriented to person, place, and time.  Skin: Skin is warm and dry.  Psychiatric: She has a normal mood and affect. Her behavior is normal.  Vitals reviewed.       Assessment & Plan:   Encounter Diagnoses  Name Primary?  . Essential hypertension Yes  . Hyperlipidemia, unspecified hyperlipidemia type   . Hypothyroidism, unspecified type   . Gastroesophageal reflux disease, esophagitis presence not specified   . Depression, unspecified depression type   . Chronic low back pain, unspecified back pain laterality, with sciatica presence unspecified   . Screening for breast cancer       -we will reschedule pt's mammogram appointment -Pt to get labs drawn today. We will call with results -nurse will Check with Caryl Pina on pt's [facility] discount (she needs repeat colonscopy) -pt to follow up in 3 months.  RTO sooner prn

## 2016-07-16 ENCOUNTER — Other Ambulatory Visit: Payer: Self-pay | Admitting: Physician Assistant

## 2016-07-25 ENCOUNTER — Ambulatory Visit (HOSPITAL_COMMUNITY)
Admission: RE | Admit: 2016-07-25 | Discharge: 2016-07-25 | Disposition: A | Discharge status: Home / Self Care | Payer: Self-pay | Source: Ambulatory Visit | Attending: Physician Assistant | Admitting: Physician Assistant

## 2016-08-14 ENCOUNTER — Encounter: Payer: Self-pay | Admitting: Physician Assistant

## 2016-08-14 NOTE — Progress Notes (Signed)
Called and spoke to Development worker, community at [employer] and was notified that there is no record of patient ever turning in an application.  Pt was called and advised to come by the office to pick up a [facility] discount application to submit. Pt verbalized understanding.

## 2016-09-19 ENCOUNTER — Other Ambulatory Visit: Payer: Self-pay | Admitting: Physician Assistant

## 2016-09-27 ENCOUNTER — Encounter: Payer: Self-pay | Admitting: Physician Assistant

## 2016-09-27 ENCOUNTER — Ambulatory Visit: Payer: Self-pay | Admitting: Physician Assistant

## 2016-09-27 VITALS — BP 128/80 | HR 92 | Temp 97.7°F | Ht 62.5 in | Wt 174.5 lb

## 2016-09-27 DIAGNOSIS — E785 Hyperlipidemia, unspecified: Secondary | ICD-10-CM

## 2016-09-27 DIAGNOSIS — Z6831 Body mass index (BMI) 31.0-31.9, adult: Secondary | ICD-10-CM

## 2016-09-27 DIAGNOSIS — E039 Hypothyroidism, unspecified: Secondary | ICD-10-CM

## 2016-09-27 DIAGNOSIS — I10 Essential (primary) hypertension: Secondary | ICD-10-CM

## 2016-09-27 DIAGNOSIS — E669 Obesity, unspecified: Secondary | ICD-10-CM

## 2016-09-27 DIAGNOSIS — K219 Gastro-esophageal reflux disease without esophagitis: Secondary | ICD-10-CM

## 2016-09-27 DIAGNOSIS — M545 Low back pain: Secondary | ICD-10-CM

## 2016-09-27 DIAGNOSIS — F329 Major depressive disorder, single episode, unspecified: Secondary | ICD-10-CM

## 2016-09-27 DIAGNOSIS — F32A Depression, unspecified: Secondary | ICD-10-CM

## 2016-09-27 DIAGNOSIS — G8929 Other chronic pain: Secondary | ICD-10-CM

## 2016-09-27 NOTE — Progress Notes (Signed)
BP 128/80 (BP Location: Left Arm, Patient Position: Sitting, Cuff Size: Normal)   Pulse 92   Temp 97.7 F (36.5 C) (Other (Comment))   Ht 5' 2.5" (1.588 m)   Wt 174 lb 8 oz (79.2 kg)   SpO2 97%   BMI 31.41 kg/m    Subjective:    Patient ID: Brittany Lynch, female    DOB: November 02, 1960, 56 y.o.   MRN: [id number]  HPI: Brittany Lynch is a 56 y.o. female presenting on 09/27/2016 for Follow-up   HPI   Pt still going to [employer]   Pt did not turn in [facility] application as recommended in January.  She is waiting on disability and believes that when she gets it, she will have insurance as well.    (she needs repeat colonoscopy)  Pt says she is doing well.   Relevant past medical, surgical, family and social history reviewed and updated as indicated. Interim medical history since our last visit reviewed. Allergies and medications reviewed and updated.   Current Outpatient Prescriptions:  .  atorvastatin (LIPITOR) 20 MG tablet, TAKE 1 Tablet BY MOUTH ONCE DAILY, Disp: 90 tablet, Rfl: 3 .  citalopram (CELEXA) 20 MG tablet, Take 20 mg by mouth daily., Disp: , Rfl:  .  cyclobenzaprine (FLEXERIL) 10 MG tablet, Take 10 mg by mouth every 8 (eight) hours as needed for muscle spasms., Disp: , Rfl:  .  HYDROXYZINE HCL PO, Take 25 mg by mouth 3 (three) times daily as needed. , Disp: , Rfl:  .  levothyroxine (SYNTHROID) 75 MCG tablet, TAKE 1 Tablet BY MOUTH ONCE DAILY FOR FOR THYROID, Disp: 90 tablet, Rfl: 2 .  Menthol, Topical Analgesic, (ICY HOT EX), Apply 1 patch topically daily as needed (pain). , Disp: , Rfl:  .  metoprolol (LOPRESSOR) 50 MG tablet, Take 1 tablet (50 mg total) by mouth 2 (two) times daily., Disp: 180 tablet, Rfl: 2 .  Multiple Vitamin (MULTIVITAMIN) tablet, Take 1 tablet by mouth daily., Disp: , Rfl:  .  omeprazole (PRILOSEC) 20 MG capsule, TAKE 2 Capsules BY MOUTH ONCE DAILY FOR REFLUX, Disp: 180 capsule, Rfl: 2 .  oxyCODONE (OXY IR/ROXICODONE) 5 MG immediate release  tablet, Take 5-10 mg by mouth every 8 (eight) hours as needed for severe pain., Disp: , Rfl:  .  polycarbophil (FIBERCON) 625 MG tablet, Take 625 mg by mouth at bedtime. , Disp: , Rfl:  .  traZODone (DESYREL) 100 MG tablet, Take 50 mg by mouth at bedtime. , Disp: , Rfl:    Review of Systems  Constitutional: Positive for fatigue. Negative for appetite change, chills, diaphoresis, fever and unexpected weight change.  HENT: Positive for sneezing. Negative for congestion, dental problem, drooling, ear pain, facial swelling, hearing loss, mouth sores, sore throat, trouble swallowing and voice change.   Eyes: Positive for itching. Negative for pain, discharge, redness and visual disturbance.  Respiratory: Negative for cough, choking, shortness of breath and wheezing.   Cardiovascular: Positive for leg swelling. Negative for chest pain and palpitations.  Gastrointestinal: Positive for constipation. Negative for abdominal pain, blood in stool, diarrhea and vomiting.  Endocrine: Negative for cold intolerance, heat intolerance and polydipsia.  Genitourinary: Negative for decreased urine volume, dysuria and hematuria.  Musculoskeletal: Positive for arthralgias, back pain and gait problem.  Skin: Negative for rash.  Allergic/Immunologic: Positive for environmental allergies.  Neurological: Negative for seizures, syncope, light-headedness and headaches.  Hematological: Negative for adenopathy.  Psychiatric/Behavioral: Positive for agitation and dysphoric mood. Negative for  suicidal ideas. The patient is nervous/anxious.     Per HPI unless specifically indicated above     Objective:    BP 128/80 (BP Location: Left Arm, Patient Position: Sitting, Cuff Size: Normal)   Pulse 92   Temp 97.7 F (36.5 C) (Other (Comment))   Ht 5' 2.5" (1.588 m)   Wt 174 lb 8 oz (79.2 kg)   SpO2 97%   BMI 31.41 kg/m   Wt Readings from Last 3 Encounters:  09/27/16 174 lb 8 oz (79.2 kg)  06/28/16 179 lb (81.2 kg)   03/15/16 178 lb 12.8 oz (81.1 kg)    Physical Exam  Constitutional: She is oriented to person, place, and time. She appears well-developed and well-nourished.  HENT:  Head: Normocephalic and atraumatic.  Neck: Neck supple.  Cardiovascular: Normal rate and regular rhythm.   Pulmonary/Chest: Effort normal and breath sounds normal.  Abdominal: Soft. Bowel sounds are normal. She exhibits no mass. There is no hepatosplenomegaly. There is no tenderness.  Musculoskeletal: She exhibits no edema.  Lymphadenopathy:    She has no cervical adenopathy.  Neurological: She is alert and oriented to person, place, and time.  Skin: Skin is warm and dry.  Psychiatric: She has a normal mood and affect. Her behavior is normal.  Vitals reviewed.       Assessment & Plan:    Encounter Diagnoses  Name Primary?  . Essential hypertension Yes  . Hyperlipidemia, unspecified hyperlipidemia type   . Hypothyroidism, unspecified type   . Gastroesophageal reflux disease, esophagitis presence not specified   . Depression, unspecified depression type   . Chronic low back pain, unspecified back pain laterality, with sciatica presence unspecified   . Class 1 obesity with body mass index (BMI) of 31.0 to 31.9 in adult, unspecified obesity type, unspecified whether serious comorbidity present      -pt doesn't need labs at this time- will check in 3 months but will not order prior to appointment since pt thinking she will have insurance at that time -pt to continue current medications -follow up 3 months.  RTO sooner prn

## 2016-10-08 ENCOUNTER — Other Ambulatory Visit: Payer: Self-pay | Admitting: Physician Assistant

## 2016-12-27 ENCOUNTER — Other Ambulatory Visit (HOSPITAL_COMMUNITY)
Admission: RE | Admit: 2016-12-27 | Discharge: 2016-12-27 | Disposition: A | Discharge status: Home / Self Care | Payer: Self-pay | Source: Ambulatory Visit | Attending: Physician Assistant | Admitting: Physician Assistant

## 2016-12-27 ENCOUNTER — Ambulatory Visit: Payer: Self-pay | Admitting: Physician Assistant

## 2016-12-27 ENCOUNTER — Encounter: Payer: Self-pay | Admitting: Physician Assistant

## 2016-12-27 VITALS — BP 142/82 | HR 77 | Temp 97.7°F | Ht 62.5 in | Wt 173.0 lb

## 2016-12-27 DIAGNOSIS — F32A Depression, unspecified: Secondary | ICD-10-CM

## 2016-12-27 DIAGNOSIS — E785 Hyperlipidemia, unspecified: Secondary | ICD-10-CM | POA: Insufficient documentation

## 2016-12-27 DIAGNOSIS — E039 Hypothyroidism, unspecified: Secondary | ICD-10-CM | POA: Insufficient documentation

## 2016-12-27 DIAGNOSIS — Z6831 Body mass index (BMI) 31.0-31.9, adult: Secondary | ICD-10-CM

## 2016-12-27 DIAGNOSIS — I10 Essential (primary) hypertension: Secondary | ICD-10-CM | POA: Insufficient documentation

## 2016-12-27 DIAGNOSIS — M545 Low back pain: Secondary | ICD-10-CM

## 2016-12-27 DIAGNOSIS — G8929 Other chronic pain: Secondary | ICD-10-CM

## 2016-12-27 DIAGNOSIS — K219 Gastro-esophageal reflux disease without esophagitis: Secondary | ICD-10-CM

## 2016-12-27 DIAGNOSIS — F329 Major depressive disorder, single episode, unspecified: Secondary | ICD-10-CM

## 2016-12-27 DIAGNOSIS — E669 Obesity, unspecified: Secondary | ICD-10-CM

## 2016-12-27 LAB — COMPREHENSIVE METABOLIC PANEL
ALBUMIN: 4.6 g/dL (ref 3.5–5.0)
ALT: 30 U/L (ref 14–54)
ANION GAP: 9 (ref 5–15)
AST: 26 U/L (ref 15–41)
Alkaline Phosphatase: 86 U/L (ref 38–126)
BILIRUBIN TOTAL: 0.7 mg/dL (ref 0.3–1.2)
BUN: 13 mg/dL (ref 6–20)
CO2: 30 mmol/L (ref 22–32)
Calcium: 9.5 mg/dL (ref 8.9–10.3)
Chloride: 100 mmol/L — ABNORMAL LOW (ref 101–111)
Creatinine, Ser: 0.72 mg/dL (ref 0.44–1.00)
GFR calc Af Amer: >60 mL/min (ref >60)
GFR calc non Af Amer: >60 mL/min (ref >60)
GLUCOSE: 111 mg/dL — AB (ref 65–99)
POTASSIUM: 4.6 mmol/L (ref 3.5–5.1)
SODIUM: 139 mmol/L (ref 135–145)
TOTAL PROTEIN: 8.1 g/dL (ref 6.5–8.1)

## 2016-12-27 LAB — LIPID PANEL
CHOL/HDL RATIO: 3.6 ratio
CHOLESTEROL: 199 mg/dL (ref 0–200)
HDL: 55 mg/dL (ref >40)
LDL Cholesterol: 106 mg/dL — ABNORMAL HIGH (ref 0–99)
Triglycerides: 188 mg/dL — ABNORMAL HIGH (ref <150)
VLDL: 38 mg/dL (ref 0–40)

## 2016-12-27 LAB — TSH: TSH: 1.732 u[IU]/mL (ref 0.350–4.500)

## 2016-12-27 NOTE — Patient Instructions (Signed)
Get labs drawn today.

## 2016-12-27 NOTE — Progress Notes (Signed)
BP (!) 142/82 (BP Location: Left Arm, Patient Position: Sitting, Cuff Size: Normal)   Pulse 77   Temp 97.7 F (36.5 C)   Ht 5' 2.5" (1.588 m)   Wt 173 lb (78.5 kg)   SpO2 97%   BMI 31.14 kg/m    Subjective:    Patient ID: Brittany Lynch, female    DOB: 1960/09/24, 56 y.o.   MRN: [id number]  HPI: Brittany Lynch is a 56 y.o. female presenting on 12/27/2016 for Hyperlipidemia and Thyroid Problem   HPI   Pt is still going to [facility]  Pt has not turned in [facility] discount application  She is still waiting on disability/social security  She is doing well, she says   Relevant past medical, surgical, family and social history reviewed and updated as indicated. Interim medical history since our last visit reviewed. Allergies and medications reviewed and updated.   Current Outpatient Prescriptions:  .  atorvastatin (LIPITOR) 20 MG tablet, TAKE 1 Tablet BY MOUTH ONCE DAILY, Disp: 90 tablet, Rfl: 3 .  citalopram (CELEXA) 20 MG tablet, Take 20 mg by mouth daily., Disp: , Rfl:  .  cyclobenzaprine (FLEXERIL) 10 MG tablet, Take 10 mg by mouth every 8 (eight) hours as needed for muscle spasms., Disp: , Rfl:  .  HYDROXYZINE HCL PO, Take 25 mg by mouth 3 (three) times daily as needed. , Disp: , Rfl:  .  Menthol, Topical Analgesic, (ICY HOT EX), Apply 1 patch topically daily as needed (pain). , Disp: , Rfl:  .  metoprolol (LOPRESSOR) 50 MG tablet, TAKE 1 Tablet  BY MOUTH TWICE DAILY, Disp: 180 tablet, Rfl: 3 .  Multiple Vitamin (MULTIVITAMIN) tablet, Take 1 tablet by mouth daily., Disp: , Rfl:  .  omeprazole (PRILOSEC) 20 MG capsule, TAKE 2 Capsules BY MOUTH ONCE DAILY FOR REFLUX, Disp: 180 capsule, Rfl: 2 .  oxyCODONE (OXY IR/ROXICODONE) 5 MG immediate release tablet, Take 5-10 mg by mouth every 8 (eight) hours as needed for severe pain., Disp: , Rfl:  .  polycarbophil (FIBERCON) 625 MG tablet, Take 625 mg by mouth at bedtime. , Disp: , Rfl:  .  SYNTHROID 75 MCG tablet, TAKE 1 Tablet BY MOUTH  ONCE DAILY, Disp: 90 tablet, Rfl: 3 .  traZODone (DESYREL) 100 MG tablet, Take 50 mg by mouth at bedtime as needed. , Disp: , Rfl:    Review of Systems  Constitutional: Negative for appetite change, chills, diaphoresis, fatigue, fever and unexpected weight change.  HENT: Negative for congestion, dental problem, drooling, ear pain, facial swelling, hearing loss, mouth sores, sneezing, sore throat, trouble swallowing and voice change.   Eyes: Positive for discharge and itching. Negative for pain, redness and visual disturbance.  Respiratory: Negative for cough, choking, shortness of breath and wheezing.   Cardiovascular: Negative for chest pain, palpitations and leg swelling.  Gastrointestinal: Negative for abdominal pain, blood in stool, constipation, diarrhea and vomiting.  Endocrine: Negative for cold intolerance, heat intolerance and polydipsia.  Genitourinary: Negative for decreased urine volume, dysuria and hematuria.  Musculoskeletal: Positive for arthralgias, back pain and gait problem.  Skin: Negative for rash.  Allergic/Immunologic: Negative for environmental allergies.  Neurological: Negative for seizures, syncope, light-headedness and headaches.  Hematological: Negative for adenopathy.  Psychiatric/Behavioral: Positive for dysphoric mood. Negative for agitation and suicidal ideas. The patient is nervous/anxious.     Per HPI unless specifically indicated above     Objective:    BP (!) 142/82 (BP Location: Left Arm, Patient Position: Sitting, Cuff  Size: Normal)   Pulse 77   Temp 97.7 F (36.5 C)   Ht 5' 2.5" (1.588 m)   Wt 173 lb (78.5 kg)   SpO2 97%   BMI 31.14 kg/m   Wt Readings from Last 3 Encounters:  12/27/16 173 lb (78.5 kg)  09/27/16 174 lb 8 oz (79.2 kg)  06/28/16 179 lb (81.2 kg)    Physical Exam  Constitutional: She is oriented to person, place, and time. She appears well-developed and well-nourished.  HENT:  Head: Normocephalic and atraumatic.  Neck:  Neck supple.  Cardiovascular: Normal rate and regular rhythm.   Pulmonary/Chest: Effort normal and breath sounds normal.  Abdominal: Soft. Bowel sounds are normal. She exhibits no mass. There is no hepatosplenomegaly. There is no tenderness.  Musculoskeletal: She exhibits no edema.  Lymphadenopathy:    She has no cervical adenopathy.  Neurological: She is alert and oriented to person, place, and time.  Skin: Skin is warm and dry.  Psychiatric: She has a normal mood and affect. Her behavior is normal.  Vitals reviewed.       Assessment & Plan:   Encounter Diagnoses  Name Primary?  . Essential hypertension Yes  . Hyperlipidemia, unspecified hyperlipidemia type   . Hypothyroidism, unspecified type   . Gastroesophageal reflux disease, esophagitis presence not specified   . Depression, unspecified depression type   . Chronic low back pain, unspecified back pain laterality, with sciatica presence unspecified   . Class 1 obesity with body mass index (BMI) of 31.0 to 31.9 in adult, unspecified obesity type, unspecified whether serious comorbidity present      -Check lipids, cmp, tsh today when leaves office (she says she is fasting). Will call with results -Pt needs repeat colonoscopy- waiting on insurance -encouraged pt to follow up on [facility] application/insurance -pt to follow up in 3 months.  RTO sooner prn

## 2017-03-28 ENCOUNTER — Ambulatory Visit: Payer: Self-pay | Admitting: Physician Assistant

## 2017-04-01 ENCOUNTER — Encounter: Payer: Self-pay | Admitting: Physician Assistant

## 2017-04-02 ENCOUNTER — Other Ambulatory Visit: Payer: Self-pay | Admitting: Physician Assistant

## 2017-04-29 ENCOUNTER — Ambulatory Visit: Payer: Self-pay | Admitting: Physician Assistant

## 2017-04-29 ENCOUNTER — Encounter: Payer: Self-pay | Admitting: Physician Assistant

## 2017-04-29 VITALS — BP 126/88 | HR 70 | Temp 97.7°F | Ht 62.5 in | Wt 173.8 lb

## 2017-04-29 DIAGNOSIS — M545 Low back pain: Secondary | ICD-10-CM

## 2017-04-29 DIAGNOSIS — E039 Hypothyroidism, unspecified: Secondary | ICD-10-CM

## 2017-04-29 DIAGNOSIS — Z6831 Body mass index (BMI) 31.0-31.9, adult: Secondary | ICD-10-CM

## 2017-04-29 DIAGNOSIS — G8929 Other chronic pain: Secondary | ICD-10-CM

## 2017-04-29 DIAGNOSIS — E669 Obesity, unspecified: Secondary | ICD-10-CM

## 2017-04-29 DIAGNOSIS — Z86010 Personal history of colonic polyps: Secondary | ICD-10-CM

## 2017-04-29 DIAGNOSIS — I10 Essential (primary) hypertension: Secondary | ICD-10-CM

## 2017-04-29 DIAGNOSIS — K219 Gastro-esophageal reflux disease without esophagitis: Secondary | ICD-10-CM

## 2017-04-29 DIAGNOSIS — F32A Depression, unspecified: Secondary | ICD-10-CM

## 2017-04-29 DIAGNOSIS — E785 Hyperlipidemia, unspecified: Secondary | ICD-10-CM

## 2017-04-29 DIAGNOSIS — F329 Major depressive disorder, single episode, unspecified: Secondary | ICD-10-CM

## 2017-04-29 NOTE — Progress Notes (Signed)
BP 126/88 (BP Location: Left Arm, Patient Position: Sitting, Cuff Size: Normal)   Pulse 70   Temp 97.7 F (36.5 C) (Other (Comment))   Ht 5' 2.5" (1.588 m)   Wt 173 lb 12 oz (78.8 kg)   SpO2 98%   BMI 31.27 kg/m    Subjective:    Patient ID: Brittany Lynch, female    DOB: 1961-01-29, 56 y.o.   MRN: [id number]  HPI: Brittany Lynch is a 56 y.o. female presenting on 04/29/2017 for Hypertension; Hyperlipidemia; Hypothyroidism; and Gastroesophageal Reflux   HPI   Pt is still going to [employer]  Pt gets insurance in April. She didn't follow up on [facility] for her repeat colonoscopy which is due  Pt is feeling well today and has no complaints  Relevant past medical, surgical, family and social history reviewed and updated as indicated. Interim medical history since our last visit reviewed. Allergies and medications reviewed and updated.   Current Outpatient Prescriptions:  .  atorvastatin (LIPITOR) 20 MG tablet, TAKE 1 Tablet BY MOUTH ONCE DAILY, Disp: 90 tablet, Rfl: 3 .  citalopram (CELEXA) 20 MG tablet, Take 20 mg by mouth daily., Disp: , Rfl:  .  cyclobenzaprine (FLEXERIL) 10 MG tablet, Take 10 mg by mouth every 8 (eight) hours as needed for muscle spasms., Disp: , Rfl:  .  doxepin (SINEQUAN) 10 MG capsule, Take 10 mg by mouth at bedtime., Disp: , Rfl:  .  gabapentin (NEURONTIN) 600 MG tablet, Take 600 mg by mouth at bedtime., Disp: , Rfl:  .  HYDROXYZINE HCL PO, Take 25 mg by mouth 3 (three) times daily as needed. , Disp: , Rfl:  .  Menthol, Topical Analgesic, (ICY HOT EX), Apply 1 patch topically daily as needed (pain). , Disp: , Rfl:  .  metoprolol (LOPRESSOR) 50 MG tablet, TAKE 1 Tablet  BY MOUTH TWICE DAILY, Disp: 180 tablet, Rfl: 3 .  Multiple Vitamin (MULTIVITAMIN) tablet, Take 1 tablet by mouth daily., Disp: , Rfl:  .  omeprazole (PRILOSEC) 20 MG capsule, TAKE 2 Capsules BY MOUTH ONCE DAILY FOR REFLUX, Disp: 180 capsule, Rfl: 2 .  oxyCODONE (OXY IR/ROXICODONE) 5  MG immediate release tablet, Take 5-10 mg by mouth every 8 (eight) hours as needed for severe pain., Disp: , Rfl:  .  polycarbophil (FIBERCON) 625 MG tablet, Take 625 mg by mouth at bedtime. , Disp: , Rfl:  .  SYNTHROID 75 MCG tablet, TAKE 1 Tablet BY MOUTH ONCE DAILY, Disp: 90 tablet, Rfl: 3 .  traZODone (DESYREL) 100 MG tablet, Take 50 mg by mouth at bedtime as needed. , Disp: , Rfl:    Review of Systems  Constitutional: Positive for fatigue. Negative for appetite change, chills, diaphoresis, fever and unexpected weight change.  HENT: Negative for congestion, dental problem, drooling, ear pain, facial swelling, hearing loss, mouth sores, sneezing, sore throat, trouble swallowing and voice change.   Eyes: Positive for itching. Negative for pain, discharge, redness and visual disturbance.  Respiratory: Positive for cough. Negative for choking, shortness of breath and wheezing.   Cardiovascular: Positive for leg swelling. Negative for chest pain and palpitations.  Gastrointestinal: Negative for abdominal pain, blood in stool, constipation, diarrhea and vomiting.  Endocrine: Negative for cold intolerance, heat intolerance and polydipsia.  Genitourinary: Negative for decreased urine volume, dysuria and hematuria.  Musculoskeletal: Positive for arthralgias, back pain and gait problem.  Skin: Negative for rash.  Allergic/Immunologic: Positive for environmental allergies.  Neurological: Negative for seizures, syncope, light-headedness and  headaches.  Hematological: Negative for adenopathy.  Psychiatric/Behavioral: Positive for dysphoric mood. Negative for agitation and suicidal ideas. The patient is nervous/anxious.     Per HPI unless specifically indicated above     Objective:    BP 126/88 (BP Location: Left Arm, Patient Position: Sitting, Cuff Size: Normal)   Pulse 70   Temp 97.7 F (36.5 C) (Other (Comment))   Ht 5' 2.5" (1.588 m)   Wt 173 lb 12 oz (78.8 kg)   SpO2 98%   BMI 31.27  kg/m   Wt Readings from Last 3 Encounters:  04/29/17 173 lb 12 oz (78.8 kg)  12/27/16 173 lb (78.5 kg)  09/27/16 174 lb 8 oz (79.2 kg)    Physical Exam  Constitutional: She is oriented to person, place, and time. She appears well-developed and well-nourished.  HENT:  Head: Normocephalic and atraumatic.  Neck: Neck supple.  Cardiovascular: Normal rate and regular rhythm.   Pulmonary/Chest: Effort normal and breath sounds normal.  Abdominal: Soft. Bowel sounds are normal. She exhibits no mass. There is no hepatosplenomegaly. There is no tenderness.  Musculoskeletal: She exhibits no edema.  Lymphadenopathy:    She has no cervical adenopathy.  Neurological: She is alert and oriented to person, place, and time.  Skin: Skin is warm and dry.  Psychiatric: She has a normal mood and affect. Her behavior is normal.  Vitals reviewed.       Assessment & Plan:   Encounter Diagnoses  Name Primary?  . Essential hypertension Yes  . Hyperlipidemia, unspecified hyperlipidemia type   . Hypothyroidism, unspecified type   . Depression, unspecified depression type   . Gastroesophageal reflux disease, esophagitis presence not specified   . Class 1 obesity with body mass index (BMI) of 31.0 to 31.9 in adult, unspecified obesity type, unspecified whether serious comorbidity present   . Chronic low back pain, unspecified back pain laterality, with sciatica presence unspecified   . History of colonic polyps     -encouraged pt to follow-up on [facility] for repeat conolonoscopy -Pt had great labs in summer so will recheck 3 months from now -pt to continue current medications -pt to follow up in 3 months. RTO sooner prn

## 2017-06-07 ENCOUNTER — Other Ambulatory Visit: Payer: Self-pay | Admitting: Physician Assistant

## 2017-07-30 ENCOUNTER — Ambulatory Visit: Payer: Self-pay | Admitting: Physician Assistant

## 2017-07-30 ENCOUNTER — Encounter: Payer: Self-pay | Admitting: Physician Assistant

## 2017-07-30 VITALS — BP 130/90 | HR 77 | Temp 97.7°F | Ht 62.5 in | Wt 175.8 lb

## 2017-07-30 DIAGNOSIS — F32A Depression, unspecified: Secondary | ICD-10-CM

## 2017-07-30 DIAGNOSIS — E039 Hypothyroidism, unspecified: Secondary | ICD-10-CM

## 2017-07-30 DIAGNOSIS — G8929 Other chronic pain: Secondary | ICD-10-CM

## 2017-07-30 DIAGNOSIS — I10 Essential (primary) hypertension: Secondary | ICD-10-CM

## 2017-07-30 DIAGNOSIS — E785 Hyperlipidemia, unspecified: Secondary | ICD-10-CM

## 2017-07-30 DIAGNOSIS — F329 Major depressive disorder, single episode, unspecified: Secondary | ICD-10-CM

## 2017-07-30 DIAGNOSIS — M545 Low back pain: Secondary | ICD-10-CM

## 2017-07-30 DIAGNOSIS — K635 Polyp of colon: Secondary | ICD-10-CM

## 2017-07-30 NOTE — Progress Notes (Signed)
BP 130/90 (BP Location: Left Arm, Patient Position: Sitting, Cuff Size: Normal)   Pulse 77   Temp 97.7 F (36.5 C)   Ht 5' 2.5" (1.588 m)   Wt 175 lb 12 oz (79.7 kg)   SpO2 92%   BMI 31.63 kg/m    Subjective:    Patient ID: Brittany Lynch, female    DOB: [birth year], 57 y.o.   MRN: [id number]  HPI: Brittany Lynch is a 57 y.o. female presenting on 07/30/2017 for Diabetes; Hyperlipidemia; and Hypertension   HPI  Pt is no longer going to [facility].  She says she can wait until April when she gets insurance.   She is going to [employer].  She is also due for repeat colonoscopy (discussed with pt at last OV/last colonoscopy 2012)  She is planning to go to [place] when she gets her insurance in April.  Pt didn't go to get labs drawn when nurse called her last week.    Relevant past medical, surgical, family and social history reviewed and updated as indicated. Interim medical history since our last visit reviewed. Allergies and medications reviewed and updated.   Current Outpatient Medications:  .  atorvastatin (LIPITOR) 20 MG tablet, TAKE 1 Tablet BY MOUTH ONCE DAILY, Disp: 90 tablet, Rfl: 3 .  cyclobenzaprine (FLEXERIL) 10 MG tablet, Take 10 mg by mouth every 8 (eight) hours as needed for muscle spasms., Disp: , Rfl:  .  gabapentin (NEURONTIN) 600 MG tablet, Take 600 mg by mouth at bedtime., Disp: , Rfl:  .  Menthol, Topical Analgesic, (ICY HOT EX), Apply 1 patch topically daily as needed (pain). , Disp: , Rfl:  .  metoprolol (LOPRESSOR) 50 MG tablet, TAKE 1 Tablet  BY MOUTH TWICE DAILY, Disp: 180 tablet, Rfl: 3 .  Multiple Vitamin (MULTIVITAMIN) tablet, Take 1 tablet by mouth daily., Disp: , Rfl:  .  omeprazole (PRILOSEC) 20 MG capsule, TAKE 2 Capsules BY MOUTH ONCE DAILY, Disp: 180 capsule, Rfl: 2 .  oxyCODONE (OXY IR/ROXICODONE) 5 MG immediate release tablet, Take 5-10 mg by mouth every 8 (eight) hours as needed for severe pain., Disp: , Rfl:  .  polycarbophil  (FIBERCON) 625 MG tablet, Take 625 mg by mouth at bedtime. , Disp: , Rfl:  .  SYNTHROID 75 MCG tablet, TAKE 1 Tablet BY MOUTH ONCE DAILY, Disp: 90 tablet, Rfl: 3  Review of Systems  Constitutional: Negative for appetite change, chills, diaphoresis, fatigue, fever and unexpected weight change.  HENT: Negative for congestion, dental problem, drooling, ear pain, facial swelling, hearing loss, mouth sores, sneezing, sore throat, trouble swallowing and voice change.   Eyes: Negative for pain, discharge, redness, itching and visual disturbance.  Respiratory: Negative for cough, choking, shortness of breath and wheezing.   Cardiovascular: Negative for chest pain, palpitations and leg swelling.  Gastrointestinal: Negative for abdominal pain, blood in stool, constipation, diarrhea and vomiting.  Endocrine: Negative for cold intolerance, heat intolerance and polydipsia.  Genitourinary: Negative for decreased urine volume, dysuria and hematuria.  Musculoskeletal: Positive for arthralgias, back pain and gait problem.  Skin: Negative for rash.  Allergic/Immunologic: Negative for environmental allergies.  Neurological: Negative for seizures, syncope, light-headedness and headaches.  Hematological: Negative for adenopathy.  Psychiatric/Behavioral: Positive for agitation and dysphoric mood. Negative for suicidal ideas. The patient is nervous/anxious.     Per HPI unless specifically indicated above     Objective:    BP 130/90 (BP Location: Left Arm, Patient Position: Sitting, Cuff Size: Normal)  Pulse 77   Temp 97.7 F (36.5 C)   Ht 5' 2.5" (1.588 m)   Wt 175 lb 12 oz (79.7 kg)   SpO2 92%   BMI 31.63 kg/m   Wt Readings from Last 3 Encounters:  07/30/17 175 lb 12 oz (79.7 kg)  04/29/17 173 lb 12 oz (78.8 kg)  12/27/16 173 lb (78.5 kg)    Physical Exam  Constitutional: She is oriented to person, place, and time. She appears well-developed and well-nourished.  HENT:  Head: Normocephalic and  atraumatic.  Neck: Neck supple.  Cardiovascular: Normal rate and regular rhythm.  Pulmonary/Chest: Effort normal and breath sounds normal.  Abdominal: Soft. Bowel sounds are normal. She exhibits no mass. There is no hepatosplenomegaly. There is no tenderness.  Musculoskeletal: She exhibits no edema.  Lymphadenopathy:    She has no cervical adenopathy.  Neurological: She is alert and oriented to person, place, and time.  Skin: Skin is warm and dry.  Psychiatric: She has a normal mood and affect. Her behavior is normal.  Vitals reviewed.       Assessment & Plan:   Encounter Diagnoses  Name Primary?  . Essential hypertension Yes  . Hyperlipidemia, unspecified hyperlipidemia type   . Hypothyroidism, unspecified type   . Depression, unspecified depression type   . Chronic low back pain, unspecified back pain laterality, with sciatica presence unspecified   . Polyp of colon, unspecified part of colon, unspecified type     -Discussed with pt that she will need to complete [facility] discount application in order to get labs drawn due to new policy or else self pay for her labs. Her orders are in Epic so she can get those done at her convenience. -pt to continue current medications -no follow up will be scheduled as her insurance starts 10/14/17.  RTO sooner prn

## 2017-08-15 ENCOUNTER — Other Ambulatory Visit: Payer: Self-pay | Admitting: Physician Assistant

## 2017-10-15 ENCOUNTER — Ambulatory Visit: Payer: Self-pay | Admitting: Family

## 2017-10-15 DIAGNOSIS — M542 Cervicalgia: Secondary | ICD-10-CM | POA: Diagnosis not present

## 2017-10-15 DIAGNOSIS — M48061 Spinal stenosis, lumbar region without neurogenic claudication: Secondary | ICD-10-CM | POA: Diagnosis not present

## 2017-10-15 DIAGNOSIS — Z6832 Body mass index (BMI) 32.0-32.9, adult: Secondary | ICD-10-CM | POA: Diagnosis not present

## 2017-10-15 DIAGNOSIS — M5417 Radiculopathy, lumbosacral region: Secondary | ICD-10-CM | POA: Diagnosis not present

## 2017-10-17 ENCOUNTER — Encounter: Payer: Self-pay | Admitting: Family

## 2017-10-17 ENCOUNTER — Ambulatory Visit (INDEPENDENT_AMBULATORY_CARE_PROVIDER_SITE_OTHER): Payer: Medicare HMO | Admitting: Family

## 2017-10-17 VITALS — BP 125/82 | HR 73 | Temp 97.8°F | Ht 62.5 in | Wt 178.4 lb

## 2017-10-17 DIAGNOSIS — G8929 Other chronic pain: Secondary | ICD-10-CM

## 2017-10-17 DIAGNOSIS — F411 Generalized anxiety disorder: Secondary | ICD-10-CM

## 2017-10-17 DIAGNOSIS — E039 Hypothyroidism, unspecified: Secondary | ICD-10-CM | POA: Diagnosis not present

## 2017-10-17 DIAGNOSIS — R5383 Other fatigue: Secondary | ICD-10-CM | POA: Diagnosis not present

## 2017-10-17 DIAGNOSIS — F329 Major depressive disorder, single episode, unspecified: Secondary | ICD-10-CM | POA: Diagnosis not present

## 2017-10-17 DIAGNOSIS — Z114 Encounter for screening for human immunodeficiency virus [HIV]: Secondary | ICD-10-CM | POA: Diagnosis not present

## 2017-10-17 DIAGNOSIS — E785 Hyperlipidemia, unspecified: Secondary | ICD-10-CM

## 2017-10-17 DIAGNOSIS — Z1159 Encounter for screening for other viral diseases: Secondary | ICD-10-CM

## 2017-10-17 DIAGNOSIS — Z860100 Personal history of colon polyps, unspecified: Secondary | ICD-10-CM

## 2017-10-17 DIAGNOSIS — F32A Depression, unspecified: Secondary | ICD-10-CM

## 2017-10-17 DIAGNOSIS — I10 Essential (primary) hypertension: Secondary | ICD-10-CM

## 2017-10-17 DIAGNOSIS — R6889 Other general symptoms and signs: Secondary | ICD-10-CM | POA: Diagnosis not present

## 2017-10-17 DIAGNOSIS — M545 Low back pain: Secondary | ICD-10-CM

## 2017-10-17 DIAGNOSIS — Z6832 Body mass index (BMI) 32.0-32.9, adult: Secondary | ICD-10-CM | POA: Diagnosis not present

## 2017-10-17 MED ORDER — VENLAFAXINE HCL ER 75 MG PO CP24: 75.0000 mg | ORAL_CAPSULE | Freq: Every day | ORAL | 1 refills | Status: DC

## 2017-10-17 NOTE — Patient Instructions (Signed)

## 2017-10-17 NOTE — Progress Notes (Signed)
Subjective:    Patient ID: Brittany Lynch, female    DOB: [birth year], 57 y.o.   MRN: [id number]  Pt presents to the office today reestablish care at our office. Pt is followed by Pain Clinic every 2 months for chronic back pain.  Hypertension  This is a chronic problem. The current episode started more than 1 year ago. The problem has been resolved since onset. The problem is controlled. Associated symptoms include anxiety and malaise/fatigue. Pertinent negatives include no headaches, peripheral edema or shortness of breath. Risk factors for coronary artery disease include dyslipidemia, obesity, sedentary lifestyle and family history. The current treatment provides moderate improvement. There is no history of kidney disease, CAD/MI, CVA or heart failure. Identifiable causes of hypertension include a thyroid problem.  Hyperlipidemia  This is a chronic problem. The current episode started more than 1 year ago. The problem is uncontrolled. Exacerbating diseases include obesity. Pertinent negatives include no shortness of breath. Current antihyperlipidemic treatment includes statins. The current treatment provides moderate improvement of lipids. Risk factors for coronary artery disease include diabetes mellitus, dyslipidemia, obesity and a sedentary lifestyle.  Thyroid Problem  Presents for follow-up visit. Symptoms include anxiety, dry skin, fatigue and hoarse voice. Patient reports no depressed mood. The symptoms have been stable. Her past medical history is significant for hyperlipidemia. There is no history of heart failure.  Gastroesophageal Reflux  She complains of heartburn and a hoarse voice. She reports no belching. This is a chronic problem. The current episode started more than 1 year ago. The problem occurs occasionally. The problem has been waxing and waning. Associated symptoms include fatigue. Risk factors include obesity. She has tried a PPI for the symptoms. The treatment provided  moderate relief.  Back Pain  This is a chronic problem. The current episode started more than 1 year ago. The problem occurs intermittently. The problem has been waxing and waning since onset. The pain is present in the lumbar spine. The pain is at a severity of 8/10. The pain is moderate. The symptoms are aggravated by bending. Pertinent negatives include no headaches.  Depression         This is a chronic problem.  The current episode started more than 1 year ago.   The onset quality is gradual.   The problem occurs intermittently.  The problem has been waxing and waning since onset.  Associated symptoms include fatigue, helplessness, hopelessness, irritable, restlessness and sad.  Associated symptoms include no headaches.  Past treatments include nothing.  Past medical history includes thyroid problem and anxiety.   Anxiety  Presents for follow-up visit. Symptoms include excessive worry, irritability, nervous/anxious behavior and restlessness. Patient reports no depressed mood or shortness of breath. Symptoms occur occasionally. The severity of symptoms is moderate. Nighttime awakenings: none.        Review of Systems  Constitutional: Positive for fatigue, irritability and malaise/fatigue.  HENT: Positive for hoarse voice.   Respiratory: Negative for shortness of breath.   Gastrointestinal: Positive for heartburn.  Musculoskeletal: Positive for back pain.  Neurological: Negative for headaches.  Psychiatric/Behavioral: Positive for depression. The patient is nervous/anxious.   All other systems reviewed and are negative.  Breast Cancer-relatedfamily history is not on file.  Social History   Socioeconomic History  . Marital status: Legally Separated    Spouse name: Not on file  . Number of children: Not on file  . Years of education: Not on file  . Highest education level: Not on file  Occupational History  .  Not on file  Social Needs  . Financial resource strain: Not on file  .  Food insecurity:    Worry: Not on file    Inability: Not on file  . Transportation needs:    Medical: Not on file    Non-medical: Not on file  Tobacco Use  . Smoking status: Former Smoker    Last attempt to quit: 07/16/2001    Years since quitting: 16.2  . Smokeless tobacco: Never Used  Substance and Sexual Activity  . Alcohol use: No    Comment: hx alcoholism. none since age 1  . Drug use: No    Comment: none since age 54  . Sexual activity: Not on file  Lifestyle  . Physical activity:    Days per week: Not on file    Minutes per session: Not on file  . Stress: Not on file  Relationships  . Social connections:    Talks on phone: Not on file    Gets together: Not on file    Attends religious service: Not on file    Active member of club or organization: Not on file    Attends meetings of clubs or organizations: Not on file    Relationship status: Not on file  . Intimate partner violence:    Fear of current or ex partner: Not on file    Emotionally abused: Not on file    Physically abused: Not on file    Forced sexual activity: Not on file  Other Topics Concern  . Not on file  Social History Narrative  . Not on file       Objective:   Physical Exam  Constitutional: She is oriented to person, place, and time. She appears well-developed and well-nourished. She is irritable. No distress.  HENT:  Head: Normocephalic and atraumatic.  Right Ear: External ear normal.  Left Ear: External ear normal.  Nose: Nose normal.  Mouth/Throat: Oropharynx is clear and moist.  Eyes: Pupils are equal, round, and reactive to light.  Neck: Normal range of motion. Neck supple. No thyromegaly present.  Cardiovascular: Normal rate, regular rhythm, normal heart sounds and intact distal pulses.  No murmur heard. Pulmonary/Chest: Effort normal and breath sounds normal. No respiratory distress. She has no wheezes.  Abdominal: Soft. Bowel sounds are normal. She exhibits no distension. There is  no tenderness.  Musculoskeletal: Normal range of motion. She exhibits no edema or tenderness.  Neurological: She is alert and oriented to person, place, and time.  Skin: Skin is warm and dry.  Psychiatric: She has a normal mood and affect. Her behavior is normal. Judgment and thought content normal.  Vitals reviewed.    BP 125/82   Pulse 73   Temp 97.8 F (36.6 C) (Oral)   Ht 5' 2.5" (1.588 m)   Wt 178 lb 6.4 oz (80.9 kg)   BMI 32.11 kg/m      Assessment & Plan:  1. Essential hypertension - CMP14+EGFR  2. Hypothyroidism, unspecified type - CMP14+EGFR - TSH  3. Hyperlipidemia, unspecified hyperlipidemia type - CMP14+EGFR - Lipid panel  4. Chronic low back pain, unspecified back pain laterality, with sciatica presence unspecified - CMP14+EGFR  5. Depression, unspecified depression type Will start Effexor 75 mg today Stress management discussed RTO in 6 weeks - CMP14+EGFR - venlafaxine XR (EFFEXOR XR) 75 MG 24 hr capsule; Take 1 capsule (75 mg total) by mouth daily with breakfast.  Dispense: 90 capsule; Refill: 1  6. GAD (generalized anxiety disorder) - CMP14+EGFR -  venlafaxine XR (EFFEXOR XR) 75 MG 24 hr capsule; Take 1 capsule (75 mg total) by mouth daily with breakfast.  Dispense: 90 capsule; Refill: 1  7. Fatigue, unspecified type - CMP14+EGFR - TSH - VITAMIN D 25 Hydroxy (Vit-D Deficiency, Fractures) - Anemia Profile B  8. Need for hepatitis C screening test - Hepatitis C antibody  9. Encounter for screening for HIV - CMP14+EGFR - HIV antibody  10. History of colonic polyps - Ambulatory referral to Gastroenterology   Continue all meds Labs pending Health Maintenance reviewed Diet and exercise encouraged RTO 6 weeks   [name], FNP

## 2017-10-18 ENCOUNTER — Other Ambulatory Visit: Payer: Self-pay | Admitting: Family

## 2017-10-18 ENCOUNTER — Other Ambulatory Visit: Payer: Self-pay | Admitting: *Deleted

## 2017-10-18 DIAGNOSIS — E781 Pure hyperglyceridemia: Secondary | ICD-10-CM

## 2017-10-18 DIAGNOSIS — E559 Vitamin D deficiency, unspecified: Secondary | ICD-10-CM | POA: Insufficient documentation

## 2017-10-18 DIAGNOSIS — R946 Abnormal results of thyroid function studies: Secondary | ICD-10-CM

## 2017-10-18 DIAGNOSIS — D509 Iron deficiency anemia, unspecified: Secondary | ICD-10-CM | POA: Insufficient documentation

## 2017-10-18 LAB — CMP14+EGFR
ALK PHOS: 91 IU/L (ref 39–117)
ALT: 24 IU/L (ref 0–32)
AST: 22 IU/L (ref 0–40)
Albumin/Globulin Ratio: 2 (ref 1.2–2.2)
Albumin: 4.5 g/dL (ref 3.5–5.5)
BUN / CREAT RATIO: 21 (ref 9–23)
BUN: 16 mg/dL (ref 6–24)
Bilirubin Total: <0.2 mg/dL (ref 0.0–1.2)
CHLORIDE: 101 mmol/L (ref 96–106)
CO2: 27 mmol/L (ref 20–29)
Calcium: 9.6 mg/dL (ref 8.7–10.2)
Creatinine, Ser: 0.77 mg/dL (ref 0.57–1.00)
GFR calc Af Amer: 99 mL/min/{1.73_m2} (ref >59)
GFR calc non Af Amer: 86 mL/min/{1.73_m2} (ref >59)
GLUCOSE: 96 mg/dL (ref 65–99)
Globulin, Total: 2.2 g/dL (ref 1.5–4.5)
POTASSIUM: 4.9 mmol/L (ref 3.5–5.2)
Sodium: 141 mmol/L (ref 134–144)
Total Protein: 6.7 g/dL (ref 6.0–8.5)

## 2017-10-18 LAB — ANEMIA PROFILE B
Basophils Absolute: 0 10*3/uL (ref 0.0–0.2)
Basos: 1 %
EOS (ABSOLUTE): 0.2 10*3/uL (ref 0.0–0.4)
Eos: 2 %
Ferritin: 26 ng/mL (ref 15–150)
Folate: 16.6 ng/mL (ref >3.0)
Hematocrit: 38.3 % (ref 34.0–46.6)
Hemoglobin: 12.5 g/dL (ref 11.1–15.9)
IMMATURE GRANS (ABS): 0 10*3/uL (ref 0.0–0.1)
IMMATURE GRANULOCYTES: 0 %
Iron Saturation: 11 % — ABNORMAL LOW (ref 15–55)
Iron: 42 ug/dL (ref 27–159)
LYMPHS: 47 %
Lymphocytes Absolute: 2.9 10*3/uL (ref 0.7–3.1)
MCH: 28.5 pg (ref 26.6–33.0)
MCHC: 32.6 g/dL (ref 31.5–35.7)
MCV: 87 fL (ref 79–97)
MONOCYTES: 9 %
Monocytes Absolute: 0.5 10*3/uL (ref 0.1–0.9)
NEUTROS ABS: 2.5 10*3/uL (ref 1.4–7.0)
Neutrophils: 41 %
PLATELETS: 341 10*3/uL (ref 150–379)
RBC: 4.39 x10E6/uL (ref 3.77–5.28)
RDW: 14.3 % (ref 12.3–15.4)
RETIC CT PCT: 1.5 % (ref 0.6–2.6)
TIBC: 366 ug/dL (ref 250–450)
UIBC: 324 ug/dL (ref 131–425)
VITAMIN B 12: 681 pg/mL (ref 232–1245)
WBC: 6.2 10*3/uL (ref 3.4–10.8)

## 2017-10-18 LAB — HIV ANTIBODY (ROUTINE TESTING W REFLEX): HIV SCREEN 4TH GENERATION: NONREACTIVE

## 2017-10-18 LAB — LIPID PANEL
CHOLESTEROL TOTAL: 213 mg/dL — AB (ref 100–199)
Chol/HDL Ratio: 5 ratio — ABNORMAL HIGH (ref 0.0–4.4)
HDL: 43 mg/dL (ref >39)
Triglycerides: 549 mg/dL — ABNORMAL HIGH (ref 0–149)

## 2017-10-18 LAB — VITAMIN D 25 HYDROXY (VIT D DEFICIENCY, FRACTURES): VIT D 25 HYDROXY: 16.9 ng/mL — AB (ref 30.0–100.0)

## 2017-10-18 LAB — TSH: TSH: 7.16 u[IU]/mL — AB (ref 0.450–4.500)

## 2017-10-18 LAB — HEPATITIS C ANTIBODY: Hep C Virus Ab: <0.1 s/co ratio (ref 0.0–0.9)

## 2017-10-18 MED ORDER — LEVOTHYROXINE SODIUM 88 MCG PO TABS: 88.0000 ug | ORAL_TABLET | Freq: Every day | ORAL | 11 refills | Status: DC

## 2017-10-18 MED ORDER — VITAMIN D (ERGOCALCIFEROL) 1.25 MG (50000 UNIT) PO CAPS: 50000.0000 [IU] | ORAL_CAPSULE | ORAL | 3 refills | Status: DC

## 2017-10-18 MED ORDER — FENOFIBRATE 145 MG PO TABS: 145.0000 mg | ORAL_TABLET | Freq: Every day | ORAL | 5 refills | Status: DC

## 2017-10-29 DIAGNOSIS — M5417 Radiculopathy, lumbosacral region: Secondary | ICD-10-CM | POA: Diagnosis not present

## 2017-10-29 DIAGNOSIS — M545 Low back pain: Secondary | ICD-10-CM | POA: Diagnosis not present

## 2017-10-29 DIAGNOSIS — M542 Cervicalgia: Secondary | ICD-10-CM | POA: Diagnosis not present

## 2017-11-06 LAB — HM COLONOSCOPY

## 2017-11-07 DIAGNOSIS — K5730 Diverticulosis of large intestine without perforation or abscess without bleeding: Secondary | ICD-10-CM | POA: Diagnosis not present

## 2017-11-07 DIAGNOSIS — D122 Benign neoplasm of ascending colon: Secondary | ICD-10-CM | POA: Diagnosis not present

## 2017-11-07 DIAGNOSIS — Z86010 Personal history of colonic polyps: Secondary | ICD-10-CM | POA: Diagnosis not present

## 2017-11-07 DIAGNOSIS — K640 First degree hemorrhoids: Secondary | ICD-10-CM | POA: Diagnosis not present

## 2017-11-14 ENCOUNTER — Other Ambulatory Visit: Payer: Self-pay | Admitting: Physician Assistant

## 2017-12-02 ENCOUNTER — Ambulatory Visit: Payer: Medicare HMO | Admitting: Family

## 2017-12-03 DIAGNOSIS — M5417 Radiculopathy, lumbosacral region: Secondary | ICD-10-CM | POA: Diagnosis not present

## 2017-12-10 ENCOUNTER — Ambulatory Visit (INDEPENDENT_AMBULATORY_CARE_PROVIDER_SITE_OTHER): Payer: Medicare HMO | Admitting: Family

## 2017-12-10 ENCOUNTER — Encounter: Payer: Self-pay | Admitting: Family

## 2017-12-10 VITALS — BP 127/78 | HR 59 | Temp 98.9°F | Ht 62.5 in | Wt 162.0 lb

## 2017-12-10 DIAGNOSIS — E039 Hypothyroidism, unspecified: Secondary | ICD-10-CM

## 2017-12-10 DIAGNOSIS — F411 Generalized anxiety disorder: Secondary | ICD-10-CM

## 2017-12-10 DIAGNOSIS — F331 Major depressive disorder, recurrent, moderate: Secondary | ICD-10-CM

## 2017-12-10 MED ORDER — METOPROLOL TARTRATE 50 MG PO TABS: 50.0000 mg | ORAL_TABLET | Freq: Two times a day (BID) | ORAL | 1 refills | Status: DC

## 2017-12-10 MED ORDER — VENLAFAXINE HCL ER 150 MG PO CP24: 150.0000 mg | ORAL_CAPSULE | Freq: Every day | ORAL | 1 refills | Status: DC

## 2017-12-10 MED ORDER — OMEPRAZOLE 40 MG PO CPDR: 40.0000 mg | DELAYED_RELEASE_CAPSULE | Freq: Every day | ORAL | 1 refills | Status: DC

## 2017-12-10 MED ORDER — ATORVASTATIN CALCIUM 20 MG PO TABS: 20.0000 mg | ORAL_TABLET | Freq: Every day | ORAL | 1 refills | Status: DC

## 2017-12-10 NOTE — Patient Instructions (Addendum)

## 2017-12-10 NOTE — Progress Notes (Signed)
Subjective:    Patient ID: Brittany Lynch, female    DOB: 11-12-1960, 57 y.o.   MRN: [id number]  Chief Complaint  Patient presents with  . Depression    recheck   Pt presents to the office today to recheck Depression. She was seen on 10/17/17 and started Effexor 75 mg. States this is has helped, but continues to have some symptoms of insomnia, and lack of motivation.  Depression         This is a chronic problem.  The current episode started more than 1 year ago.   The onset quality is gradual.   The problem occurs intermittently.  The problem has been gradually improving since onset.  Associated symptoms include fatigue, insomnia, decreased interest, appetite change and sad.  Associated symptoms include no helplessness, no hopelessness, not irritable and no restlessness.  Past treatments include SNRIs - Serotonin and norepinephrine reuptake inhibitors.  Past medical history includes thyroid problem.   Thyroid Problem  Presents for follow-up visit. Symptoms include constipation and fatigue. Patient reports no diaphoresis, diarrhea or hoarse voice.      Review of Systems  Constitutional: Positive for appetite change and fatigue. Negative for diaphoresis.  HENT: Negative for hoarse voice.   Gastrointestinal: Positive for constipation. Negative for diarrhea.  Psychiatric/Behavioral: Positive for depression. The patient has insomnia.   All other systems reviewed and are negative.      Objective:   Physical Exam  Constitutional: She is oriented to person, place, and time. She appears well-developed and well-nourished. She is not irritable. No distress.  HENT:  Head: Normocephalic and atraumatic.  Right Ear: External ear normal.  Left Ear: External ear normal.  Mouth/Throat: Oropharynx is clear and moist.  Eyes: Pupils are equal, round, and reactive to light.  Neck: Normal range of motion. Neck supple. No thyromegaly present.  Cardiovascular: Normal rate, regular rhythm, normal  heart sounds and intact distal pulses.  No murmur heard. Pulmonary/Chest: Effort normal and breath sounds normal. No respiratory distress. She has no wheezes.  Abdominal: Soft. Bowel sounds are normal. She exhibits no distension. There is no tenderness.  Musculoskeletal: Normal range of motion. She exhibits no edema or tenderness.  Neurological: She is alert and oriented to person, place, and time. She has normal reflexes. No cranial nerve deficit.  Skin: Skin is warm and dry.  Psychiatric: She has a normal mood and affect. Her behavior is normal. Judgment and thought content normal.  Vitals reviewed.    BP 127/78   Pulse (!) 59   Temp 98.9 F (37.2 C) (Oral)   Ht 5' 2.5" (1.588 m)   Wt 162 lb (73.5 kg)   BMI 29.16 kg/m      Assessment & Plan:  Brittany Lynch comes in today with chief complaint of Depression (recheck)   Diagnosis and orders addressed:  1. Moderate episode of recurrent major depressive disorder (HCC) Will increase Effexor to 150 mg from 75 mg Stress management discussed RTO 2 months  - BMP8+EGFR - venlafaxine XR (EFFEXOR XR) 150 MG 24 hr capsule; Take 1 capsule (150 mg total) by mouth daily with breakfast.  Dispense: 90 capsule; Refill: 1  2. GAD (generalized anxiety disorder) - BMP8+EGFR - venlafaxine XR (EFFEXOR XR) 150 MG 24 hr capsule; Take 1 capsule (150 mg total) by mouth daily with breakfast.  Dispense: 90 capsule; Refill: 1  3. Hypothyroidism, unspecified type - BMP8+EGFR - TSH   Labs pending Health Maintenance reviewed Diet and exercise encouraged  Follow up  plan: 2 months    [name], FNP

## 2017-12-11 LAB — TSH: TSH: 1.6 u[IU]/mL (ref 0.450–4.500)

## 2017-12-11 LAB — BMP8+EGFR
BUN/Creatinine Ratio: 18 (ref 9–23)
BUN: 16 mg/dL (ref 6–24)
CHLORIDE: 99 mmol/L (ref 96–106)
CO2: 23 mmol/L (ref 20–29)
Calcium: 9.7 mg/dL (ref 8.7–10.2)
Creatinine, Ser: 0.9 mg/dL (ref 0.57–1.00)
GFR calc Af Amer: 82 mL/min/{1.73_m2} (ref >59)
GFR, EST NON AFRICAN AMERICAN: 71 mL/min/{1.73_m2} (ref >59)
Glucose: 97 mg/dL (ref 65–99)
POTASSIUM: 4.4 mmol/L (ref 3.5–5.2)
SODIUM: 140 mmol/L (ref 134–144)

## 2017-12-30 DIAGNOSIS — M5417 Radiculopathy, lumbosacral region: Secondary | ICD-10-CM | POA: Diagnosis not present

## 2018-01-29 DIAGNOSIS — M48061 Spinal stenosis, lumbar region without neurogenic claudication: Secondary | ICD-10-CM | POA: Diagnosis not present

## 2018-01-29 DIAGNOSIS — M5417 Radiculopathy, lumbosacral region: Secondary | ICD-10-CM | POA: Diagnosis not present

## 2018-01-29 DIAGNOSIS — M542 Cervicalgia: Secondary | ICD-10-CM | POA: Diagnosis not present

## 2018-01-29 DIAGNOSIS — I10 Essential (primary) hypertension: Secondary | ICD-10-CM | POA: Diagnosis not present

## 2018-02-11 ENCOUNTER — Encounter: Payer: Self-pay | Admitting: Family

## 2018-02-11 ENCOUNTER — Ambulatory Visit (INDEPENDENT_AMBULATORY_CARE_PROVIDER_SITE_OTHER): Payer: Medicare HMO | Admitting: Family

## 2018-02-11 VITALS — BP 118/77 | HR 67 | Temp 99.0°F | Ht 62.5 in | Wt 164.8 lb

## 2018-02-11 DIAGNOSIS — F411 Generalized anxiety disorder: Secondary | ICD-10-CM | POA: Diagnosis not present

## 2018-02-11 DIAGNOSIS — F331 Major depressive disorder, recurrent, moderate: Secondary | ICD-10-CM | POA: Diagnosis not present

## 2018-02-11 MED ORDER — BUSPIRONE HCL 10 MG PO TABS: 10.0000 mg | ORAL_TABLET | Freq: Three times a day (TID) | ORAL | 1 refills | Status: DC

## 2018-02-11 NOTE — Progress Notes (Signed)
   Subjective:    Patient ID: YOHANA BARTHA, female    DOB: 06-03-1961, 57 y.o.   MRN: [id number]  Chief Complaint  Patient presents with  . depression recheck   Pt presents to the office today to recheck depression and GAD. She was seen on 12/10/17 and we increased her Effexor to 150 mg from 75 mcg. States she is doing much better and not sleeping 14+ hours a day. States she has had a couple of panic attacks in which she had palpitations, SOB, and increased worry.  Depression         This is a chronic problem.  The current episode started more than 1 year ago.   The onset quality is gradual.   The problem occurs intermittently.  The problem has been gradually improving since onset.  Associated symptoms include fatigue (improved), insomnia and sad.  Associated symptoms include no helplessness, no hopelessness, not irritable and no restlessness.  Past treatments include SNRIs - Serotonin and norepinephrine reuptake inhibitors.  Compliance with treatment is good.  Past medical history includes anxiety.   Anxiety  Presents for follow-up visit. Symptoms include depressed mood, excessive worry, insomnia, nervous/anxious behavior and panic. Patient reports no restlessness. Symptoms occur occasionally. The severity of symptoms is moderate.        Review of Systems  Constitutional: Positive for fatigue (improved).  Psychiatric/Behavioral: Positive for depression. The patient is nervous/anxious and has insomnia.   All other systems reviewed and are negative.      Objective:   Physical Exam  Constitutional: She is oriented to person, place, and time. She appears well-developed and well-nourished. She is not irritable. No distress.  HENT:  Head: Normocephalic and atraumatic.  Right Ear: External ear normal.  Left Ear: External ear normal.  Mouth/Throat: Oropharynx is clear and moist.  Eyes: Pupils are equal, round, and reactive to light.  Neck: Normal range of motion. Neck supple. No  thyromegaly present.  Cardiovascular: Normal rate, regular rhythm, normal heart sounds and intact distal pulses.  No murmur heard. Pulmonary/Chest: Effort normal and breath sounds normal. No respiratory distress. She has no wheezes.  Abdominal: Soft. Bowel sounds are normal. She exhibits no distension. There is no tenderness.  Musculoskeletal: Normal range of motion. She exhibits no edema or tenderness.  Neurological: She is alert and oriented to person, place, and time. She has normal reflexes. No cranial nerve deficit.  Skin: Skin is warm and dry.  Psychiatric: She has a normal mood and affect. Her behavior is normal. Judgment and thought content normal.  Vitals reviewed.     BP 118/77   Pulse 67   Temp 99 F (37.2 C) (Oral) Comment: Drinking coffee.  Ht 5' 2.5" (1.588 m)   Wt 164 lb 12.8 oz (74.8 kg)   BMI 29.66 kg/m      Assessment & Plan:  TYMBER STALLINGS comes in today with chief complaint of depression recheck   Diagnosis and orders addressed:  1. Moderate episode of recurrent major depressive disorder (HCC) Continue Effexor  Stress management discussed  2. GAD (generalized anxiety disorder) Will add Buspar today Continue Effexor - busPIRone (BUSPAR) 10 MG tablet; Take 1 tablet (10 mg total) by mouth 3 (three) times daily.  Dispense: 90 tablet; Refill: 1   Health Maintenance reviewed Diet and exercise encouraged  Follow up plan: 6 months    [name], FNP

## 2018-02-11 NOTE — Patient Instructions (Signed)

## 2018-02-18 ENCOUNTER — Other Ambulatory Visit: Payer: Self-pay | Admitting: Neurological Surgery

## 2018-02-27 ENCOUNTER — Encounter (HOSPITAL_COMMUNITY): Payer: Self-pay

## 2018-02-27 ENCOUNTER — Encounter (HOSPITAL_COMMUNITY)
Admission: RE | Admit: 2018-02-27 | Discharge: 2018-02-27 | Disposition: A | Discharge status: Home / Self Care | Payer: Medicare HMO | Source: Ambulatory Visit | Attending: Neurological Surgery | Admitting: Neurological Surgery

## 2018-02-27 ENCOUNTER — Other Ambulatory Visit: Payer: Self-pay

## 2018-02-27 ENCOUNTER — Ambulatory Visit (HOSPITAL_COMMUNITY)
Admission: RE | Admit: 2018-02-27 | Discharge: 2018-02-27 | Disposition: A | Discharge status: Home / Self Care | Payer: Medicare HMO | Source: Ambulatory Visit | Attending: Neurological Surgery | Admitting: Neurological Surgery

## 2018-02-27 DIAGNOSIS — R001 Bradycardia, unspecified: Secondary | ICD-10-CM | POA: Insufficient documentation

## 2018-02-27 DIAGNOSIS — Z01812 Encounter for preprocedural laboratory examination: Secondary | ICD-10-CM | POA: Insufficient documentation

## 2018-02-27 DIAGNOSIS — M48061 Spinal stenosis, lumbar region without neurogenic claudication: Secondary | ICD-10-CM | POA: Insufficient documentation

## 2018-02-27 DIAGNOSIS — Z01818 Encounter for other preprocedural examination: Secondary | ICD-10-CM | POA: Insufficient documentation

## 2018-02-27 LAB — BASIC METABOLIC PANEL
Anion gap: 9 (ref 5–15)
BUN: 9 mg/dL (ref 6–20)
CO2: 27 mmol/L (ref 22–32)
Calcium: 9.4 mg/dL (ref 8.9–10.3)
Chloride: 103 mmol/L (ref 98–111)
Creatinine, Ser: 0.7 mg/dL (ref 0.44–1.00)
GFR calc Af Amer: >60 mL/min (ref >60)
GFR calc non Af Amer: >60 mL/min (ref >60)
Glucose, Bld: 101 mg/dL — ABNORMAL HIGH (ref 70–99)
POTASSIUM: 4.1 mmol/L (ref 3.5–5.1)
Sodium: 139 mmol/L (ref 135–145)

## 2018-02-27 LAB — CBC WITH DIFFERENTIAL/PLATELET
Abs Immature Granulocytes: 0 10*3/uL (ref 0.0–0.1)
Basophils Absolute: 0.1 10*3/uL (ref 0.0–0.1)
Basophils Relative: 1 %
Eosinophils Absolute: 0.1 10*3/uL (ref 0.0–0.7)
Eosinophils Relative: 1 %
HCT: 43.4 % (ref 36.0–46.0)
Hemoglobin: 14.2 g/dL (ref 12.0–15.0)
Immature Granulocytes: 0 %
Lymphocytes Relative: 43 %
Lymphs Abs: 3.3 10*3/uL (ref 0.7–4.0)
MCH: 29.4 pg (ref 26.0–34.0)
MCHC: 32.7 g/dL (ref 30.0–36.0)
MCV: 89.9 fL (ref 78.0–100.0)
Monocytes Absolute: 0.6 10*3/uL (ref 0.1–1.0)
Monocytes Relative: 8 %
Neutro Abs: 3.6 10*3/uL (ref 1.7–7.7)
Neutrophils Relative %: 47 %
Platelets: 361 10*3/uL (ref 150–400)
RBC: 4.83 MIL/uL (ref 3.87–5.11)
RDW: 13.6 % (ref 11.5–15.5)
WBC: 7.7 10*3/uL (ref 4.0–10.5)

## 2018-02-27 LAB — SURGICAL PCR SCREEN
MRSA, PCR: NEGATIVE
STAPHYLOCOCCUS AUREUS: POSITIVE — AB

## 2018-02-27 LAB — PROTIME-INR
INR: 0.92
Prothrombin Time: 12.2 seconds (ref 11.4–15.2)

## 2018-02-27 NOTE — Pre-Procedure Instructions (Addendum)
[place]  02/27/2018      [facility], [place] [place] [address] Phone: [phone] Fax: [phone]  [place] - [address] [facility] [place] [address] Phone: [phone] Fax: [phone]  [facility] - [place], [place] [address] Phone: [phone] Fax: [phone]    Your procedure is scheduled on March 06, 2018.  Report to [facility] Admitting at 915 AM.  Call this number if you have problems the morning of surgery:  [phone]   Remember:  Do not eat or drink after midnight.     Take these medicines the morning of surgery with A SIP OF WATER  Metoprolol tartrate (lopressor) Buspirone (buspar)-if needed Cyclobenzaprine (flexeril) Levothyroxine (synthroid) Oxycodone-if needed for pain Venlafaxine XR (Effexor XR)  7 days prior to surgery STOP taking any Aspirin (unless otherwise instructed by your surgeon), Aleve, Naproxen, Ibuprofen, Motrin, Advil, Goody's, BC's, all herbal medications, fish oil, and all vitamins    [place]- Preparing For Surgery  Before surgery, you can play an important role. Because skin is not sterile, your skin needs to be as free of germs as possible. You can reduce the number of germs on your skin by washing with CHG (chlorahexidine gluconate) Soap before surgery.  CHG is an antiseptic cleaner which kills germs and bonds with the skin to continue killing germs even after washing.    Oral Hygiene is also important to reduce your risk of infection.  Remember - BRUSH YOUR TEETH THE MORNING OF SURGERY WITH YOUR REGULAR TOOTHPASTE  Please do not use if you have an allergy to CHG or antibacterial soaps. If your skin becomes reddened/irritated stop using the CHG.  Do not shave (including legs and underarms) for at least 48 hours prior to first CHG  shower. It is OK to shave your face.  Please follow these instructions carefully.   1. Shower the NIGHT BEFORE SURGERY and the MORNING OF SURGERY with CHG.   2. If you chose to wash your hair, wash your hair first as usual with your normal shampoo.  3. After you shampoo, rinse your hair and body thoroughly to remove the shampoo.  4. Use CHG as you would any other liquid soap. You can apply CHG directly to the skin and wash gently with a scrungie or a clean washcloth.   5. Apply the CHG Soap to your body ONLY FROM THE NECK DOWN.  Do not use on open wounds or open sores. Avoid contact with your eyes, ears, mouth and genitals (private parts). Wash Face and genitals (private parts)  with your normal soap.  6. Wash thoroughly, paying special attention to the area where your surgery will be performed.  7. Thoroughly rinse your body with warm water from the neck down.  8. DO NOT shower/wash with your normal soap after using and rinsing off the CHG Soap.  9. Pat yourself dry with a CLEAN TOWEL.  10. Wear CLEAN PAJAMAS to bed the night before surgery, wear comfortable clothes the morning of surgery  11. Place CLEAN SHEETS on your bed the night of your first shower and DO NOT SLEEP WITH PETS.  Day of Surgery:  Do not apply any deodorants/lotions.  Please wear clean clothes to the hospital/surgery center.   Remember to brush your teeth WITH YOUR REGULAR TOOTHPASTE.  Do not wear jewelry, make-up or nail polish.  Do not wear lotions, powders, or perfumes, or deodorant.  Do not shave 48 hours prior to surgery.    Do not bring valuables to the hospital.  [facility] is not responsible for any belongings or valuables.  Contacts, dentures or bridgework may not be worn into surgery.  Leave your suitcase in the car.  After surgery it may be brought to your room.  For patients admitted to the hospital, discharge time will be determined by your treatment team.  Patients  discharged the day of surgery will not be allowed to drive home.   Please read over the following fact sheets that you were given. Pain Booklet, Coughing and Deep Breathing, MRSA Information and Surgical Site Infection Prevention

## 2018-02-27 NOTE — Progress Notes (Signed)
I called a prescription for Mupirocin ointment to [facility].

## 2018-02-27 NOTE — Progress Notes (Addendum)
PCP: [name], FNP  Cardiologist: pt denies  EKG: obtained today per order  Stress test: pt denies  ECHO: pt denies  Cardiac Cath: pt denies  Chest x-ray: obtained today per order

## 2018-02-28 ENCOUNTER — Other Ambulatory Visit: Payer: Self-pay

## 2018-02-28 MED ORDER — FENOFIBRATE 145 MG PO TABS: 145.0000 mg | ORAL_TABLET | Freq: Every day | ORAL | 5 refills | Status: DC

## 2018-03-06 ENCOUNTER — Encounter (HOSPITAL_COMMUNITY): Payer: Self-pay | Admitting: Neurological Surgery

## 2018-03-06 ENCOUNTER — Ambulatory Visit (HOSPITAL_COMMUNITY): Payer: Medicare HMO | Admitting: Certified Registered"

## 2018-03-06 ENCOUNTER — Observation Stay (HOSPITAL_COMMUNITY)
Admission: RE | Admit: 2018-03-06 | Discharge: 2018-03-07 | Disposition: A | Discharge status: Home / Self Care | Payer: Medicare HMO | Source: Ambulatory Visit | Attending: Neurological Surgery | Admitting: Neurological Surgery

## 2018-03-06 ENCOUNTER — Encounter (HOSPITAL_COMMUNITY)
Admission: RE | Disposition: A | Discharge status: Home / Self Care | Payer: Self-pay | Source: Ambulatory Visit | Attending: Neurological Surgery

## 2018-03-06 ENCOUNTER — Ambulatory Visit (HOSPITAL_COMMUNITY): Payer: Medicare HMO

## 2018-03-06 ENCOUNTER — Other Ambulatory Visit: Payer: Self-pay

## 2018-03-06 DIAGNOSIS — M48061 Spinal stenosis, lumbar region without neurogenic claudication: Secondary | ICD-10-CM | POA: Diagnosis present

## 2018-03-06 DIAGNOSIS — E785 Hyperlipidemia, unspecified: Secondary | ICD-10-CM | POA: Insufficient documentation

## 2018-03-06 DIAGNOSIS — E039 Hypothyroidism, unspecified: Secondary | ICD-10-CM | POA: Diagnosis not present

## 2018-03-06 DIAGNOSIS — I10 Essential (primary) hypertension: Secondary | ICD-10-CM | POA: Diagnosis not present

## 2018-03-06 DIAGNOSIS — Z87891 Personal history of nicotine dependence: Secondary | ICD-10-CM | POA: Insufficient documentation

## 2018-03-06 DIAGNOSIS — Z79890 Hormone replacement therapy (postmenopausal): Secondary | ICD-10-CM | POA: Insufficient documentation

## 2018-03-06 DIAGNOSIS — Z9889 Other specified postprocedural states: Secondary | ICD-10-CM

## 2018-03-06 DIAGNOSIS — Z79891 Long term (current) use of opiate analgesic: Secondary | ICD-10-CM | POA: Diagnosis not present

## 2018-03-06 DIAGNOSIS — Z79899 Other long term (current) drug therapy: Secondary | ICD-10-CM | POA: Insufficient documentation

## 2018-03-06 DIAGNOSIS — Z419 Encounter for procedure for purposes other than remedying health state, unspecified: Secondary | ICD-10-CM

## 2018-03-06 DIAGNOSIS — F419 Anxiety disorder, unspecified: Secondary | ICD-10-CM | POA: Diagnosis not present

## 2018-03-06 DIAGNOSIS — F329 Major depressive disorder, single episode, unspecified: Secondary | ICD-10-CM | POA: Diagnosis not present

## 2018-03-06 HISTORY — PX: LUMBAR LAMINECTOMY/DECOMPRESSION MICRODISCECTOMY: SHX5026

## 2018-03-06 SURGERY — LUMBAR LAMINECTOMY/DECOMPRESSION MICRODISCECTOMY 2 LEVELS
Anesthesia: General | Site: Back | Laterality: Bilateral

## 2018-03-06 MED ORDER — GLYCOPYRROLATE 0.2 MG/ML IJ SOLN
INTRAMUSCULAR | Status: DC | PRN
  Administered 2018-03-06: 0.1 mg via INTRAVENOUS

## 2018-03-06 MED ORDER — PHENOL 1.4 % MT LIQD: 1.0000 | OROMUCOSAL | Status: DC | PRN

## 2018-03-06 MED ORDER — SENNA 8.6 MG PO TABS
1.0000 | ORAL_TABLET | Freq: Two times a day (BID) | ORAL | Status: DC
  Administered 2018-03-06 – 2018-03-07 (×3): 8.6 mg via ORAL
  Filled 2018-03-06 (×3): qty 1

## 2018-03-06 MED ORDER — MIDAZOLAM HCL 2 MG/2ML IJ SOLN
INTRAMUSCULAR | Status: AC
  Filled 2018-03-06: qty 2

## 2018-03-06 MED ORDER — CEFAZOLIN SODIUM-DEXTROSE 2-4 GM/100ML-% IV SOLN
INTRAVENOUS | Status: AC
Start: 2018-03-06 — End: 2018-03-06
  Filled 2018-03-06: qty 100

## 2018-03-06 MED ORDER — PHENYLEPHRINE 40 MCG/ML (10ML) SYRINGE FOR IV PUSH (FOR BLOOD PRESSURE SUPPORT)
PREFILLED_SYRINGE | INTRAVENOUS | Status: AC
  Filled 2018-03-06: qty 10

## 2018-03-06 MED ORDER — CEFAZOLIN SODIUM-DEXTROSE 2-4 GM/100ML-% IV SOLN
2.0000 g | INTRAVENOUS | Status: AC
  Administered 2018-03-06: 2 g via INTRAVENOUS

## 2018-03-06 MED ORDER — OXYCODONE HCL 5 MG PO TABS
5.0000 mg | ORAL_TABLET | ORAL | Status: DC | PRN
  Administered 2018-03-06 – 2018-03-07 (×3): 10 mg via ORAL
  Filled 2018-03-06 (×3): qty 2

## 2018-03-06 MED ORDER — HYDROMORPHONE HCL 1 MG/ML IJ SOLN
INTRAMUSCULAR | Status: AC
  Filled 2018-03-06: qty 1

## 2018-03-06 MED ORDER — LIDOCAINE 2% (20 MG/ML) 5 ML SYRINGE
INTRAMUSCULAR | Status: DC | PRN
  Administered 2018-03-06: 100 mg via INTRAVENOUS

## 2018-03-06 MED ORDER — ACETAMINOPHEN 325 MG PO TABS
650.0000 mg | ORAL_TABLET | ORAL | Status: DC | PRN
  Administered 2018-03-07: 650 mg via ORAL
  Filled 2018-03-06: qty 2

## 2018-03-06 MED ORDER — DEXAMETHASONE 4 MG PO TABS
4.0000 mg | ORAL_TABLET | Freq: Four times a day (QID) | ORAL | Status: DC
  Administered 2018-03-06 – 2018-03-07 (×3): 4 mg via ORAL
  Filled 2018-03-06 (×3): qty 1

## 2018-03-06 MED ORDER — CELECOXIB 200 MG PO CAPS
200.0000 mg | ORAL_CAPSULE | Freq: Two times a day (BID) | ORAL | Status: DC
  Administered 2018-03-06 – 2018-03-07 (×3): 200 mg via ORAL
  Filled 2018-03-06 (×3): qty 1

## 2018-03-06 MED ORDER — CYCLOBENZAPRINE HCL 10 MG PO TABS
10.0000 mg | ORAL_TABLET | Freq: Three times a day (TID) | ORAL | Status: DC | PRN
  Administered 2018-03-06 – 2018-03-07 (×3): 10 mg via ORAL
  Filled 2018-03-06 (×3): qty 1

## 2018-03-06 MED ORDER — ONDANSETRON HCL 4 MG/2ML IJ SOLN
INTRAMUSCULAR | Status: AC
  Filled 2018-03-06: qty 2

## 2018-03-06 MED ORDER — MENTHOL 3 MG MT LOZG: 1.0000 | LOZENGE | OROMUCOSAL | Status: DC | PRN

## 2018-03-06 MED ORDER — SODIUM CHLORIDE 0.9% FLUSH: 3.0000 mL | INTRAVENOUS | Status: DC | PRN

## 2018-03-06 MED ORDER — LUBIPROSTONE 24 MCG PO CAPS
24.0000 ug | ORAL_CAPSULE | Freq: Every day | ORAL | Status: DC
  Administered 2018-03-06: 24 ug via ORAL
  Filled 2018-03-06 (×2): qty 1

## 2018-03-06 MED ORDER — SUCCINYLCHOLINE CHLORIDE 200 MG/10ML IV SOSY
PREFILLED_SYRINGE | INTRAVENOUS | Status: DC | PRN
  Administered 2018-03-06: 120 mg via INTRAVENOUS

## 2018-03-06 MED ORDER — PROPOFOL 10 MG/ML IV BOLUS
INTRAVENOUS | Status: AC
  Filled 2018-03-06: qty 20

## 2018-03-06 MED ORDER — BUPIVACAINE HCL (PF) 0.25 % IJ SOLN
INTRAMUSCULAR | Status: AC
  Filled 2018-03-06: qty 30

## 2018-03-06 MED ORDER — HYDROMORPHONE HCL 1 MG/ML IJ SOLN
0.5000 mg | INTRAMUSCULAR | Status: DC | PRN
  Administered 2018-03-06 – 2018-03-07 (×3): 0.5 mg via INTRAVENOUS
  Filled 2018-03-06 (×3): qty 0.5

## 2018-03-06 MED ORDER — ONDANSETRON HCL 4 MG PO TABS: 4.0000 mg | ORAL_TABLET | Freq: Four times a day (QID) | ORAL | Status: DC | PRN

## 2018-03-06 MED ORDER — 0.9 % SODIUM CHLORIDE (POUR BTL) OPTIME
TOPICAL | Status: DC | PRN
  Administered 2018-03-06: 1000 mL

## 2018-03-06 MED ORDER — THROMBIN 5000 UNITS EX SOLR
CUTANEOUS | Status: AC
  Filled 2018-03-06: qty 10000

## 2018-03-06 MED ORDER — GLYCOPYRROLATE PF 0.2 MG/ML IJ SOSY
PREFILLED_SYRINGE | INTRAMUSCULAR | Status: AC
  Filled 2018-03-06: qty 1

## 2018-03-06 MED ORDER — DEXAMETHASONE SODIUM PHOSPHATE 10 MG/ML IJ SOLN: 10.0000 mg | INTRAMUSCULAR | Status: DC

## 2018-03-06 MED ORDER — ONDANSETRON HCL 4 MG/2ML IJ SOLN
INTRAMUSCULAR | Status: DC | PRN
  Administered 2018-03-06: 4 mg via INTRAVENOUS

## 2018-03-06 MED ORDER — CALCIUM CARBONATE ANTACID 500 MG PO CHEW
1.0000 | CHEWABLE_TABLET | Freq: Once | ORAL | Status: AC
  Administered 2018-03-06: 200 mg via ORAL
  Filled 2018-03-06: qty 1

## 2018-03-06 MED ORDER — FENTANYL CITRATE (PF) 250 MCG/5ML IJ SOLN
INTRAMUSCULAR | Status: AC
  Filled 2018-03-06: qty 5

## 2018-03-06 MED ORDER — SODIUM CHLORIDE 0.9 % IV SOLN
INTRAVENOUS | Status: DC | PRN
  Administered 2018-03-06: 60 ug/min via INTRAVENOUS

## 2018-03-06 MED ORDER — FENTANYL CITRATE (PF) 100 MCG/2ML IJ SOLN
INTRAMUSCULAR | Status: DC | PRN
  Administered 2018-03-06: 50 ug via INTRAVENOUS
  Administered 2018-03-06 (×2): 100 ug via INTRAVENOUS

## 2018-03-06 MED ORDER — MIDAZOLAM HCL 5 MG/5ML IJ SOLN
INTRAMUSCULAR | Status: DC | PRN
  Administered 2018-03-06: 2 mg via INTRAVENOUS

## 2018-03-06 MED ORDER — DEXAMETHASONE SODIUM PHOSPHATE 4 MG/ML IJ SOLN
INTRAMUSCULAR | Status: DC | PRN
  Administered 2018-03-06: 10 mg via INTRAVENOUS

## 2018-03-06 MED ORDER — GABAPENTIN 600 MG PO TABS
600.0000 mg | ORAL_TABLET | Freq: Every day | ORAL | Status: DC
  Administered 2018-03-06: 600 mg via ORAL
  Filled 2018-03-06: qty 1

## 2018-03-06 MED ORDER — BUPIVACAINE HCL (PF) 0.25 % IJ SOLN
INTRAMUSCULAR | Status: DC | PRN
  Administered 2018-03-06: 14 mL

## 2018-03-06 MED ORDER — ALUM & MAG HYDROXIDE-SIMETH 200-200-20 MG/5ML PO SUSP: 30.0000 mL | Freq: Once | ORAL | Status: DC

## 2018-03-06 MED ORDER — DEXAMETHASONE SODIUM PHOSPHATE 10 MG/ML IJ SOLN
INTRAMUSCULAR | Status: AC
  Filled 2018-03-06: qty 1

## 2018-03-06 MED ORDER — SODIUM CHLORIDE 0.9% FLUSH: 3.0000 mL | Freq: Two times a day (BID) | INTRAVENOUS | Status: DC

## 2018-03-06 MED ORDER — LIDOCAINE 2% (20 MG/ML) 5 ML SYRINGE
INTRAMUSCULAR | Status: AC
  Filled 2018-03-06: qty 5

## 2018-03-06 MED ORDER — HEMOSTATIC AGENTS (NO CHARGE) OPTIME
TOPICAL | Status: DC | PRN
  Administered 2018-03-06: 1 via TOPICAL

## 2018-03-06 MED ORDER — OXYCODONE HCL 5 MG PO TABS
5.0000 mg | ORAL_TABLET | Freq: Three times a day (TID) | ORAL | Status: DC | PRN
  Administered 2018-03-06: 10 mg via ORAL

## 2018-03-06 MED ORDER — OXYCODONE HCL 5 MG PO TABS
5.0000 mg | ORAL_TABLET | Freq: Four times a day (QID) | ORAL | Status: DC | PRN
  Administered 2018-03-06: 10 mg via ORAL
  Filled 2018-03-06: qty 2

## 2018-03-06 MED ORDER — SODIUM CHLORIDE 0.9 % IV SOLN
INTRAVENOUS | Status: DC | PRN
  Administered 2018-03-06: 12:00:00

## 2018-03-06 MED ORDER — SUGAMMADEX SODIUM 200 MG/2ML IV SOLN
INTRAVENOUS | Status: DC | PRN
  Administered 2018-03-06: 200 mg via INTRAVENOUS

## 2018-03-06 MED ORDER — POTASSIUM CHLORIDE IN NACL 20-0.9 MEQ/L-% IV SOLN: INTRAVENOUS | Status: DC

## 2018-03-06 MED ORDER — LEVOTHYROXINE SODIUM 88 MCG PO TABS
88.0000 ug | ORAL_TABLET | Freq: Every day | ORAL | Status: DC
  Administered 2018-03-07: 88 ug via ORAL
  Filled 2018-03-06: qty 1

## 2018-03-06 MED ORDER — LACTATED RINGERS IV SOLN
INTRAVENOUS | Status: DC
  Administered 2018-03-06: 10:00:00 via INTRAVENOUS

## 2018-03-06 MED ORDER — DEXAMETHASONE SODIUM PHOSPHATE 4 MG/ML IJ SOLN: 4.0000 mg | Freq: Four times a day (QID) | INTRAMUSCULAR | Status: DC

## 2018-03-06 MED ORDER — HYDROMORPHONE HCL 1 MG/ML IJ SOLN
0.2500 mg | INTRAMUSCULAR | Status: DC | PRN
  Administered 2018-03-06 (×4): 0.5 mg via INTRAVENOUS

## 2018-03-06 MED ORDER — ACETAMINOPHEN 650 MG RE SUPP: 650.0000 mg | RECTAL | Status: DC | PRN

## 2018-03-06 MED ORDER — LACTATED RINGERS IV SOLN
INTRAVENOUS | Status: DC | PRN
  Administered 2018-03-06 (×2): via INTRAVENOUS

## 2018-03-06 MED ORDER — SUCCINYLCHOLINE CHLORIDE 200 MG/10ML IV SOSY
PREFILLED_SYRINGE | INTRAVENOUS | Status: AC
  Filled 2018-03-06: qty 10

## 2018-03-06 MED ORDER — CHLORHEXIDINE GLUCONATE CLOTH 2 % EX PADS: 6.0000 | MEDICATED_PAD | Freq: Once | CUTANEOUS | Status: DC

## 2018-03-06 MED ORDER — METOPROLOL TARTRATE 25 MG PO TABS
50.0000 mg | ORAL_TABLET | Freq: Two times a day (BID) | ORAL | Status: DC
  Administered 2018-03-06 – 2018-03-07 (×2): 50 mg via ORAL
  Filled 2018-03-06 (×2): qty 2

## 2018-03-06 MED ORDER — ROCURONIUM BROMIDE 100 MG/10ML IV SOLN
INTRAVENOUS | Status: DC | PRN
  Administered 2018-03-06: 50 mg via INTRAVENOUS

## 2018-03-06 MED ORDER — CEFAZOLIN SODIUM-DEXTROSE 2-4 GM/100ML-% IV SOLN
2.0000 g | Freq: Three times a day (TID) | INTRAVENOUS | Status: AC
  Administered 2018-03-06 – 2018-03-07 (×2): 2 g via INTRAVENOUS
  Filled 2018-03-06 (×2): qty 100

## 2018-03-06 MED ORDER — PROPOFOL 10 MG/ML IV BOLUS
INTRAVENOUS | Status: DC | PRN
  Administered 2018-03-06: 160 mg via INTRAVENOUS

## 2018-03-06 MED ORDER — OXYCODONE HCL 5 MG PO TABS
ORAL_TABLET | ORAL | Status: AC
  Filled 2018-03-06: qty 2

## 2018-03-06 MED ORDER — VENLAFAXINE HCL ER 75 MG PO CP24
150.0000 mg | ORAL_CAPSULE | Freq: Every day | ORAL | Status: DC
  Administered 2018-03-07: 150 mg via ORAL
  Filled 2018-03-06: qty 2

## 2018-03-06 MED ORDER — ROCURONIUM BROMIDE 50 MG/5ML IV SOSY
PREFILLED_SYRINGE | INTRAVENOUS | Status: AC
  Filled 2018-03-06: qty 5

## 2018-03-06 MED ORDER — ONDANSETRON HCL 4 MG/2ML IJ SOLN: 4.0000 mg | Freq: Four times a day (QID) | INTRAMUSCULAR | Status: DC | PRN

## 2018-03-06 MED ORDER — THROMBIN 5000 UNITS EX SOLR
CUTANEOUS | Status: DC | PRN
  Administered 2018-03-06 (×2): 5000 [IU] via TOPICAL

## 2018-03-06 MED ORDER — PHENYLEPHRINE 40 MCG/ML (10ML) SYRINGE FOR IV PUSH (FOR BLOOD PRESSURE SUPPORT)
PREFILLED_SYRINGE | INTRAVENOUS | Status: DC | PRN
  Administered 2018-03-06 (×3): 80 ug via INTRAVENOUS

## 2018-03-06 SURGICAL SUPPLY — 49 items
BAG DECANTER FOR FLEXI CONT (MISCELLANEOUS) ×3 IMPLANT
BENZOIN TINCTURE PRP APPL 2/3 (GAUZE/BANDAGES/DRESSINGS) ×3 IMPLANT
BUR MATCHSTICK NEURO 3.0 LAGG (BURR) ×3 IMPLANT
CANISTER SUCT 3000ML PPV (MISCELLANEOUS) ×3 IMPLANT
CARTRIDGE OIL MAESTRO DRILL (MISCELLANEOUS) ×1 IMPLANT
CLOSURE STERI-STRIP 1/2X4 (GAUZE/BANDAGES/DRESSINGS) ×1
CLOSURE WOUND 1/2 X4 (GAUZE/BANDAGES/DRESSINGS) ×1
CLSR STERI-STRIP ANTIMIC 1/2X4 (GAUZE/BANDAGES/DRESSINGS) ×2 IMPLANT
DERMABOND ADVANCED (GAUZE/BANDAGES/DRESSINGS) ×2
DERMABOND ADVANCED .7 DNX12 (GAUZE/BANDAGES/DRESSINGS) ×1 IMPLANT
DIFFUSER DRILL AIR PNEUMATIC (MISCELLANEOUS) ×3 IMPLANT
DRAPE LAPAROTOMY 100X72X124 (DRAPES) ×3 IMPLANT
DRAPE MICROSCOPE LEICA (MISCELLANEOUS) ×3 IMPLANT
DRAPE POUCH INSTRU U-SHP 10X18 (DRAPES) IMPLANT
DRAPE SURG 17X23 STRL (DRAPES) ×3 IMPLANT
DRSG OPSITE POSTOP 4X6 (GAUZE/BANDAGES/DRESSINGS) ×3 IMPLANT
DURAPREP 26ML APPLICATOR (WOUND CARE) ×3 IMPLANT
ELECT REM PT RETURN 9FT ADLT (ELECTROSURGICAL) ×3
ELECTRODE REM PT RTRN 9FT ADLT (ELECTROSURGICAL) ×1 IMPLANT
GAUZE 4X4 16PLY RFD (DISPOSABLE) IMPLANT
GLOVE BIO SURGEON STRL SZ7 (GLOVE) IMPLANT
GLOVE BIO SURGEON STRL SZ7.5 (GLOVE) ×3 IMPLANT
GLOVE BIO SURGEON STRL SZ8 (GLOVE) ×3 IMPLANT
GLOVE BIOGEL PI IND STRL 7.0 (GLOVE) ×1 IMPLANT
GLOVE BIOGEL PI INDICATOR 7.0 (GLOVE) ×2
GOWN STRL REUS W/ TWL LRG LVL3 (GOWN DISPOSABLE) ×1 IMPLANT
GOWN STRL REUS W/ TWL XL LVL3 (GOWN DISPOSABLE) ×2 IMPLANT
GOWN STRL REUS W/TWL 2XL LVL3 (GOWN DISPOSABLE) IMPLANT
GOWN STRL REUS W/TWL LRG LVL3 (GOWN DISPOSABLE) ×2
GOWN STRL REUS W/TWL XL LVL3 (GOWN DISPOSABLE) ×4
HEMOSTAT POWDER KIT SURGIFOAM (HEMOSTASIS) IMPLANT
KIT BASIN OR (CUSTOM PROCEDURE TRAY) ×3 IMPLANT
KIT TURNOVER KIT B (KITS) ×3 IMPLANT
NEEDLE HYPO 25X1 1.5 SAFETY (NEEDLE) ×3 IMPLANT
NEEDLE SPNL 20GX3.5 QUINCKE YW (NEEDLE) IMPLANT
NS IRRIG 1000ML POUR BTL (IV SOLUTION) ×3 IMPLANT
OIL CARTRIDGE MAESTRO DRILL (MISCELLANEOUS) ×3
PACK LAMINECTOMY NEURO (CUSTOM PROCEDURE TRAY) ×3 IMPLANT
PAD ARMBOARD 7.5X6 YLW CONV (MISCELLANEOUS) ×9 IMPLANT
RUBBERBAND STERILE (MISCELLANEOUS) ×6 IMPLANT
SPONGE SURGIFOAM ABS GEL SZ50 (HEMOSTASIS) ×3 IMPLANT
STRIP CLOSURE SKIN 1/2X4 (GAUZE/BANDAGES/DRESSINGS) ×2 IMPLANT
SUT VIC AB 0 CT1 18XCR BRD8 (SUTURE) ×1 IMPLANT
SUT VIC AB 0 CT1 8-18 (SUTURE) ×2
SUT VIC AB 2-0 CP2 18 (SUTURE) ×6 IMPLANT
SUT VIC AB 3-0 SH 8-18 (SUTURE) ×6 IMPLANT
TOWEL GREEN STERILE (TOWEL DISPOSABLE) ×3 IMPLANT
TOWEL GREEN STERILE FF (TOWEL DISPOSABLE) ×3 IMPLANT
WATER STERILE IRR 1000ML POUR (IV SOLUTION) ×3 IMPLANT

## 2018-03-06 NOTE — H&P (Signed)
Subjective: Patient is a 57 y.o. female admitted for stenosis lumbar with claudication. Onset of symptoms was several months ago, gradually worsening since that time.  The pain is rated severe, and is located at the across the lower back and radiates to legs. The pain is described as aching and occurs all day. The symptoms have been progressive. Symptoms are exacerbated by exercise. MRI or CT showed stenosis L2-3 L3-4   Past Medical History:  Diagnosis Date  . Anxiety   . Depression   . Hyperlipidemia   . Hypertension   . Thyroid disease     Past Surgical History:  Procedure Laterality Date  . ABDOMINAL HYSTERECTOMY    . NECK SURGERY    . Right hand and wrist surgery Right    Laceration   . SPINE SURGERY      Prior to Admission medications   Medication Sig Start Date End Date Taking? Authorizing Provider  atorvastatin (LIPITOR) 20 MG tablet Take 1 tablet (20 mg total) by mouth daily. Patient taking differently: Take 20 mg by mouth every evening.  12/10/17  Yes Hawks, Christy A, FNP  busPIRone (BUSPAR) 10 MG tablet Take 1 tablet (10 mg total) by mouth 3 (three) times daily. Patient taking differently: Take 10 mg by mouth 3 (three) times daily as needed (for anxiety).  02/11/18  Yes Hawks, Christy A, FNP  cyclobenzaprine (FLEXERIL) 10 MG tablet Take 10 mg by mouth every 8 (eight) hours as needed for muscle spasms.   Yes Eustace Moore, MD  ferrous sulfate 325 (65 FE) MG tablet Take 325 mg by mouth every evening.   Yes [provider]  gabapentin (NEURONTIN) 600 MG tablet Take 600 mg by mouth at bedtime.   Yes [provider]  levothyroxine (SYNTHROID) 88 MCG tablet Take 1 tablet (88 mcg total) by mouth daily. 10/18/17 10/18/18 Yes Hawks, Christy A, FNP  lidocaine (LIDODERM) 5 % Place 1 patch onto the skin daily as needed (pain). Remove & Discard patch within 12 hours or as directed by MD   Yes [provider]  metoprolol tartrate (LOPRESSOR) 50 MG tablet Take 1  tablet (50 mg total) by mouth 2 (two) times daily. 12/10/17  Yes Hawks, Christy A, FNP  Multiple Vitamin (MULTIVITAMIN WITH MINERALS) TABS tablet Take 1 tablet by mouth daily. Women's 50+   Yes [provider]  omeprazole (PRILOSEC) 40 MG capsule Take 1 capsule (40 mg total) by mouth daily. Patient taking differently: Take 40 mg by mouth at bedtime.  12/10/17  Yes Hawks, Christy A, FNP  oxyCODONE (OXY IR/ROXICODONE) 5 MG immediate release tablet Take 5-10 mg by mouth every 8 (eight) hours as needed for severe pain.   Yes [provider]  venlafaxine XR (EFFEXOR XR) 150 MG 24 hr capsule Take 1 capsule (150 mg total) by mouth daily with breakfast. 12/10/17  Yes Hawks, Christy A, FNP  Vitamin D, Ergocalciferol, (DRISDOL) 50000 units CAPS capsule Take 1 capsule (50,000 Units total) by mouth every 7 (seven) days. Patient taking differently: Take 50,000 Units by mouth every Thursday.  10/18/17  Yes Hawks, Christy A, FNP  fenofibrate (TRICOR) 145 MG tablet Take 1 tablet (145 mg total) by mouth daily. 02/28/18   Sharion Balloon, FNP  lubiprostone (AMITIZA) 24 MCG capsule Take 24 mcg by mouth daily at 2 PM.    [provider]  Magnesium 500 MG CAPS Take by mouth.    [provider]  Menthol, Topical Analgesic, (ICY HOT EX) Apply 1  patch topically daily as needed (pain).     [provider]   Allergies  Allergen Reactions  . Chocolate Shortness Of Breath and Rash  . Iodinated Diagnostic Agents Anaphylaxis, Shortness Of Breath and Rash    Can tolerate with Benadryl.   . Methotrexate Derivatives Shortness Of Breath and Rash  . Lisinopril Cough  . Tape Rash and Other (See Comments)    Paper tape only.    Social History   Tobacco Use  . Smoking status: Former Smoker    Last attempt to quit: 07/16/2001    Years since quitting: 16.6  . Smokeless tobacco: Never Used  Substance Use Topics  . Alcohol use: No    Comment: hx alcoholism. none since age 89    Family  History  Problem Relation Age of Onset  . Mental illness Mother   . Alcohol abuse Father   . Diabetes Father   . Heart disease Father      Review of Systems  Positive ROS: neg  All other systems have been reviewed and were otherwise negative with the exception of those mentioned in the HPI and as above.  Objective: Vital signs in last 24 hours:    General Appearance: Alert, cooperative, no distress, appears stated age Head: Normocephalic, without obvious abnormality, atraumatic Eyes: PERRL, conjunctiva/corneas clear, EOM's intact    Neck: Supple, symmetrical, trachea midline Back: Symmetric, no curvature, ROM normal, no CVA tenderness Lungs:  respirations unlabored Heart: Regular rate and rhythm Abdomen: Soft, non-tender Extremities: Extremities normal, atraumatic, no cyanosis or edema Pulses: 2+ and symmetric all extremities Skin: Skin color, texture, turgor normal, no rashes or lesions  NEUROLOGIC:   Mental status: Alert and oriented x4,  no aphasia, good attention span, fund of knowledge, and memory Motor Exam - grossly normal Sensory Exam - grossly normal Reflexes: 1+ Coordination - grossly normal Gait - grossly normal Balance - grossly normal Cranial Nerves: I: smell Not tested  II: visual acuity  OS: nl    OD: nl  II: visual fields Full to confrontation  II: pupils Equal, round, reactive to light  III,VII: ptosis None  III,IV,VI: extraocular muscles  Full ROM  V: mastication Normal  V: facial light touch sensation  Normal  V,VII: corneal reflex  Present  VII: facial muscle function - upper  Normal  VII: facial muscle function - lower Normal  VIII: hearing Not tested  IX: soft palate elevation  Normal  IX,X: gag reflex Present  XI: trapezius strength  5/5  XI: sternocleidomastoid strength 5/5  XI: neck flexion strength  5/5  XII: tongue strength  Normal    Data Review Lab Results  Component Value Date   WBC 7.7 02/27/2018   HGB 14.2 02/27/2018    HCT 43.4 02/27/2018   MCV 89.9 02/27/2018   PLT 361 02/27/2018   Lab Results  Component Value Date   NA 139 02/27/2018   K 4.1 02/27/2018   CL 103 02/27/2018   CO2 27 02/27/2018   BUN 9 02/27/2018   CREATININE 0.70 02/27/2018   GLUCOSE 101 (H) 02/27/2018   Lab Results  Component Value Date   INR 0.92 02/27/2018    Assessment/Plan:  Estimated body mass index is 29.56 kg/m as calculated from the following:   Height as of 02/27/18: 5\' 2"  (1.575 m).   Weight as of 02/27/18: 73.3 kg. Patient admitted for LL for stenosis L2-3 L3-4. Patient has failed a reasonable attempt at conservative therapy.  I explained the condition and procedure  to the patient and answered any questions.  Patient wishes to proceed with procedure as planned. Understands risks/ benefits and typical outcomes of procedure.   JONES,DAVID S 03/06/2018 7:44 AM

## 2018-03-06 NOTE — Anesthesia Preprocedure Evaluation (Addendum)
Anesthesia Evaluation  Patient identified by MRN, date of birth, ID band Patient awake    Reviewed: Allergy & Precautions, NPO status , Patient's Chart, lab work & pertinent test results  Airway Mallampati: II  TM Distance: >3 FB     Dental  (+) Caps, Dental Advisory Given   Pulmonary former smoker,    breath sounds clear to auscultation       Cardiovascular hypertension,  Rhythm:Regular Rate:Normal     Neuro/Psych    GI/Hepatic negative GI ROS, Neg liver ROS,   Endo/Other  Hypothyroidism   Renal/GU negative Renal ROS     Musculoskeletal   Abdominal   Peds  Hematology  (+) anemia ,   Anesthesia Other Findings   Reproductive/Obstetrics                          Anesthesia Physical Anesthesia Plan  ASA: III  Anesthesia Plan: General   Post-op Pain Management:    Induction: Intravenous  PONV Risk Score and Plan: Ondansetron, Dexamethasone and Midazolam  Airway Management Planned: Oral ETT  Additional Equipment:   Intra-op Plan:   Post-operative Plan: Possible Post-op intubation/ventilation  Informed Consent: I have reviewed the patients History and Physical, chart, labs and discussed the procedure including the risks, benefits and alternatives for the proposed anesthesia with the patient or authorized representative who has indicated his/her understanding and acceptance.   Dental advisory given  Plan Discussed with: CRNA and Anesthesiologist  Anesthesia Plan Comments:        Anesthesia Quick Evaluation

## 2018-03-06 NOTE — Anesthesia Postprocedure Evaluation (Signed)
Anesthesia Post Note  Patient: Brittany Lynch  Procedure(s) Performed: Laminectomy and Foraminotomy - Lumbar Two-Three, Three-Four bilateral (Bilateral Back)     Patient location during evaluation: PACU Anesthesia Type: General Level of consciousness: awake Pain management: pain level controlled Vital Signs Assessment: post-procedure vital signs reviewed and stable Respiratory status: spontaneous breathing Cardiovascular status: stable Postop Assessment: no apparent nausea or vomiting Anesthetic complications: no    Last Vitals:  Vitals:   03/06/18 1256 03/06/18 1300  BP:  (!) 142/88  Pulse:  84  Resp: 18 13  Temp: 36.7 C   SpO2:  99%    Last Pain:  Vitals:   03/06/18 1300  TempSrc:   PainSc: 0-No pain                 Winter Trefz

## 2018-03-06 NOTE — Anesthesia Procedure Notes (Signed)
Procedure Name: Intubation Date/Time: 03/06/2018 11:25 AM Performed by: Orlie Dakin, CRNA Pre-anesthesia Checklist: Patient identified, Emergency Drugs available, Suction available and Patient being monitored Patient Re-evaluated:Patient Re-evaluated prior to induction Oxygen Delivery Method: Circle system utilized Preoxygenation: Pre-oxygenation with 100% oxygen Induction Type: IV induction and Rapid sequence Ventilation: Mask ventilation without difficulty Laryngoscope Size: Miller and 2 Grade View: Grade I Tube type: Oral Tube size: 7.0 mm Number of attempts: 1 Airway Equipment and Method: Stylet Placement Confirmation: ETT inserted through vocal cords under direct vision,  positive ETCO2 and breath sounds checked- equal and bilateral Secured at: 23 cm Tube secured with: Tape Dental Injury: Teeth and Oropharynx as per pre-operative assessment  Comments: 4x4s bite block used at end of case, prior to extubation.

## 2018-03-06 NOTE — Op Note (Signed)
03/06/2018  12:49 PM  PATIENT:  Brittany Lynch  57 y.o. female  PRE-OPERATIVE DIAGNOSIS:   lumbar spinal stenosis L2-3 L3-4 with left greater than right leg pain  POST-OPERATIVE DIAGNOSIS:  same  PROCEDURE:  Left L3-4 and L2-3 hemilaminectomy and medial facetectomy and foraminotomy followed by sublaminar decompression  SURGEON:  Sherley Bounds, MD  ASSISTANTS: Glenford Peers FNP  ANESTHESIA:   General  EBL: 50 ml  Total I/O In: 1000 [I.V.:1000] Out: 50 [Blood:50]  BLOOD ADMINISTERED: none  DRAINS: none  SPECIMEN:  none  INDICATION FOR PROCEDURE: This patient presented with left greater than right leg pain. Imaging showed spinal stenosis L23 L3-4. The patient tried conservative measures without relief. Pain was debilitating. Recommended decompressive laminectomy. Patient understood the risks, benefits, and alternatives and potential outcomes and wished to proceed.  PROCEDURE DETAILS: The patient was taken to the operating room and after induction of adequate generalized endotracheal anesthesia, the patient was rolled into the prone position on the Wilson frame and all pressure points were padded. The lumbar region was cleaned and then prepped with DuraPrep and draped in the usual sterile fashion. 5 cc of local anesthesia was injected and then a dorsal midline incision was made and carried down to the lumbo sacral fascia. The fascia was opened and the paraspinous musculature was taken down in a subperiosteal fashion to expose L2-3 and L3-4 on the left. Intraoperative x-ray confirmed my level, and then I used a combination of the high-speed drill and the Kerrison punches to perform a hemilaminectomy, medial facetectomy, and foraminotomy at L2-3 and L3-4 on the left. The underlying yellow ligament was opened and removed in a piecemeal fashion to expose the underlying dura and exiting nerve root. I undercut the lateral recess and dissected down until I was medial to and distal to the  pedicle. The nerve root was well decompressed. Seemed to be significant stenosis at the mid pedicle level and superior lamina level of L3. I spent time undercutting this trying to decompress the central canal. At both levels I drilled up under the spinous process and opposite lamina from the sublaminar decompression by removing the yellow ligament and decompressing the opposite lateral recess. I then palpated with a coronary dilator along the nerve root and into the foramen to assure adequate decompression. I felt no more compression of the nerve root. I irrigated with saline solution containing bacitracin. Achieved hemostasis with bipolar cautery, lined the dura with Gelfoam, and then closed the fascia with 0 Vicryl. I closed the subcutaneous tissues with 2-0 Vicryl and the subcuticular tissues with 3-0 Vicryl. The skin was then closed with benzoin and Steri-Strips. The drapes were removed, a sterile dressing was applied. The patient was awakened from general anesthesia and transferred to the recovery room in stable condition. At the end of the procedure all sponge, needle and instrument counts were correct.    PLAN OF CARE: Admit for overnight observation  PATIENT DISPOSITION:  PACU - hemodynamically stable.   Delay start of Pharmacological VTE agent (>24hrs) due to surgical blood loss or risk of bleeding:  yes

## 2018-03-06 NOTE — Transfer of Care (Signed)
Immediate Anesthesia Transfer of Care Note  Patient: Brittany Lynch  Procedure(s) Performed: Laminectomy and Foraminotomy - Lumbar Two-Three, Three-Four bilateral (Bilateral Back)  Patient Location: PACU  Anesthesia Type:General  Level of Consciousness: awake, alert  and patient cooperative  Airway & Oxygen Therapy: Patient Spontanous Breathing and Patient connected to face mask oxygen  Post-op Assessment: Report given to RN and Post -op Vital signs reviewed and stable  Post vital signs: Reviewed and stable  Last Vitals:  Vitals Value Taken Time  BP    Temp    Pulse 89 03/06/2018 12:58 PM  Resp 8 03/06/2018 12:58 PM  SpO2 100 % 03/06/2018 12:58 PM  Vitals shown include unvalidated device data.  Last Pain:  Vitals:   03/06/18 0930  TempSrc:   PainSc: 10-Worst pain ever      Patients Stated Pain Goal: 4 (21/94/71 2527)  Complications: No apparent anesthesia complications

## 2018-03-07 ENCOUNTER — Encounter (HOSPITAL_COMMUNITY): Payer: Self-pay | Admitting: Neurological Surgery

## 2018-03-07 DIAGNOSIS — M48061 Spinal stenosis, lumbar region without neurogenic claudication: Secondary | ICD-10-CM | POA: Diagnosis not present

## 2018-03-07 MED ORDER — CALCIUM CARBONATE ANTACID 500 MG PO CHEW
1.0000 | CHEWABLE_TABLET | Freq: Three times a day (TID) | ORAL | Status: DC | PRN
  Administered 2018-03-07: 200 mg via ORAL
  Filled 2018-03-07: qty 1

## 2018-03-07 MED ORDER — METHOCARBAMOL 500 MG PO TABS: 500.0000 mg | ORAL_TABLET | Freq: Four times a day (QID) | ORAL | 0 refills | Status: DC

## 2018-03-07 MED ORDER — OXYCODONE HCL 5 MG PO TABS: 5.0000 mg | ORAL_TABLET | ORAL | 0 refills | Status: DC | PRN

## 2018-03-07 NOTE — Progress Notes (Signed)
Patient is discharged from room 3C11 at this time. Alert and in stable condition. IV site d/c'd and instructions read to patient and spouse with understanding verbalized. Left unit via wheelchair with all belongings at side. 

## 2018-03-07 NOTE — Discharge Summary (Signed)
Physician Discharge Summary  Patient ID: Brittany Lynch MRN: [id number] DOB/AGE: 1961-05-11 57 y.o.  Admit date: 03/06/2018 Discharge date: 03/07/2018  Admission Diagnoses: lumbar spinal stenosis L2-3 L3-4 with left greater than right leg pain   Discharge Diagnoses: same   Discharged Condition: good  Hospital Course: The patient was admitted on 03/06/2018 and taken to the operating room where the patient underwent  Left L3-4 and L2-3 hemilaminectomy and medial facetectomy and foraminotomy followed by sublaminar decompression. The patient tolerated the procedure well and was taken to the recovery room and then to the floor in stable condition. The hospital course was routine. There were no complications. The wound remained clean dry and intact. Pt had appropriate back soreness. No complaints of leg pain or new N/T/W. The patient remained afebrile with stable vital signs, and tolerated a regular diet. The patient continued to increase activities, and pain was well controlled with oral pain medications.   Consults: None  Significant Diagnostic Studies:  Results for orders placed or performed during the hospital encounter of 02/27/18  Surgical pcr screen  Result Value Ref Range   MRSA, PCR NEGATIVE NEGATIVE   Staphylococcus aureus POSITIVE (A) NEGATIVE  Basic metabolic panel  Result Value Ref Range   Sodium 139 135 - 145 mmol/L   Potassium 4.1 3.5 - 5.1 mmol/L   Chloride 103 98 - 111 mmol/L   CO2 27 22 - 32 mmol/L   Glucose, Bld 101 (H) 70 - 99 mg/dL   BUN 9 6 - 20 mg/dL   Creatinine, Ser 0.70 0.44 - 1.00 mg/dL   Calcium 9.4 8.9 - 10.3 mg/dL   GFR calc non Af Amer >60 >60 mL/min   GFR calc Af Amer >60 >60 mL/min   Anion gap 9 5 - 15  CBC WITH DIFFERENTIAL  Result Value Ref Range   WBC 7.7 4.0 - 10.5 K/uL   RBC 4.83 3.87 - 5.11 MIL/uL   Hemoglobin 14.2 12.0 - 15.0 g/dL   HCT 43.4 36.0 - 46.0 %   MCV 89.9 78.0 - 100.0 fL   MCH 29.4 26.0 - 34.0 pg   MCHC 32.7 30.0 - 36.0 g/dL    RDW 13.6 11.5 - 15.5 %   Platelets 361 150 - 400 K/uL   Neutrophils Relative % 47 %   Neutro Abs 3.6 1.7 - 7.7 K/uL   Lymphocytes Relative 43 %   Lymphs Abs 3.3 0.7 - 4.0 K/uL   Monocytes Relative 8 %   Monocytes Absolute 0.6 0.1 - 1.0 K/uL   Eosinophils Relative 1 %   Eosinophils Absolute 0.1 0.0 - 0.7 K/uL   Basophils Relative 1 %   Basophils Absolute 0.1 0.0 - 0.1 K/uL   Immature Granulocytes 0 %   Abs Immature Granulocytes 0.0 0.0 - 0.1 K/uL  Protime-INR  Result Value Ref Range   Prothrombin Time 12.2 11.4 - 15.2 seconds   INR 0.92     Chest 2 View  Result Date: 02/27/2018 CLINICAL DATA:  Preop lumbar fusion past smoker history of hypertension EXAM: CHEST - 2 VIEW COMPARISON:  12/31/2007 FINDINGS: Postsurgical changes of the cervical spine. No acute opacity or pleural effusion. Normal heart size. No pneumothorax. IMPRESSION: No active cardiopulmonary disease. Electronically Signed   By: Donavan Foil M.D.   On: 02/27/2018 15:08   Dg Lumbar Spine 1 View  Result Date: 03/06/2018 CLINICAL DATA:  Lumbar surgery. EXAM: LUMBAR SPINE - 1 VIEW COMPARISON:  MRI 10/29/2017. FINDINGS: Lumbar spine numbered as per prior MRI.  Metallic marker tip noted posteriorly at the upper aspect of L3. IMPRESSION: Metallic marker tip noted posteriorly at the upper aspect of L3. Electronically Signed   By: Marcello Moores  Register   On: 03/06/2018 12:09    Antibiotics:  Anti-infectives (From admission, onward)   Start     Dose/Rate Route Frequency Ordered Stop   03/06/18 1900  ceFAZolin (ANCEF) IVPB 2g/100 mL premix     2 g 200 mL/hr over 30 Minutes Intravenous Every 8 hours 03/06/18 1528 03/07/18 0330   03/06/18 1202  bacitracin 50,000 Units in sodium chloride 0.9 % 500 mL irrigation  Status:  Discontinued       As needed 03/06/18 1202 03/06/18 1253   03/06/18 0918  ceFAZolin (ANCEF) 2-4 GM/100ML-% IVPB    Note to Pharmacy:  Ardine Eng   : cabinet override      03/06/18 0918 03/06/18 1138   03/06/18  0915  ceFAZolin (ANCEF) IVPB 2g/100 mL premix     2 g 200 mL/hr over 30 Minutes Intravenous On call to O.R. 03/06/18 0911 03/06/18 1153      Discharge Exam: Blood pressure 125/74, pulse (!) 101, temperature 98.3 F (36.8 C), temperature source Oral, resp. rate 18, height 5\' 2"  (1.575 m), weight 73.3 kg, SpO2 95 %. Neurologic: Grossly normal Ambulating and voiding well  Discharge Medications:   Allergies as of 03/07/2018      Reactions   Chocolate Shortness Of Breath, Rash   Iodinated Diagnostic Agents Anaphylaxis, Shortness Of Breath, Rash   Can tolerate with Benadryl.   Methotrexate Derivatives Shortness Of Breath, Rash   Lisinopril Cough   Tape Rash, Other (See Comments)   Paper tape only.      Medication List    TAKE these medications   atorvastatin 20 MG tablet Commonly known as:  LIPITOR Take 1 tablet (20 mg total) by mouth daily. What changed:  when to take this   busPIRone 10 MG tablet Commonly known as:  BUSPAR Take 1 tablet (10 mg total) by mouth 3 (three) times daily. What changed:    when to take this  reasons to take this   cyclobenzaprine 10 MG tablet Commonly known as:  FLEXERIL Take 10 mg by mouth every 8 (eight) hours as needed for muscle spasms.   fenofibrate 145 MG tablet Commonly known as:  TRICOR Take 1 tablet (145 mg total) by mouth daily.   ferrous sulfate 325 (65 FE) MG tablet Take 325 mg by mouth every evening.   gabapentin 600 MG tablet Commonly known as:  NEURONTIN Take 600 mg by mouth at bedtime.   ICY HOT EX Apply 1 patch topically daily as needed (pain).   levothyroxine 88 MCG tablet Commonly known as:  SYNTHROID, LEVOTHROID Take 1 tablet (88 mcg total) by mouth daily.   lidocaine 5 % Commonly known as:  LIDODERM Place 1 patch onto the skin daily as needed (pain). Remove & Discard patch within 12 hours or as directed by MD   lubiprostone 24 MCG capsule Commonly known as:  AMITIZA Take 24 mcg by mouth daily at 2 PM.    Magnesium 500 MG Caps Take by mouth.   methocarbamol 500 MG tablet Commonly known as:  ROBAXIN Take 1 tablet (500 mg total) by mouth 4 (four) times daily.   metoprolol tartrate 50 MG tablet Commonly known as:  LOPRESSOR Take 1 tablet (50 mg total) by mouth 2 (two) times daily.   multivitamin with minerals Tabs tablet Take 1 tablet by mouth daily.  Women's 50+   omeprazole 40 MG capsule Commonly known as:  PRILOSEC Take 1 capsule (40 mg total) by mouth daily. What changed:  when to take this   oxyCODONE 5 MG immediate release tablet Commonly known as:  Oxy IR/ROXICODONE Take 5-10 mg by mouth every 8 (eight) hours as needed for severe pain. What changed:  Another medication with the same name was added. Make sure you understand how and when to take each.   oxyCODONE 5 MG immediate release tablet Commonly known as:  Oxy IR/ROXICODONE Take 1 tablet (5 mg total) by mouth every 4 (four) hours as needed for severe pain. What changed:  You were already taking a medication with the same name, and this prescription was added. Make sure you understand how and when to take each.   venlafaxine XR 150 MG 24 hr capsule Commonly known as:  EFFEXOR-XR Take 1 capsule (150 mg total) by mouth daily with breakfast.   Vitamin D (Ergocalciferol) 50000 units Caps capsule Commonly known as:  DRISDOL Take 1 capsule (50,000 Units total) by mouth every 7 (seven) days. What changed:  when to take this       Disposition: home   Final Dx: decomopression L2-3, L3-4  Discharge Instructions     Remove dressing in 72 hours   Complete by:  As directed    Call MD for:  difficulty breathing, headache or visual disturbances   Complete by:  As directed    Call MD for:  hives   Complete by:  As directed    Call MD for:  persistant dizziness or light-headedness   Complete by:  As directed    Call MD for:  persistant nausea and vomiting   Complete by:  As directed    Call MD for:  redness, tenderness,  or signs of infection (pain, swelling, redness, odor or green/yellow discharge around incision site)   Complete by:  As directed    Call MD for:  severe uncontrolled pain   Complete by:  As directed    Call MD for:  temperature >100.4   Complete by:  As directed    Diet - low sodium heart healthy   Complete by:  As directed    Driving Restrictions   Complete by:  As directed    No driving 2 weeks   Increase activity slowly   Complete by:  As directed    Lifting restrictions   Complete by:  As directed    Nothing heavier than 8 lbs      Follow-up Information    Eustace Moore, MD. Schedule an appointment as soon as possible for a visit in 2 week(s).   Specialty:  Neurosurgery Contact information: [address] [place] [address] [place] [address] [phone]            Signed: Ocie Cornfield [facility] 03/07/2018, 8:43 AM

## 2018-04-25 ENCOUNTER — Other Ambulatory Visit: Payer: Self-pay | Admitting: Family

## 2018-05-24 ENCOUNTER — Other Ambulatory Visit: Payer: Self-pay | Admitting: Family

## 2018-05-24 DIAGNOSIS — F331 Major depressive disorder, recurrent, moderate: Secondary | ICD-10-CM

## 2018-05-24 DIAGNOSIS — F411 Generalized anxiety disorder: Secondary | ICD-10-CM

## 2018-05-26 NOTE — Telephone Encounter (Signed)
Ov 02/11/18 rtc 6 mos

## 2018-07-07 ENCOUNTER — Telehealth: Payer: Self-pay | Admitting: Family

## 2018-08-14 ENCOUNTER — Ambulatory Visit (INDEPENDENT_AMBULATORY_CARE_PROVIDER_SITE_OTHER): Payer: Medicare HMO | Admitting: Family

## 2018-08-14 ENCOUNTER — Encounter: Payer: Self-pay | Admitting: Family

## 2018-08-14 VITALS — BP 132/79 | HR 68 | Temp 98.1°F | Ht 62.0 in | Wt 161.6 lb

## 2018-08-14 DIAGNOSIS — F331 Major depressive disorder, recurrent, moderate: Secondary | ICD-10-CM | POA: Diagnosis not present

## 2018-08-14 DIAGNOSIS — D509 Iron deficiency anemia, unspecified: Secondary | ICD-10-CM

## 2018-08-14 DIAGNOSIS — I10 Essential (primary) hypertension: Secondary | ICD-10-CM | POA: Diagnosis not present

## 2018-08-14 DIAGNOSIS — M545 Low back pain, unspecified: Secondary | ICD-10-CM

## 2018-08-14 DIAGNOSIS — K219 Gastro-esophageal reflux disease without esophagitis: Secondary | ICD-10-CM

## 2018-08-14 DIAGNOSIS — Z23 Encounter for immunization: Secondary | ICD-10-CM

## 2018-08-14 DIAGNOSIS — E559 Vitamin D deficiency, unspecified: Secondary | ICD-10-CM

## 2018-08-14 DIAGNOSIS — E039 Hypothyroidism, unspecified: Secondary | ICD-10-CM | POA: Diagnosis not present

## 2018-08-14 DIAGNOSIS — E785 Hyperlipidemia, unspecified: Secondary | ICD-10-CM | POA: Diagnosis not present

## 2018-08-14 DIAGNOSIS — F411 Generalized anxiety disorder: Secondary | ICD-10-CM

## 2018-08-14 DIAGNOSIS — G8929 Other chronic pain: Secondary | ICD-10-CM

## 2018-08-14 NOTE — Patient Instructions (Signed)

## 2018-08-14 NOTE — Progress Notes (Signed)
Subjective:    Patient ID: Brittany Lynch, female    DOB: June 29, 1961, 58 y.o.   MRN: [id number]  Chief Complaint  Patient presents with  . anxiety recheck    six month   PT presents to the office today for chronic follow up. Pt is followed by Pain Clinic every 2  months for chronic back pain and injections  Hypertension  This is a chronic problem. The current episode started more than 1 year ago. The problem has been resolved since onset. The problem is controlled. Associated symptoms include anxiety, malaise/fatigue and peripheral edema ("a little"). Pertinent negatives include no shortness of breath. Risk factors for coronary artery disease include dyslipidemia and sedentary lifestyle. The current treatment provides moderate improvement. Identifiable causes of hypertension include a thyroid problem.  Thyroid Problem  Presents for follow-up visit. Symptoms include anxiety, constipation, depressed mood, fatigue and hair loss. Patient reports no diarrhea. The symptoms have been worsening.  Anemia  Presents for follow-up visit. Symptoms include malaise/fatigue. There has been no bruising/bleeding easily.  Anxiety  Presents for follow-up visit. Symptoms include decreased concentration, depressed mood, excessive worry, insomnia, irritability and nervous/anxious behavior. Patient reports no shortness of breath. Symptoms occur most days. The quality of sleep is good.   Her past medical history is significant for anemia.  Depression         This is a chronic problem.  The current episode started more than 1 year ago.   The onset quality is gradual.   The problem occurs intermittently.  Associated symptoms include decreased concentration, fatigue and insomnia.  Past medical history includes thyroid problem and anxiety.   Back Pain  This is a chronic problem. The current episode started more than 1 year ago. The problem occurs intermittently. The problem has been waxing and waning since onset. The  pain is at a severity of 8/10. The pain is moderate. The symptoms are aggravated by standing and bending.  Gastroesophageal Reflux  She complains of heartburn. She reports no belching or no coughing. This is a chronic problem. The current episode started more than 1 year ago. The problem occurs occasionally. The problem has been waxing and waning. Associated symptoms include fatigue. She has tried a PPI for the symptoms. The treatment provided moderate relief.      Review of Systems  Constitutional: Positive for fatigue, irritability and malaise/fatigue.  Respiratory: Negative for cough and shortness of breath.   Gastrointestinal: Positive for constipation and heartburn. Negative for diarrhea.  Musculoskeletal: Positive for back pain.  Hematological: Does not bruise/bleed easily.  Psychiatric/Behavioral: Positive for decreased concentration and depression. The patient is nervous/anxious and has insomnia.   All other systems reviewed and are negative.      Objective:   Physical Exam Vitals signs reviewed.  Constitutional:      General: She is not in acute distress.    Appearance: She is well-developed.  HENT:     Head: Normocephalic and atraumatic.     Right Ear: External ear normal.  Eyes:     Pupils: Pupils are equal, round, and reactive to light.  Neck:     Musculoskeletal: Normal range of motion and neck supple.     Thyroid: No thyromegaly.  Cardiovascular:     Rate and Rhythm: Normal rate and regular rhythm.     Heart sounds: Normal heart sounds. No murmur.  Pulmonary:     Effort: Pulmonary effort is normal. No respiratory distress.     Breath sounds: Normal breath sounds.  No wheezing.  Abdominal:     General: Bowel sounds are normal. There is no distension.     Palpations: Abdomen is soft.     Tenderness: There is no abdominal tenderness.  Musculoskeletal: Normal range of motion.        General: No tenderness.  Skin:    General: Skin is warm and dry.  Neurological:       Mental Status: She is alert and oriented to person, place, and time.     Cranial Nerves: No cranial nerve deficit.     Deep Tendon Reflexes: Reflexes are normal and symmetric.  Psychiatric:        Mood and Affect: Affect is flat.        Behavior: Behavior normal.        Thought Content: Thought content normal.        Judgment: Judgment normal.       BP 132/79   Pulse 68   Temp 98.1 F (36.7 C) (Oral)   Ht '5\' 2"'$  (1.575 m)   Wt 161 lb 9.6 oz (73.3 kg)   BMI 29.56 kg/m      Assessment & Plan:  Brittany Lynch comes in today with chief complaint of anxiety recheck (six month)   Diagnosis and orders addressed:  1. Essential hypertension - CMP14+EGFR  2. Hypothyroidism, unspecified type - CMP14+EGFR - TSH  3. Hyperlipidemia, unspecified hyperlipidemia type - CMP14+EGFR - Lipid panel  4. Moderate episode of recurrent major depressive disorder ([place]) - CMP14+EGFR - Ambulatory referral to Psychiatry  5. GAD (generalized anxiety disorder) - CMP14+EGFR - Ambulatory referral to Psychiatry  6. Iron deficiency anemia, unspecified iron deficiency anemia type - Anemia Profile B - CMP14+EGFR  7. Chronic low back pain, unspecified back pain laterality, unspecified whether sciatica present - CMP14+EGFR  8. Vitamin D deficiency - CMP14+EGFR - VITAMIN D 25 Hydroxy (Vit-D Deficiency, Fractures)  9. Gastroesophageal reflux disease, esophagitis presence not specified - CMP14+EGFR   Labs pending Health Maintenance reviewed Diet and exercise encouraged  Follow up plan: 6 months    [name], FNP

## 2018-08-15 ENCOUNTER — Other Ambulatory Visit: Payer: Self-pay | Admitting: *Deleted

## 2018-08-15 ENCOUNTER — Telehealth: Payer: Self-pay | Admitting: Family

## 2018-08-15 DIAGNOSIS — R7309 Other abnormal glucose: Secondary | ICD-10-CM

## 2018-08-15 LAB — ANEMIA PROFILE B
BASOS ABS: 0.1 10*3/uL (ref 0.0–0.2)
Basos: 1 %
EOS (ABSOLUTE): 0.2 10*3/uL (ref 0.0–0.4)
Eos: 2 %
FERRITIN: 64 ng/mL (ref 15–150)
Folate: >20 ng/mL (ref >3.0)
Hematocrit: 40.3 % (ref 34.0–46.6)
Hemoglobin: 13.5 g/dL (ref 11.1–15.9)
IRON: 82 ug/dL (ref 27–159)
Immature Grans (Abs): 0 10*3/uL (ref 0.0–0.1)
Immature Granulocytes: 0 %
Iron Saturation: 20 % (ref 15–55)
LYMPHS ABS: 3.5 10*3/uL — AB (ref 0.7–3.1)
Lymphs: 45 %
MCH: 28.8 pg (ref 26.6–33.0)
MCHC: 33.5 g/dL (ref 31.5–35.7)
MCV: 86 fL (ref 79–97)
MONOS ABS: 0.5 10*3/uL (ref 0.1–0.9)
Monocytes: 7 %
NEUTROS ABS: 3.5 10*3/uL (ref 1.4–7.0)
Neutrophils: 45 %
PLATELETS: 458 10*3/uL — AB (ref 150–450)
RBC: 4.68 x10E6/uL (ref 3.77–5.28)
RDW: 12.7 % (ref 11.7–15.4)
Retic Ct Pct: 1.3 % (ref 0.6–2.6)
Total Iron Binding Capacity: 412 ug/dL (ref 250–450)
UIBC: 330 ug/dL (ref 131–425)
VITAMIN B 12: 777 pg/mL (ref 232–1245)
WBC: 7.8 10*3/uL (ref 3.4–10.8)

## 2018-08-15 LAB — CMP14+EGFR
A/G RATIO: 2.2 (ref 1.2–2.2)
ALT: 35 IU/L — AB (ref 0–32)
AST: 28 IU/L (ref 0–40)
Albumin: 4.7 g/dL (ref 3.8–4.9)
Alkaline Phosphatase: 63 IU/L (ref 39–117)
BILIRUBIN TOTAL: 0.3 mg/dL (ref 0.0–1.2)
BUN/Creatinine Ratio: 11 (ref 9–23)
BUN: 10 mg/dL (ref 6–24)
CHLORIDE: 96 mmol/L (ref 96–106)
CO2: 24 mmol/L (ref 20–29)
Calcium: 9.5 mg/dL (ref 8.7–10.2)
Creatinine, Ser: 0.93 mg/dL (ref 0.57–1.00)
GFR calc Af Amer: 78 mL/min/{1.73_m2} (ref >59)
GFR calc non Af Amer: 68 mL/min/{1.73_m2} (ref >59)
GLUCOSE: 124 mg/dL — AB (ref 65–99)
Globulin, Total: 2.1 g/dL (ref 1.5–4.5)
POTASSIUM: 4.4 mmol/L (ref 3.5–5.2)
Sodium: 137 mmol/L (ref 134–144)
TOTAL PROTEIN: 6.8 g/dL (ref 6.0–8.5)

## 2018-08-15 LAB — TSH: TSH: 2.12 u[IU]/mL (ref 0.450–4.500)

## 2018-08-15 LAB — LIPID PANEL
Chol/HDL Ratio: 3 ratio (ref 0.0–4.4)
Cholesterol, Total: 188 mg/dL (ref 100–199)
HDL: 63 mg/dL (ref >39)
LDL Calculated: 95 mg/dL (ref 0–99)
TRIGLYCERIDES: 151 mg/dL — AB (ref 0–149)
VLDL CHOLESTEROL CAL: 30 mg/dL (ref 5–40)

## 2018-08-15 LAB — VITAMIN D 25 HYDROXY (VIT D DEFICIENCY, FRACTURES): Vit D, 25-Hydroxy: 50.5 ng/mL (ref 30.0–100.0)

## 2018-08-15 NOTE — Telephone Encounter (Signed)
Aware of lab results  

## 2018-08-19 ENCOUNTER — Other Ambulatory Visit: Payer: Self-pay | Admitting: Family

## 2018-08-19 DIAGNOSIS — F331 Major depressive disorder, recurrent, moderate: Secondary | ICD-10-CM

## 2018-08-19 DIAGNOSIS — F411 Generalized anxiety disorder: Secondary | ICD-10-CM

## 2018-08-20 LAB — SPECIMEN STATUS REPORT

## 2018-08-20 LAB — HGB A1C W/O EAG: Hgb A1c MFr Bld: 5.3 % (ref 4.8–5.6)

## 2018-08-23 ENCOUNTER — Other Ambulatory Visit: Payer: Self-pay | Admitting: Family

## 2018-08-25 ENCOUNTER — Encounter: Payer: Self-pay | Admitting: Family

## 2018-08-25 ENCOUNTER — Ambulatory Visit (INDEPENDENT_AMBULATORY_CARE_PROVIDER_SITE_OTHER): Payer: Medicare HMO | Admitting: Family

## 2018-08-25 VITALS — BP 119/72 | HR 77 | Temp 97.9°F | Ht 62.0 in | Wt 161.8 lb

## 2018-08-25 DIAGNOSIS — Z0000 Encounter for general adult medical examination without abnormal findings: Secondary | ICD-10-CM | POA: Diagnosis not present

## 2018-08-25 DIAGNOSIS — R7309 Other abnormal glucose: Secondary | ICD-10-CM

## 2018-08-25 LAB — BAYER DCA HB A1C WAIVED: HB A1C: 5.3 % (ref <7.0)

## 2018-08-25 MED ORDER — OMEPRAZOLE 40 MG PO CPDR: 40.0000 mg | DELAYED_RELEASE_CAPSULE | Freq: Every day | ORAL | 4 refills | Status: DC

## 2018-08-25 MED ORDER — LEVOTHYROXINE SODIUM 88 MCG PO TABS: 88.0000 ug | ORAL_TABLET | Freq: Every day | ORAL | 3 refills | Status: DC

## 2018-08-25 NOTE — Addendum Note (Signed)
Addended by: Wyline Mood on: 08/25/2018 04:15 PM   Modules accepted: Orders

## 2018-08-25 NOTE — Patient Instructions (Signed)

## 2018-08-25 NOTE — Progress Notes (Signed)
Subjective:    Brittany Lynch is a 58 y.o. female who presents for a Welcome to Medicare exam.   Review of Systems Got new glasses and feels like her eyes are hard to focus.  Cardiac Risk Factors include: advanced age (>55men, >18 women);dyslipidemia;hypertension      Objective:    Today's Vitals   08/25/18 1437  BP: 119/72  Pulse: 77  Temp: 97.9 F (36.6 C)  TempSrc: Oral  Weight: 161 lb 12.8 oz (73.4 kg)  Height: 5\' 2"  (1.575 m)  Body mass index is 29.59 kg/m.  Medications Outpatient Encounter Medications as of 08/25/2018  Medication Sig  . atorvastatin (LIPITOR) 20 MG tablet TAKE 1 TABLET BY MOUTH ONCE DAILY  . busPIRone (BUSPAR) 10 MG tablet Take 1 tablet (10 mg total) by mouth 3 (three) times daily. (Patient taking differently: Take 10 mg by mouth 3 (three) times daily as needed (for anxiety). )  . fenofibrate (TRICOR) 145 MG tablet TAKE 1 TABLET BY MOUTH ONCE DAILY  . ferrous sulfate 325 (65 FE) MG tablet Take 325 mg by mouth every evening.  . gabapentin (NEURONTIN) 600 MG tablet Take 600 mg by mouth at bedtime.  Marland Kitchen levothyroxine (SYNTHROID) 88 MCG tablet Take 1 tablet (88 mcg total) by mouth daily.  . Menthol, Topical Analgesic, (ICY HOT EX) Apply 1 patch topically daily as needed (pain).   . methocarbamol (ROBAXIN) 500 MG tablet Take 1 tablet (500 mg total) by mouth 4 (four) times daily.  . metoprolol tartrate (LOPRESSOR) 50 MG tablet TAKE 1 TABLET BY MOUTH TWICE DAILY  . Multiple Vitamin (MULTIVITAMIN WITH MINERALS) TABS tablet Take 1 tablet by mouth daily. Women's 50+  . omeprazole (PRILOSEC) 40 MG capsule TAKE 1 CAPSULE BY MOUTH ONCE DAILY  . oxyCODONE (OXY IR/ROXICODONE) 5 MG immediate release tablet Take 5-10 mg by mouth every 8 (eight) hours as needed for severe pain.  Marland Kitchen oxyCODONE (OXY IR/ROXICODONE) 5 MG immediate release tablet Take 1 tablet (5 mg total) by mouth every 4 (four) hours as needed for severe pain.  Marland Kitchen venlafaxine XR (EFFEXOR-XR) 150 MG 24 hr  capsule TAKE 1 CAPSULE BY MOUTH ONCE DAILY WITH BREAKFAST  . Vitamin D, Ergocalciferol, (DRISDOL) 50000 units CAPS capsule Take 1 capsule (50,000 Units total) by mouth every 7 (seven) days. (Patient taking differently: Take 50,000 Units by mouth every Thursday. )   No facility-administered encounter medications on file as of 08/25/2018.      History: Past Medical History:  Diagnosis Date  . Anxiety   . Depression   . Hyperlipidemia   . Hypertension   . Thyroid disease    Past Surgical History:  Procedure Laterality Date  . ABDOMINAL HYSTERECTOMY    . LUMBAR LAMINECTOMY/DECOMPRESSION MICRODISCECTOMY Bilateral 03/06/2018   Procedure: Laminectomy and Foraminotomy - Lumbar Two-Three, Three-Four bilateral;  Surgeon: Eustace Moore, MD;  Location: [facility];  Service: Neurosurgery;  Laterality: Bilateral;  Laminectomy and Foraminotomy - Lumbar Two-Three, Three-Four bilateral  . NECK SURGERY    . Right hand and wrist surgery Right    Laceration   . SPINE SURGERY      Family History  Problem Relation Age of Onset  . Mental illness Mother   . Alcohol abuse Father   . Diabetes Father   . Heart disease Father    Social History   Occupational History  . Not on file  Tobacco Use  . Smoking status: Former Smoker    Last attempt to quit: 07/16/2001  Years since quitting: 17.1  . Smokeless tobacco: Never Used  Substance and Sexual Activity  . Alcohol use: No    Comment: hx alcoholism. none since age 2  . Drug use: No    Comment: none since age 24  . Sexual activity: Not on file    Tobacco Counseling Counseling given: Not Answered   Immunizations and Health Maintenance Immunization History  Administered Date(s) Administered  . H1N1 06/01/2008  . Influenza Split 04/26/2013, 04/19/2015  . Influenza,inj,Quad PF,6+ Mos 05/06/2017, 08/14/2018  . Influenza-Unspecified 07/17/2011  . Tdap 09/02/2012   Health Maintenance Due  Topic Date Due  . MAMMOGRAM  07/25/2018    Activities  of Daily Living In your present state of health, do you have any difficulty performing the following activities: 08/25/2018 03/06/2018  Hearing? N N  Vision? N N  Comment Wears glasses. -  Difficulty concentrating or making decisions? Y N  Walking or climbing stairs? Y N  Comment After walking ten minutes, back hurts. -  Dressing or bathing? N Y  Doing errands, shopping? N N  Preparing Food and eating ? N -  Using the Toilet? N -  In the past six months, have you accidently leaked urine? Y -  Do you have problems with loss of bowel control? N -  Managing your Medications? N -  Managing your Finances? N -  Housekeeping or managing your Housekeeping? N -  Some recent data might be hidden    Physical Exam  WNL, or other factors deemed appropriate based on the beneficiary's medical and social history and current clinical standards.  Advanced Directives: Does Patient Have a Medical Advance Directive?: No Would patient like information on creating a medical advance directive?: Yes (ED - Information included in AVS)    Assessment:    This is a routine wellness examination for this patient .   Vision/Hearing screen No exam data present  Dietary issues and exercise activities discussed:  Exercise limited by: Other - see comments;None identified(Not movtivated.)  Goals   None    Depression Screen PHQ 2/9 Scores 08/25/2018 08/14/2018 02/11/2018 12/10/2017  PHQ - 2 Score 6 3 0 4  PHQ- 9 Score 13 15 - 13     Fall Risk Fall Risk  08/25/2018  Falls in the past year? 0  Number falls in past yr: -  Injury with Fall? -    Cognitive Function: MMSE - Mini Mental State Exam 08/25/2018  Orientation to time 5  Orientation to Place 5  Registration 3  Attention/ Calculation 5  Recall 2  Language- name 2 objects 2  Language- repeat 1  Language- follow 3 step command 3  Language- read & follow direction 1  Write a sentence 1  Copy design 1  Total score 29        Patient Care  Team: Sharion Balloon, FNP as PCP - General (Family Medicine) Gala Romney, Cristopher Estimable, MD as Consulting Physician (Gastroenterology)     Plan:     I have personally reviewed and noted the following in the patient's chart:   . Medical and social history . Use of alcohol, tobacco or illicit drugs  . Current medications and supplements . Functional ability and status . Nutritional status . Physical activity . Advanced directives . List of other physicians . Hospitalizations, surgeries, and ER visits in previous 12 months . Vitals . Screenings to include cognitive, depression, and falls . Referrals and appointments  In addition, I have reviewed and discussed with patient  certain preventive protocols, quality metrics, and best practice recommendations. A written personalized care plan for preventive services as well as general preventive health recommendations were provided to patient.     [name], [place] 08/25/2018

## 2018-08-26 ENCOUNTER — Other Ambulatory Visit: Payer: Self-pay | Admitting: Family

## 2018-08-27 ENCOUNTER — Telehealth: Payer: Self-pay | Admitting: Family

## 2018-09-01 ENCOUNTER — Other Ambulatory Visit: Payer: Self-pay | Admitting: Family

## 2018-09-01 NOTE — Progress Notes (Signed)
Aware of results and recommendations  

## 2018-09-01 NOTE — Progress Notes (Signed)
A1C at goal- Pt is not diabetic. Low carb diet and exercise.

## 2018-09-01 NOTE — Telephone Encounter (Signed)
A1C at goal, not diabetic.

## 2018-09-09 LAB — HM MAMMOGRAPHY

## 2018-09-19 ENCOUNTER — Ambulatory Visit (INDEPENDENT_AMBULATORY_CARE_PROVIDER_SITE_OTHER): Payer: Medicare Other | Admitting: Psychiatry

## 2018-09-19 ENCOUNTER — Other Ambulatory Visit: Payer: Self-pay

## 2018-09-19 ENCOUNTER — Encounter (HOSPITAL_COMMUNITY): Payer: Self-pay | Admitting: Psychiatry

## 2018-09-19 VITALS — BP 110/80 | HR 82 | Ht 62.0 in | Wt 159.0 lb

## 2018-09-19 DIAGNOSIS — F331 Major depressive disorder, recurrent, moderate: Secondary | ICD-10-CM | POA: Diagnosis not present

## 2018-09-19 DIAGNOSIS — F411 Generalized anxiety disorder: Secondary | ICD-10-CM

## 2018-09-19 DIAGNOSIS — F0630 Mood disorder due to known physiological condition, unspecified: Secondary | ICD-10-CM | POA: Diagnosis not present

## 2018-09-19 MED ORDER — BUPROPION HCL ER (SR) 100 MG PO TB12: 100.0000 mg | ORAL_TABLET | Freq: Every day | ORAL | 1 refills | Status: DC
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
## 2018-10-16 ENCOUNTER — Ambulatory Visit (INDEPENDENT_AMBULATORY_CARE_PROVIDER_SITE_OTHER): Payer: Medicare HMO | Admitting: Psychiatry

## 2018-10-16 ENCOUNTER — Encounter (HOSPITAL_COMMUNITY): Payer: Self-pay | Admitting: Psychiatry

## 2018-10-16 ENCOUNTER — Other Ambulatory Visit: Payer: Self-pay

## 2018-10-16 DIAGNOSIS — Z79899 Other long term (current) drug therapy: Secondary | ICD-10-CM | POA: Diagnosis not present

## 2018-10-16 DIAGNOSIS — R69 Illness, unspecified: Secondary | ICD-10-CM | POA: Diagnosis not present

## 2018-10-16 DIAGNOSIS — F39 Unspecified mood [affective] disorder: Secondary | ICD-10-CM

## 2018-10-16 DIAGNOSIS — F0630 Mood disorder due to known physiological condition, unspecified: Secondary | ICD-10-CM

## 2018-10-16 DIAGNOSIS — F331 Major depressive disorder, recurrent, moderate: Secondary | ICD-10-CM | POA: Diagnosis not present

## 2018-10-16 DIAGNOSIS — F411 Generalized anxiety disorder: Secondary | ICD-10-CM | POA: Diagnosis not present

## 2018-10-16 MED ORDER — BUPROPION HCL ER (SR) 150 MG PO TB12: 150.0000 mg | ORAL_TABLET | Freq: Every day | ORAL | 1 refills | Status: DC
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
## 2018-10-20 ENCOUNTER — Other Ambulatory Visit: Payer: Self-pay | Admitting: Family

## 2018-11-15 ENCOUNTER — Other Ambulatory Visit: Payer: Self-pay | Admitting: Family

## 2018-11-15 DIAGNOSIS — F331 Major depressive disorder, recurrent, moderate: Secondary | ICD-10-CM

## 2018-11-15 DIAGNOSIS — F411 Generalized anxiety disorder: Secondary | ICD-10-CM

## 2018-11-17 ENCOUNTER — Telehealth: Payer: Self-pay | Admitting: Family

## 2018-11-17 NOTE — Telephone Encounter (Signed)
Does patient need to continue Vit D 5000 units

## 2018-11-17 NOTE — Telephone Encounter (Signed)
What is the name of the medication? Vitamin D, Ergocalciferol, (DRISDOL) 1.25 MG (50000 UT) CAPS capsule  Have you contacted your pharmacy to request a refill? No  Which pharmacy would you like this sent to? [facility]   Patient notified that their request is being sent to the clinical staff for review and that they should receive a call once it is complete. If they do not receive a call within 24 hours they can check with their pharmacy or our office.

## 2018-11-18 ENCOUNTER — Telehealth: Payer: Self-pay | Admitting: Family

## 2018-11-18 DIAGNOSIS — M5417 Radiculopathy, lumbosacral region: Secondary | ICD-10-CM | POA: Diagnosis not present

## 2018-11-18 MED ORDER — OMEPRAZOLE 40 MG PO CPDR: 40.0000 mg | DELAYED_RELEASE_CAPSULE | Freq: Every day | ORAL | 3 refills | Status: DC

## 2018-11-18 NOTE — Telephone Encounter (Signed)
LM for pt to start OTC vit D.

## 2018-11-18 NOTE — Telephone Encounter (Signed)
rx changed to name brand

## 2018-11-18 NOTE — Telephone Encounter (Signed)
Vit D levels normal, She can OTC Vit D daily.

## 2018-12-16 ENCOUNTER — Ambulatory Visit (INDEPENDENT_AMBULATORY_CARE_PROVIDER_SITE_OTHER): Payer: Medicare HMO | Admitting: Family

## 2018-12-16 ENCOUNTER — Encounter: Payer: Self-pay | Admitting: Family

## 2018-12-16 ENCOUNTER — Other Ambulatory Visit: Payer: Self-pay

## 2018-12-16 ENCOUNTER — Encounter (HOSPITAL_COMMUNITY): Payer: Self-pay | Admitting: Psychiatry

## 2018-12-16 ENCOUNTER — Ambulatory Visit (INDEPENDENT_AMBULATORY_CARE_PROVIDER_SITE_OTHER): Payer: Medicare HMO | Admitting: Psychiatry

## 2018-12-16 DIAGNOSIS — E669 Obesity, unspecified: Secondary | ICD-10-CM | POA: Diagnosis not present

## 2018-12-16 DIAGNOSIS — F331 Major depressive disorder, recurrent, moderate: Secondary | ICD-10-CM

## 2018-12-16 DIAGNOSIS — M2550 Pain in unspecified joint: Secondary | ICD-10-CM | POA: Diagnosis not present

## 2018-12-16 DIAGNOSIS — R399 Unspecified symptoms and signs involving the genitourinary system: Secondary | ICD-10-CM

## 2018-12-16 DIAGNOSIS — F419 Anxiety disorder, unspecified: Secondary | ICD-10-CM | POA: Diagnosis not present

## 2018-12-16 DIAGNOSIS — F0630 Mood disorder due to known physiological condition, unspecified: Secondary | ICD-10-CM | POA: Diagnosis not present

## 2018-12-16 DIAGNOSIS — E785 Hyperlipidemia, unspecified: Secondary | ICD-10-CM | POA: Diagnosis not present

## 2018-12-16 DIAGNOSIS — F411 Generalized anxiety disorder: Secondary | ICD-10-CM | POA: Diagnosis not present

## 2018-12-16 DIAGNOSIS — K219 Gastro-esophageal reflux disease without esophagitis: Secondary | ICD-10-CM | POA: Diagnosis not present

## 2018-12-16 DIAGNOSIS — E039 Hypothyroidism, unspecified: Secondary | ICD-10-CM | POA: Diagnosis not present

## 2018-12-16 DIAGNOSIS — I10 Essential (primary) hypertension: Secondary | ICD-10-CM | POA: Diagnosis not present

## 2018-12-16 DIAGNOSIS — R69 Illness, unspecified: Secondary | ICD-10-CM | POA: Diagnosis not present

## 2018-12-16 DIAGNOSIS — Z79891 Long term (current) use of opiate analgesic: Secondary | ICD-10-CM | POA: Diagnosis not present

## 2018-12-16 DIAGNOSIS — G8929 Other chronic pain: Secondary | ICD-10-CM | POA: Diagnosis not present

## 2018-12-16 MED ORDER — BUPROPION HCL ER (SR) 150 MG PO TB12: 150.0000 mg | ORAL_TABLET | Freq: Every day | ORAL | 2 refills | Status: DC

## 2018-12-16 MED ORDER — PHENAZOPYRIDINE HCL 100 MG PO TABS: 100.0000 mg | ORAL_TABLET | Freq: Three times a day (TID) | ORAL | 0 refills | Status: DC | PRN

## 2018-12-16 MED ORDER — CEPHALEXIN 500 MG PO CAPS: 500.0000 mg | ORAL_CAPSULE | Freq: Two times a day (BID) | ORAL | 0 refills | Status: DC

## 2018-12-16 NOTE — Progress Notes (Signed)
   Virtual Visit via telephone Note  I connected with Brittany Lynch on 12/16/18 at 8:40 AM by telephone and verified that I am speaking with the correct person using two identifiers. Brittany Lynch is currently located at home and no one is currently with her during visit. The provider, [name], FNP is located in their office at time of visit.  I discussed the limitations, risks, security and privacy concerns of performing an evaluation and management service by telephone and the availability of in person appointments. I also discussed with the patient that there may be a patient responsible charge related to this service. The patient expressed understanding and agreed to proceed.   History and Present Illness:  Dysuria   This is a new problem. The current episode started yesterday. The problem occurs every urination. The problem has been gradually worsening. The quality of the pain is described as burning. The pain is at a severity of 5/10. There has been no fever. Associated symptoms include frequency, hematuria, hesitancy and urgency. Pertinent negatives include no nausea or vomiting. She has tried acetaminophen and increased fluids for the symptoms. The treatment provided mild relief.      Review of Systems  Gastrointestinal: Negative for nausea and vomiting.  Genitourinary: Positive for dysuria, frequency, hematuria, hesitancy and urgency.  All other systems reviewed and are negative.    Observations/Objective: No SOB or distress noted  Assessment and Plan: 1. UTI symptoms Force fluids AZO over the counter X2 days RTO prn - cephALEXin (KEFLEX) 500 MG capsule; Take 1 capsule (500 mg total) by mouth 2 (two) times daily.  Dispense: 14 capsule; Refill: 0 - phenazopyridine (PYRIDIUM) 100 MG tablet; Take 1 tablet (100 mg total) by mouth 3 (three) times daily as needed for pain.  Dispense: 10 tablet; Refill: 0     I discussed the assessment and treatment plan with the  patient. The patient was provided an opportunity to ask questions and all were answered. The patient agreed with the plan and demonstrated an understanding of the instructions.   The patient was advised to call back or seek an in-person evaluation if the symptoms worsen or if the condition fails to improve as anticipated.  The above assessment and management plan was discussed with the patient. The patient verbalized understanding of and has agreed to the management plan. Patient is aware to call the clinic if symptoms persist or worsen. Patient is aware when to return to the clinic for a follow-up visit. Patient educated on when it is appropriate to go to the emergency department.   Time call ended:  8:50 AM  I provided 10 minutes of non-face-to-face time during this encounter.    [name], FNP
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
## 2018-12-19 DIAGNOSIS — M48061 Spinal stenosis, lumbar region without neurogenic claudication: Secondary | ICD-10-CM | POA: Diagnosis not present

## 2019-01-16 ENCOUNTER — Other Ambulatory Visit: Payer: Self-pay | Admitting: Family

## 2019-01-16 DIAGNOSIS — F411 Generalized anxiety disorder: Secondary | ICD-10-CM

## 2019-01-16 NOTE — Telephone Encounter (Signed)
Please advise last seen 08/2018

## 2019-01-22 ENCOUNTER — Other Ambulatory Visit: Payer: Self-pay | Admitting: Family

## 2019-01-22 DIAGNOSIS — F411 Generalized anxiety disorder: Secondary | ICD-10-CM

## 2019-01-22 DIAGNOSIS — F331 Major depressive disorder, recurrent, moderate: Secondary | ICD-10-CM

## 2019-02-10 DIAGNOSIS — M5417 Radiculopathy, lumbosacral region: Secondary | ICD-10-CM | POA: Diagnosis not present

## 2019-02-11 ENCOUNTER — Other Ambulatory Visit: Payer: Self-pay

## 2019-02-12 ENCOUNTER — Other Ambulatory Visit: Payer: Self-pay | Admitting: Family

## 2019-02-12 ENCOUNTER — Ambulatory Visit (INDEPENDENT_AMBULATORY_CARE_PROVIDER_SITE_OTHER): Payer: Medicare HMO | Admitting: Family

## 2019-02-12 ENCOUNTER — Encounter: Payer: Self-pay | Admitting: Family

## 2019-02-12 DIAGNOSIS — R21 Rash and other nonspecific skin eruption: Secondary | ICD-10-CM | POA: Diagnosis not present

## 2019-02-12 NOTE — Progress Notes (Signed)
   Virtual Visit via telephone Note Due to COVID-19 pandemic this visit was conducted virtually. This visit type was conducted due to national recommendations for restrictions regarding the COVID-19 Pandemic (e.g. social distancing, sheltering in place) in an effort to limit this patient's exposure and mitigate transmission in our community. All issues noted in this document were discussed and addressed.  A physical exam was not performed with this format.  I connected with Brittany Lynch on 02/12/19 at 2:58 pm by telephone and verified that I am speaking with the correct person using two identifiers. Brittany Lynch is currently located at home and no one  is currently with her during visit. The provider, [name], FNP is located in their office at time of visit.  I discussed the limitations, risks, security and privacy concerns of performing an evaluation and management service by telephone and the availability of in person appointments. I also discussed with the patient that there may be a patient responsible charge related to this service. The patient expressed understanding and agreed to proceed.   History and Present Illness:  Pt calls the office today with rash on her face. She states this occurs every time she gets a back injection. She states when this occurs she takes benadryl and it resolves.  Rash The current episode started in the past 7 days. The problem has been gradually improving since onset. The affected locations include the face. The rash is characterized by redness. She was exposed to nothing. Pertinent negatives include no congestion, fatigue or fever. Treatments tried: benadryl. The treatment provided moderate relief.      Review of Systems  Constitutional: Negative for fatigue and fever.  HENT: Negative for congestion.   Skin: Positive for rash.  All other systems reviewed and are negative.    Observations/Objective: No SOB or distress noted  Assessment and  Plan: 1. Rash and nonspecific skin eruption Sounds like more like flushing from steroids  Make sure your Neuro knows about reaction  Continue to use Benadryl as needed Start Zyrtec daily Rest Do not scratch RTO if symptoms worsen or do not improve , pt scheduled for chronic follow up next week      I discussed the assessment and treatment plan with the patient. The patient was provided an opportunity to ask questions and all were answered. The patient agreed with the plan and demonstrated an understanding of the instructions.   The patient was advised to call back or seek an in-person evaluation if the symptoms worsen or if the condition fails to improve as anticipated.  The above assessment and management plan was discussed with the patient. The patient verbalized understanding of and has agreed to the management plan. Patient is aware to call the clinic if symptoms persist or worsen. Patient is aware when to return to the clinic for a follow-up visit. Patient educated on when it is appropriate to go to the emergency department.   Time call ended:  3:10 pm  I provided 12 minutes of non-face-to-face time during this encounter.    [name], FNP

## 2019-03-03 DIAGNOSIS — M48061 Spinal stenosis, lumbar region without neurogenic claudication: Secondary | ICD-10-CM | POA: Diagnosis not present

## 2019-03-03 DIAGNOSIS — Z6830 Body mass index (BMI) 30.0-30.9, adult: Secondary | ICD-10-CM | POA: Diagnosis not present

## 2019-03-03 DIAGNOSIS — I10 Essential (primary) hypertension: Secondary | ICD-10-CM | POA: Diagnosis not present

## 2019-03-13 ENCOUNTER — Telehealth (HOSPITAL_COMMUNITY): Payer: Self-pay

## 2019-03-13 NOTE — Telephone Encounter (Signed)
Left vm asking for patient to give [place] a call back and let us know if she is using [employer] or [facility] in [place]. We got a fax request from [facility] for bupropion but we last sent it to [facility]

## 2019-03-16 ENCOUNTER — Other Ambulatory Visit (HOSPITAL_COMMUNITY): Payer: Self-pay

## 2019-03-16 DIAGNOSIS — R69 Illness, unspecified: Secondary | ICD-10-CM | POA: Diagnosis not present

## 2019-03-16 MED ORDER — BUPROPION HCL ER (SR) 150 MG PO TB12: 150.0000 mg | ORAL_TABLET | Freq: Every day | ORAL | 2 refills | Status: DC

## 2019-03-19 ENCOUNTER — Ambulatory Visit (INDEPENDENT_AMBULATORY_CARE_PROVIDER_SITE_OTHER): Payer: Medicare HMO | Admitting: Psychiatry

## 2019-03-19 ENCOUNTER — Encounter (HOSPITAL_COMMUNITY): Payer: Self-pay | Admitting: Psychiatry

## 2019-03-19 DIAGNOSIS — F411 Generalized anxiety disorder: Secondary | ICD-10-CM

## 2019-03-19 DIAGNOSIS — F331 Major depressive disorder, recurrent, moderate: Secondary | ICD-10-CM

## 2019-03-19 DIAGNOSIS — F0630 Mood disorder due to known physiological condition, unspecified: Secondary | ICD-10-CM | POA: Diagnosis not present
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
## 2019-05-21 DIAGNOSIS — M48061 Spinal stenosis, lumbar region without neurogenic claudication: Secondary | ICD-10-CM | POA: Diagnosis not present

## 2019-05-26 ENCOUNTER — Other Ambulatory Visit: Payer: Self-pay | Admitting: Family

## 2019-05-26 DIAGNOSIS — F331 Major depressive disorder, recurrent, moderate: Secondary | ICD-10-CM

## 2019-05-26 DIAGNOSIS — F411 Generalized anxiety disorder: Secondary | ICD-10-CM

## 2019-05-26 MED ORDER — ATORVASTATIN CALCIUM 20 MG PO TABS: 20.0000 mg | ORAL_TABLET | Freq: Every day | ORAL | 0 refills | Status: DC

## 2019-05-26 MED ORDER — FENOFIBRATE 145 MG PO TABS: 145.0000 mg | ORAL_TABLET | Freq: Every day | ORAL | 0 refills | Status: DC

## 2019-05-26 MED ORDER — VENLAFAXINE HCL ER 150 MG PO CP24: ORAL_CAPSULE | ORAL | 0 refills | Status: DC

## 2019-05-26 NOTE — Telephone Encounter (Signed)
Sent - pt aware  

## 2019-05-28 ENCOUNTER — Other Ambulatory Visit: Payer: Self-pay | Admitting: Family

## 2019-06-03 ENCOUNTER — Encounter: Payer: Self-pay | Admitting: Family Medicine

## 2019-06-03 ENCOUNTER — Other Ambulatory Visit (HOSPITAL_COMMUNITY): Payer: Self-pay | Admitting: Psychiatry

## 2019-06-03 ENCOUNTER — Ambulatory Visit (INDEPENDENT_AMBULATORY_CARE_PROVIDER_SITE_OTHER): Payer: Medicare HMO | Admitting: Family Medicine

## 2019-06-03 ENCOUNTER — Other Ambulatory Visit: Payer: Self-pay

## 2019-06-03 ENCOUNTER — Other Ambulatory Visit: Payer: Self-pay | Admitting: Family

## 2019-06-03 DIAGNOSIS — N951 Menopausal and female climacteric states: Secondary | ICD-10-CM

## 2019-06-03 DIAGNOSIS — Z20828 Contact with and (suspected) exposure to other viral communicable diseases: Secondary | ICD-10-CM | POA: Diagnosis not present

## 2019-06-03 DIAGNOSIS — R519 Headache, unspecified: Secondary | ICD-10-CM | POA: Diagnosis not present

## 2019-06-03 NOTE — Progress Notes (Signed)
Subjective:    Patient ID: Brittany Lynch, female    DOB: [birth year], 59 y.o.   MRN: [id number]   HPI: Brittany Lynch is a 57 y.o. female presenting for gets hot dripping with sweat. Moody, fatigued. Decreased concentration. Had hyst at age 55. Took hormone medicine for 2-3 years afterward. Onset 2 weeks ago.   Recently exposed to [place]. Would like test.   Depression screen [place] 2/9 08/25/2018 08/14/2018 02/11/2018 12/10/2017 10/17/2017  Decreased Interest 3 1 0 2 3  Down, Depressed, Hopeless 3 2 0 2 2  PHQ - 2 Score 6 3 0 4 5  Altered sleeping 2 3 - 2 3  Tired, decreased energy 0 3 - 2 3  Change in appetite 2 3 - 2 1  Feeling bad or failure about yourself  0 3 - 1 2  Trouble concentrating 2 - - 2 2  Moving slowly or fidgety/restless 1 0 - 0 0  Suicidal thoughts 0 0 - 0 0  PHQ-9 Score 13 15 - 13 16     Relevant past medical, surgical, family and social history reviewed and updated as indicated.  Interim medical history since our last visit reviewed. Allergies and medications reviewed and updated.  ROS:  Review of Systems  Constitutional: Negative for fever.  HENT: Negative for congestion, rhinorrhea and sore throat.   Respiratory: Negative for cough and shortness of breath.   Cardiovascular: Negative for chest pain and palpitations.  Gastrointestinal: Negative for abdominal pain.  Musculoskeletal: Negative for arthralgias and myalgias.     Social History   Tobacco Use  Smoking Status Former Smoker  . Quit date: 07/16/2001  . Years since quitting: 17.8  Smokeless Tobacco Never Used       Objective:     Wt Readings from Last 3 Encounters:  08/25/18 161 lb 12.8 oz (73.4 kg)  08/14/18 161 lb 9.6 oz (73.3 kg)  03/06/18 161 lb 9.6 oz (73.3 kg)     Exam deferred. Pt. [place] due to COVID 19. Phone visit performed.   Assessment & Plan:   1. Hot flashes due to menopause     No orders of the defined types were placed in this encounter.   Orders Placed This  Encounter  Procedures  . FSH/LH  . Estrogen      Diagnoses and all orders for this visit:  Hot flashes due to menopause -     FSH/LH -     Estrogen  Use OTC remifemin for symptom relief.  Virtual Visit via telephone Note  I discussed the limitations, risks, security and privacy concerns of performing an evaluation and management service by telephone and the availability of in person appointments. The patient was identified with two identifiers. Pt.expressed understanding and agreed to proceed. Pt. Is at home. Dr. Livia Snellen is in his office.  Follow Up Instructions:   I discussed the assessment and treatment plan with the patient. The patient was provided an opportunity to ask questions and all were answered. The patient agreed with the plan and demonstrated an understanding of the instructions.   The patient was advised to call back or seek an in-person evaluation if the symptoms worsen or if the condition fails to improve as anticipated.   Total minutes including chart review and phone contact time: 15   Follow up plan: Return in about 2 weeks (around 06/17/2019), or after COVID test if negative, longer if positive., for hot flashes, with [name].  [name], MD [name]  [name]

## 2019-06-18 DIAGNOSIS — F1120 Opioid dependence, uncomplicated: Secondary | ICD-10-CM | POA: Diagnosis not present

## 2019-06-18 DIAGNOSIS — M48061 Spinal stenosis, lumbar region without neurogenic claudication: Secondary | ICD-10-CM | POA: Diagnosis not present

## 2019-06-19 ENCOUNTER — Telehealth: Payer: Self-pay | Admitting: Family

## 2019-06-19 MED ORDER — METOPROLOL TARTRATE 50 MG PO TABS: 50.0000 mg | ORAL_TABLET | Freq: Two times a day (BID) | ORAL | 0 refills | Status: DC

## 2019-06-19 NOTE — Telephone Encounter (Signed)
Metoprolol prescription sent to [facility].

## 2019-06-19 NOTE — Telephone Encounter (Signed)
Patient called stating that she only has 1 pill of her BP meds left (metoprolol) and needs a refill sent to the [facility] in [place].

## 2019-06-22 ENCOUNTER — Other Ambulatory Visit: Payer: Self-pay | Admitting: *Deleted

## 2019-07-20 ENCOUNTER — Other Ambulatory Visit: Payer: Self-pay

## 2019-07-20 ENCOUNTER — Other Ambulatory Visit: Payer: Medicare Other

## 2019-07-21 ENCOUNTER — Ambulatory Visit (INDEPENDENT_AMBULATORY_CARE_PROVIDER_SITE_OTHER): Payer: Medicare Other | Admitting: Psychiatry

## 2019-07-21 ENCOUNTER — Encounter (HOSPITAL_COMMUNITY): Payer: Self-pay | Admitting: Psychiatry

## 2019-07-21 ENCOUNTER — Telehealth: Payer: Self-pay | Admitting: Family

## 2019-07-21 ENCOUNTER — Ambulatory Visit (INDEPENDENT_AMBULATORY_CARE_PROVIDER_SITE_OTHER): Payer: Medicare Other | Admitting: Family

## 2019-07-21 ENCOUNTER — Encounter: Payer: Self-pay | Admitting: Family

## 2019-07-21 DIAGNOSIS — Z7289 Other problems related to lifestyle: Secondary | ICD-10-CM | POA: Diagnosis not present

## 2019-07-21 DIAGNOSIS — F331 Major depressive disorder, recurrent, moderate: Secondary | ICD-10-CM

## 2019-07-21 DIAGNOSIS — R232 Flushing: Secondary | ICD-10-CM | POA: Diagnosis not present

## 2019-07-21 DIAGNOSIS — F411 Generalized anxiety disorder: Secondary | ICD-10-CM | POA: Diagnosis not present

## 2019-07-21 DIAGNOSIS — F39 Unspecified mood [affective] disorder: Secondary | ICD-10-CM | POA: Diagnosis not present

## 2019-07-21 DIAGNOSIS — F0630 Mood disorder due to known physiological condition, unspecified: Secondary | ICD-10-CM

## 2019-07-21 MED ORDER — BUPROPION HCL ER (SR) 150 MG PO TB12: 150.0000 mg | ORAL_TABLET | Freq: Every day | ORAL | 0 refills | Status: DC

## 2019-07-21 NOTE — Progress Notes (Signed)
Tele psych follow up [facility] Patient Identification: Brittany Lynch MRN:  [id number] Date of Evaluation:  07/21/2019 Referral Source: primary care Chief Complaint:    depression follow up  Visit Diagnosis:    ICD-10-CM   1. Mood disorder in conditions classified elsewhere  F06.30   2. GAD (generalized anxiety disorder)  F41.1   3. Moderate episode of recurrent major depressive disorder (HCC)  F33.1     History of Present Illness: 59 years old currently married but separated Caucasian female living with her daughter and grandkids.  Referred initially  by primary care physician for management of mood symptoms   I connected with Brittany Lynch on 07/21/19 at  1:30 PM EST by telephone and verified that I am speaking with the correct person using two identifiers    I discussed the limitations, risks, security and privacy concerns of performing an evaluation and management service by telephone and the availability of in person appointments. I also discussed with the patient that there may be a patient responsible charge related to this service. The patient expressed understanding and agreed to proceed.    She has a back condition she is on pain medication also gabapentin Mostly lies in bed during the day,  Otherwise fair in regard to depression Gets most meds from rpimary care except wellbutrin Attends AA. 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  Consequences of Substance Abuse: NA  Past Medical History:  Past Medical History:  Diagnosis Date  . Anxiety   . Depression   . Hyperlipidemia   . Hypertension   . Thyroid disease     Past Surgical History:  Procedure Laterality Date  . ABDOMINAL HYSTERECTOMY    .  LUMBAR LAMINECTOMY/DECOMPRESSION MICRODISCECTOMY Bilateral 03/06/2018   Procedure: Laminectomy and Foraminotomy - Lumbar Two-Three, Three-Four bilateral;  Surgeon: Eustace Moore, MD;  Location: [facility];  Service: Neurosurgery;  Laterality: Bilateral;  Laminectomy and Foraminotomy - Lumbar Two-Three, Three-Four bilateral  . NECK SURGERY    . Right hand and wrist surgery Right    Laceration   . SPINE SURGERY      Family Psychiatric History: mom side had schizophrenia.  Mom and DAD : alcohol use Sister, daughter bipolar  Family History:  Family History  Problem Relation Age of Onset  . Mental illness Mother   . Alcohol abuse Father   . Diabetes Father   . Heart disease Father     Social History:   Social History   Socioeconomic History  . Marital status: Legally Separated    Spouse name: Not on file  . Number of children: Not on file  . Years of education: Not on file  . Highest education level: Not on file  Occupational History  . Not on file  Tobacco Use  . Smoking status: Former Smoker    Quit date: 07/16/2001    Years since quitting: 18.0  . Smokeless tobacco: Never Used  Substance and Sexual Activity  . Alcohol use: No    Comment: hx alcoholism. none since age 56  . Drug use: No    Comment: none since age 61  . Sexual activity: Not on file  Other Topics Concern  . Not on file  Social History Narrative  .  Not on file   Social Determinants of Health   Financial Resource Strain:   . Difficulty of Paying Living Expenses: Not on file  Food Insecurity:   . Worried About Charity fundraiser in the Last Year: Not on file  . Ran Out of Food in the Last Year: Not on file  Transportation Needs:   . Lack of Transportation (Medical): Not on file  . Lack of Transportation (Non-Medical): Not on file  Physical Activity:   . Days of Exercise per Week: Not on file  . Minutes of Exercise per Session: Not on file  Stress:   . Feeling of Stress : Not on file  Social  Connections:   . Frequency of Communication with Friends and Family: Not on file  . Frequency of Social Gatherings with Friends and Family: Not on file  . Attends Religious Services: Not on file  . Active Member of Clubs or Organizations: Not on file  . Attends Archivist Meetings: Not on file  . Marital Status: Not on file     Allergies:   Allergies  Allergen Reactions  . Chocolate Shortness Of Breath and Rash  . Iodinated Diagnostic Agents Anaphylaxis, Shortness Of Breath and Rash    Can tolerate with Benadryl.   . Methotrexate Derivatives Shortness Of Breath and Rash  . Lisinopril Cough  . Tape Rash and Other (See Comments)    Paper tape only.    Metabolic Disorder Labs: Lab Results  Component Value Date   HGBA1C 5.3 08/25/2018   No results found for: PROLACTIN Lab Results  Component Value Date   CHOL 188 08/14/2018   TRIG 151 (H) 08/14/2018   HDL 63 08/14/2018   CHOLHDL 3.0 08/14/2018   VLDL 38 12/27/2016   [place] 95 08/14/2018   [place] Comment 10/17/2017   Lab Results  Component Value Date   TSH 2.120 08/14/2018    Therapeutic Level Labs: No results found for: LITHIUM No results found for: CBMZ No results found for: VALPROATE  Current Medications: Current Outpatient Medications  Medication Sig Dispense Refill  . atorvastatin (LIPITOR) 20 MG tablet Take 1 tablet by mouth once daily 90 tablet 0  . buPROPion (WELLBUTRIN SR) 150 MG 12 hr tablet Take 1 tablet (150 mg total) by mouth daily. 90 tablet 0  . busPIRone (BUSPAR) 10 MG tablet TAKE 1 TABLET BY MOUTH THREE TIMES DAILY 90 tablet 0  . fenofibrate (TRICOR) 145 MG tablet Take 1 tablet by mouth once daily 90 tablet 0  . ferrous sulfate 325 (65 FE) MG tablet Take 325 mg by mouth every evening.    . gabapentin (NEURONTIN) 600 MG tablet Take 600 mg by mouth 2 (two) times daily.     Marland Kitchen levothyroxine (SYNTHROID) 88 MCG tablet TAKE 1 TABLET EVERY DAY 90 tablet 0  . Menthol, Topical Analgesic, (ICY  HOT EX) Apply 1 patch topically daily as needed (pain).     . metoprolol tartrate (LOPRESSOR) 50 MG tablet Take 1 tablet (50 mg total) by mouth 2 (two) times daily. 180 tablet 0  . Multiple Vitamin (MULTIVITAMIN WITH MINERALS) TABS tablet Take 1 tablet by mouth daily. Women's 50+    . omeprazole (PRILOSEC) 40 MG capsule Take 1 capsule (40 mg total) by mouth daily. 90 capsule 3  . oxyCODONE (OXY IR/ROXICODONE) 5 MG immediate release tablet Take 5-10 mg by mouth every 8 (eight) hours as needed for severe pain.    Marland Kitchen oxyCODONE (OXY IR/ROXICODONE) 5 MG immediate release tablet  Take 1 tablet (5 mg total) by mouth every 4 (four) hours as needed for severe pain. 30 tablet 0  . phenazopyridine (PYRIDIUM) 100 MG tablet Take 1 tablet (100 mg total) by mouth 3 (three) times daily as needed for pain. 10 tablet 0  . venlafaxine XR (EFFEXOR-XR) 150 MG 24 hr capsule TAKE 1 CAPSULE BY MOUTH ONCE DAILY WITH BREAKFAST 90 capsule 0   No current facility-administered medications for this visit.      Psychiatric Specialty Exam: Review of Systems  Cardiovascular: Negative for chest pain.  Skin: Negative for rash.  Psychiatric/Behavioral: Negative for depression and suicidal ideas.    There were no vitals taken for this visit.There is no height or weight on file to calculate BMI.  General Appearance:   Eye Contact:    Speech:  Slow  Volume:  Decreased  Mood: fair  Affect:   Thought Process:  Goal Directed  Orientation:  Full (Time, Place, and Person)  Thought Content:  Rumination  Suicidal Thoughts:  No  Homicidal Thoughts:  No  Memory:  Immediate;   Fair Recent;   Fair  Judgement:  Fair  Insight:  Shallow  Psychomotor Activity:    Concentration:  Concentration: Fair and Attention Span: Fair  Recall:  [employer] of Knowledge:Fair  Language: Fair  Akathisia:  No  Handed:  Right  AIMS (if indicated):  not done  Assets:  Desire for Improvement  ADL's:  Intact  Cognition: WNL  Sleep:  Fair    Screenings: GAD-7     Office Visit from 08/14/2018 in [place]  Total GAD-7 Score  14    Mini-Mental     Office Visit from 08/25/2018 in [place]  Total Score (max 30 points )  29    PHQ2-9     Office Visit from 08/25/2018 in [place] Visit from 08/14/2018 in [place] Visit from 02/11/2018 in [place] Visit from 12/10/2017 in [place] Visit from 10/17/2017 in [place]  PHQ-2 Total Score  6  3  0  4  5  PHQ-9 Total Score  13  15  --  13  16      Assessment and Plan: as follows MOod disorder unspecified, doing fair, continue wellbutrin She is on gabapentin from primary care.   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 The patient was provided an opportunity to ask questions and all were answered. The patient agreed with the plan and demonstrated an understanding of the instructions.   The patient was advised to call back or seek an in-person evaluation if the symptoms worsen or if the condition fails to improve as anticipated. wellbutrin sent I provided 15 minutes of non-face-to-face time during this encounter. Fu 3-62m Merian Capron, MD 1/5/20211:36 PM

## 2019-07-21 NOTE — Progress Notes (Signed)
   Virtual Visit via telephone Note Due to COVID-19 pandemic this visit was conducted virtually. This visit type was conducted due to national recommendations for restrictions regarding the COVID-19 Pandemic (e.g. social distancing, sheltering in place) in an effort to limit this patient's exposure and mitigate transmission in our community. All issues noted in this document were discussed and addressed.  A physical exam was not performed with this format.  I connected with Brittany Lynch on 07/21/19 at 1:30 pm by telephone and verified that I am speaking with the correct person using two identifiers. Brittany Lynch is currently located at home and no one is currently with her during visit. The provider, [name], FNP is located in their office at time of visit.  I discussed the limitations, risks, security and privacy concerns of performing an evaluation and management service by telephone and the availability of in person appointments. I also discussed with the patient that there may be a patient responsible charge related to this service. The patient expressed understanding and agreed to proceed.   History and Present Illness:  HPI  PT calls the office today with complaints hot flashes and sweating that started 2-3 months ago. However, it has worsen. She reports it will come on at any time and her head will be "dripping wet".  She reports she had a hysterectomy about 17 years ago. She reports she took hormone replacement therapy for a year after her hysterectomy.   She denies any hx of breast cancer or family hx of breast cancer.   Review of Systems  Skin:       Sweating   All other systems reviewed and are negative.    Observations/Objective: No SOB or distress noted   Assessment and Plan: 1. Hot flashes Will add other lab work. Hormone levels are not back yet.  -If labs are normal we may think about hormone therapy - CMP14+EGFR - Anemia Profile with CBC - TSH    I  discussed the assessment and treatment plan with the patient. The patient was provided an opportunity to ask questions and all were answered. The patient agreed with the plan and demonstrated an understanding of the instructions.   The patient was advised to call back or seek an in-person evaluation if the symptoms worsen or if the condition fails to improve as anticipated.  The above assessment and management plan was discussed with the patient. The patient verbalized understanding of and has agreed to the management plan. Patient is aware to call the clinic if symptoms persist or worsen. Patient is aware when to return to the clinic for a follow-up visit. Patient educated on when it is appropriate to go to the emergency department.   Time call ended:  1:44 pm  I provided 14 minutes of non-face-to-face time during this encounter.    [name], FNP

## 2019-07-21 NOTE — Telephone Encounter (Signed)
Patient aware that all results are not back yet and we will call as soon as they come in. Patient also advised to come in for labs that Alyse Low has ordered.

## 2019-07-22 ENCOUNTER — Other Ambulatory Visit: Payer: Self-pay

## 2019-07-22 ENCOUNTER — Other Ambulatory Visit: Payer: Medicare Other

## 2019-07-22 LAB — FSH/LH
FSH: 82.9 m[IU]/mL
LH: 41.1 m[IU]/mL

## 2019-07-22 LAB — SPECIMEN STATUS REPORT

## 2019-07-22 LAB — ESTROGENS, TOTAL: Estrogen: 71 pg/mL

## 2019-07-24 ENCOUNTER — Telehealth: Payer: Self-pay | Admitting: *Deleted

## 2019-07-24 LAB — CMP14+EGFR
ALT: 44 IU/L — ABNORMAL HIGH (ref 0–32)
AST: 29 IU/L (ref 0–40)
Albumin/Globulin Ratio: 2.2 (ref 1.2–2.2)
Albumin: 4.8 g/dL (ref 3.8–4.9)
Alkaline Phosphatase: 65 IU/L (ref 39–117)
BUN/Creatinine Ratio: 10 (ref 9–23)
BUN: 10 mg/dL (ref 6–24)
Bilirubin Total: 0.3 mg/dL (ref 0.0–1.2)
CO2: 24 mmol/L (ref 20–29)
Calcium: 10 mg/dL (ref 8.7–10.2)
Chloride: 99 mmol/L (ref 96–106)
Creatinine, Ser: 0.96 mg/dL (ref 0.57–1.00)
GFR calc Af Amer: 75 mL/min/{1.73_m2} (ref >59)
GFR calc non Af Amer: 65 mL/min/{1.73_m2} (ref >59)
Globulin, Total: 2.2 g/dL (ref 1.5–4.5)
Glucose: 76 mg/dL (ref 65–99)
Potassium: 4.5 mmol/L (ref 3.5–5.2)
Sodium: 138 mmol/L (ref 134–144)
Total Protein: 7 g/dL (ref 6.0–8.5)

## 2019-07-24 LAB — ANEMIA PROFILE B
Basophils Absolute: 0.1 10*3/uL (ref 0.0–0.2)
Basos: 1 %
EOS (ABSOLUTE): 0.1 10*3/uL (ref 0.0–0.4)
Eos: 1 %
Ferritin: 49 ng/mL (ref 15–150)
Folate: >20 ng/mL (ref >3.0)
Hematocrit: 39.6 % (ref 34.0–46.6)
Hemoglobin: 13.1 g/dL (ref 11.1–15.9)
Immature Grans (Abs): 0 10*3/uL (ref 0.0–0.1)
Immature Granulocytes: 0 %
Iron Saturation: 18 % (ref 15–55)
Iron: 87 ug/dL (ref 27–159)
Lymphocytes Absolute: 3.2 10*3/uL — ABNORMAL HIGH (ref 0.7–3.1)
Lymphs: 49 %
MCH: 28.5 pg (ref 26.6–33.0)
MCHC: 33.1 g/dL (ref 31.5–35.7)
MCV: 86 fL (ref 79–97)
Monocytes Absolute: 0.6 10*3/uL (ref 0.1–0.9)
Monocytes: 8 %
Neutrophils Absolute: 2.7 10*3/uL (ref 1.4–7.0)
Neutrophils: 41 %
Platelets: 437 10*3/uL (ref 150–450)
RBC: 4.6 x10E6/uL (ref 3.77–5.28)
RDW: 13.2 % (ref 11.7–15.4)
Retic Ct Pct: 1.6 % (ref 0.6–2.6)
Total Iron Binding Capacity: 484 ug/dL — ABNORMAL HIGH (ref 250–450)
UIBC: 397 ug/dL (ref 131–425)
Vitamin B-12: 608 pg/mL (ref 232–1245)
WBC: 6.6 10*3/uL (ref 3.4–10.8)

## 2019-07-24 LAB — TSH: TSH: 1.72 u[IU]/mL (ref 0.450–4.500)

## 2019-07-24 NOTE — Telephone Encounter (Signed)
Still suffering with profuse sweating because of menopause.  Could you prescribe any thing to help with this.  Tried black cohash with no relief.

## 2019-07-27 ENCOUNTER — Telehealth: Payer: Self-pay | Admitting: Family

## 2019-07-27 MED ORDER — ESTRADIOL 0.5 MG PO TABS: 0.5000 mg | ORAL_TABLET | Freq: Every day | ORAL | 1 refills | Status: DC

## 2019-07-27 NOTE — Telephone Encounter (Signed)
Patient wants a hormone for her excessive sweating and moody. Per her last visit.

## 2019-07-27 NOTE — Telephone Encounter (Signed)
The 10-year ASCVD risk score Mikey Bussing DC Brooke Bonito., et al., 2013) is: 2.5%   Values used to calculate the score:     Age: 59 years     Sex: Female     Is Non-Hispanic African American: No     Diabetic: No     Tobacco smoker: No     Systolic Blood Pressure: [id number] mmHg     Is BP treated: Yes     HDL Cholesterol: 63 mg/dL     Total Cholesterol: 188 mg/dL  No hx of breast cancer, DVT, CVA. Denies any tobacco use.   Had a hx of hysterectomy.     Estradiol 0.5 mg once a day Prescription sent to pharmacy.  Risks discussed.

## 2019-08-25 ENCOUNTER — Ambulatory Visit (INDEPENDENT_AMBULATORY_CARE_PROVIDER_SITE_OTHER): Payer: Medicare Other | Admitting: Family Medicine

## 2019-08-25 ENCOUNTER — Encounter: Payer: Self-pay | Admitting: Family Medicine

## 2019-08-25 DIAGNOSIS — R3989 Other symptoms and signs involving the genitourinary system: Secondary | ICD-10-CM | POA: Diagnosis not present

## 2019-08-25 MED ORDER — NITROFURANTOIN MONOHYD MACRO 100 MG PO CAPS: 100.0000 mg | ORAL_CAPSULE | Freq: Two times a day (BID) | ORAL | 0 refills | Status: AC

## 2019-08-25 NOTE — Progress Notes (Signed)
Virtual Visit via Telephone Note  I connected with Brittany Lynch on 08/25/19 at 4:55 PM by telephone and verified that I am speaking with the correct person using two identifiers. Brittany Lynch is currently located at home and nobody is currently with her during this visit. The provider, Loman Brooklyn, FNP is located in their office at time of visit.  I discussed the limitations, risks, security and privacy concerns of performing an evaluation and management service by telephone and the availability of in person appointments. I also discussed with the patient that there may be a patient responsible charge related to this service. The patient expressed understanding and agreed to proceed.  Subjective: PCP: Sharion Balloon, FNP  Chief Complaint  Patient presents with  . Urinary Tract Infection   Urinary Tract Infection: Patient complains of abnormal smelling urine, dysuria and inability to void She has had symptoms for 1 month. Patient was taking some AZO and got some relief but not it has returned. Patient does have a history of recurrent UTI.     ROS: Per HPI  Current Outpatient Medications:  .  atorvastatin (LIPITOR) 20 MG tablet, Take 1 tablet by mouth once daily, Disp: 90 tablet, Rfl: 0 .  buPROPion (WELLBUTRIN SR) 150 MG 12 hr tablet, Take 1 tablet (150 mg total) by mouth daily., Disp: 90 tablet, Rfl: 0 .  busPIRone (BUSPAR) 10 MG tablet, TAKE 1 TABLET BY MOUTH THREE TIMES DAILY, Disp: 90 tablet, Rfl: 0 .  estradiol (ESTRACE) 0.5 MG tablet, Take 1 tablet (0.5 mg total) by mouth daily., Disp: 30 tablet, Rfl: 1 .  fenofibrate (TRICOR) 145 MG tablet, Take 1 tablet by mouth once daily, Disp: 90 tablet, Rfl: 0 .  ferrous sulfate 325 (65 FE) MG tablet, Take 325 mg by mouth every evening., Disp: , Rfl:  .  gabapentin (NEURONTIN) 600 MG tablet, Take 600 mg by mouth 2 (two) times daily. , Disp: , Rfl:  .  levothyroxine (SYNTHROID) 88 MCG tablet, TAKE 1 TABLET EVERY DAY, Disp: 90  tablet, Rfl: 0 .  Menthol, Topical Analgesic, (ICY HOT EX), Apply 1 patch topically daily as needed (pain). , Disp: , Rfl:  .  metoprolol tartrate (LOPRESSOR) 50 MG tablet, Take 1 tablet (50 mg total) by mouth 2 (two) times daily., Disp: 180 tablet, Rfl: 0 .  Multiple Vitamin (MULTIVITAMIN WITH MINERALS) TABS tablet, Take 1 tablet by mouth daily. Women's 50+, Disp: , Rfl:  .  omeprazole (PRILOSEC) 40 MG capsule, Take 1 capsule (40 mg total) by mouth daily., Disp: 90 capsule, Rfl: 3 .  oxyCODONE (OXY IR/ROXICODONE) 5 MG immediate release tablet, Take 5-10 mg by mouth every 8 (eight) hours as needed for severe pain., Disp: , Rfl:  .  venlafaxine XR (EFFEXOR-XR) 150 MG 24 hr capsule, TAKE 1 CAPSULE BY MOUTH ONCE DAILY WITH BREAKFAST, Disp: 90 capsule, Rfl: 0  Allergies  Allergen Reactions  . Chocolate Shortness Of Breath and Rash  . Iodinated Diagnostic Agents Anaphylaxis, Shortness Of Breath and Rash    Can tolerate with Benadryl.   . Methotrexate Derivatives Shortness Of Breath and Rash  . Lisinopril Cough  . Tape Rash and Other (See Comments)    Paper tape only.   Past Medical History:  Diagnosis Date  . Anxiety   . Depression   . Hyperlipidemia   . Hypertension   . Thyroid disease     Observations/Objective: A&O  No respiratory distress or wheezing audible over the phone Mood, judgement,  and thought processes all WNL  Assessment and Plan: 1. Suspected UTI - Education provided on UTIs. Encouraged adequate hydration.  Patient will come in about a week after completing antibiotics and leave a urine sample to ensure resolution. - nitrofurantoin, macrocrystal-monohydrate, (MACROBID) 100 MG capsule; Take 1 capsule (100 mg total) by mouth 2 (two) times daily for 5 days. 1 po BId  Dispense: 10 capsule; Refill: 0 - Urinalysis, Complete; Future   Follow Up Instructions:  I discussed the assessment and treatment plan with the patient. The patient was provided an opportunity to ask  questions and all were answered. The patient agreed with the plan and demonstrated an understanding of the instructions.   The patient was advised to call back or seek an in-person evaluation if the symptoms worsen or if the condition fails to improve as anticipated.  The above assessment and management plan was discussed with the patient. The patient verbalized understanding of and has agreed to the management plan. Patient is aware to call the clinic if symptoms persist or worsen. Patient is aware when to return to the clinic for a follow-up visit. Patient educated on when it is appropriate to go to the emergency department.   Time call ended: 4:59 PM  I provided 7 minutes of non-face-to-face time during this encounter.  Hendricks Limes, MSN, APRN, FNP-C [facility] 08/25/19

## 2019-08-25 NOTE — Patient Instructions (Signed)
Urinary Tract Infection, Adult A urinary tract infection (UTI) is an infection of any part of the urinary tract. The urinary tract includes:  The kidneys.  The ureters.  The bladder.  The urethra. These organs make, store, and get rid of pee (urine) in the body. What are the causes? This is caused by germs (bacteria) in your genital area. These germs grow and cause swelling (inflammation) of your urinary tract. What increases the risk? You are more likely to develop this condition if:  You have a small, thin tube (catheter) to drain pee.  You cannot control when you pee or poop (incontinence).  You are female, and: ? You use these methods to prevent pregnancy:  A medicine that kills sperm (spermicide).  A device that blocks sperm (diaphragm). ? You have low levels of a female hormone (estrogen). ? You are pregnant.  You have genes that add to your risk.  You are sexually active.  You take antibiotic medicines.  You have trouble peeing because of: ? A prostate that is bigger than normal, if you are female. ? A blockage in the part of your body that drains pee from the bladder (urethra). ? A kidney stone. ? A nerve condition that affects your bladder (neurogenic bladder). ? Not getting enough to drink. ? Not peeing often enough.  You have other conditions, such as: ? Diabetes. ? A weak disease-fighting system (immune system). ? Sickle cell disease. ? Gout. ? Injury of the spine. What are the signs or symptoms? Symptoms of this condition include:  Needing to pee right away (urgently).  Peeing often.  Peeing small amounts often.  Pain or burning when peeing.  Blood in the pee.  Pee that smells bad or not like normal.  Trouble peeing.  Pee that is cloudy.  Fluid coming from the vagina, if you are female.  Pain in the belly or lower back. Other symptoms include:  Throwing up (vomiting).  No urge to eat.  Feeling mixed up (confused).  Being tired  and grouchy (irritable).  A fever.  Watery poop (diarrhea). How is this treated? This condition may be treated with:  Antibiotic medicine.  Other medicines.  Drinking enough water. Follow these instructions at home:  Medicines  Take over-the-counter and prescription medicines only as told by your doctor.  If you were prescribed an antibiotic medicine, take it as told by your doctor. Do not stop taking it even if you start to feel better. General instructions  Make sure you: ? Pee until your bladder is empty. ? Do not hold pee for a long time. ? Empty your bladder after sex. ? Wipe from front to back after pooping if you are a female. Use each tissue one time when you wipe.  Drink enough fluid to keep your pee pale yellow.  Keep all follow-up visits as told by your doctor. This is important. Contact a doctor if:  You do not get better after 1-2 days.  Your symptoms go away and then come back. Get help right away if:  You have very bad back pain.  You have very bad pain in your lower belly.  You have a fever.  You are sick to your stomach (nauseous).  You are throwing up. Summary  A urinary tract infection (UTI) is an infection of any part of the urinary tract.  This condition is caused by germs in your genital area.  There are many risk factors for a UTI. These include having a small, thin   tube to drain pee and not being able to control when you pee or poop.  Treatment includes antibiotic medicines for germs.  Drink enough fluid to keep your pee pale yellow. This information is not intended to replace advice given to you by your health care provider. Make sure you discuss any questions you have with your health care provider. Document Revised: 06/19/2018 Document Reviewed: 01/09/2018 Elsevier Patient Education  2020 [employer]  

## 2019-08-27 ENCOUNTER — Ambulatory Visit (INDEPENDENT_AMBULATORY_CARE_PROVIDER_SITE_OTHER): Payer: Medicare Other | Admitting: *Deleted

## 2019-08-27 ENCOUNTER — Other Ambulatory Visit: Payer: Self-pay

## 2019-08-27 VITALS — Ht 62.0 in | Wt 159.0 lb

## 2019-08-27 DIAGNOSIS — Z0000 Encounter for general adult medical examination without abnormal findings: Secondary | ICD-10-CM | POA: Diagnosis not present

## 2019-08-27 NOTE — Progress Notes (Addendum)
MEDICARE ANNUAL WELLNESS VISIT  08/27/2019  Telephone Visit Disclaimer This Medicare AWV was conducted by telephone due to national recommendations for restrictions regarding the COVID-19 Pandemic (e.g. social distancing).  I verified, using two identifiers, that I am speaking with Brittany Lynch or their authorized healthcare agent. I discussed the limitations, risks, security, and privacy concerns of performing an evaluation and management service by telephone and the potential availability of an in-person appointment in the future. The patient expressed understanding and agreed to proceed.   Subjective:  Brittany Lynch is a 59 y.o. female patient of Hawks, Theador Hawthorne, FNP who had a Medicare Annual Wellness Visit today via telephone. Brittany Lynch is Retired and lives alone. she has 1 child. she reports that she is socially active and does interact with friends/family regularly. she is minimally physically active and enjoys music.  Patient Care Team: Sharion Balloon, FNP as PCP - General (Family Medicine) Daneil Dolin, MD as Consulting Physician (Gastroenterology)  Advanced Directives 08/27/2019 08/25/2018 03/06/2018 02/27/2018 02/16/2015 01/25/2015 01/25/2015  Does Patient Have a Medical Advance Directive? No No No No No No No  Would patient like information on creating a medical advance directive? Yes (MAU/Ambulatory/Procedural Areas - Information given) Yes (ED - Information included in AVS) No - Patient declined No - Patient declined - No - patient declined information -    Hospital Utilization Over the Past 12 Months: # of hospitalizations or ER visits: 0 # of surgeries: 0  Review of Systems    Patient reports that her overall health is unchanged compared to last year.  History obtained from chart review and the patient General ROS: negative  Patient Reported Readings (BP, Pulse, CBG, Weight, etc) none  Pain Assessment Pain : 0-10 Pain Score: 8  Pain Type: Chronic pain Pain  Location: Back Pain Orientation: Lower, Mid Pain Descriptors / Indicators: Constant Pain Onset: More than a month ago Pain Frequency: Constant Pain Relieving Factors: Pain Medication  Pain Relieving Factors: Pain Medication  Current Medications & Allergies (verified) Allergies as of 08/27/2019       Reactions   Chocolate Shortness Of Breath, Rash   Iodinated Diagnostic Agents Anaphylaxis, Shortness Of Breath, Rash   Can tolerate with Benadryl.   Methotrexate Derivatives Shortness Of Breath, Rash   Lisinopril Cough   Tape Rash, Other (See Comments)   Paper tape only.        Medication List        Accurate as of August 27, 2019 10:01 AM. If you have any questions, ask your nurse or doctor.          atorvastatin 20 MG tablet Commonly known as: LIPITOR Take 1 tablet by mouth once daily   buPROPion 150 MG 12 hr tablet Commonly known as: WELLBUTRIN SR Take 1 tablet (150 mg total) by mouth daily.   busPIRone 10 MG tablet Commonly known as: BUSPAR TAKE 1 TABLET BY MOUTH THREE TIMES DAILY   estradiol 0.5 MG tablet Commonly known as: ESTRACE Take 1 tablet (0.5 mg total) by mouth daily.   fenofibrate 145 MG tablet Commonly known as: TRICOR Take 1 tablet by mouth once daily   ferrous sulfate 325 (65 FE) MG tablet Take 325 mg by mouth every evening.   gabapentin 600 MG tablet Commonly known as: NEURONTIN Take 600 mg by mouth 2 (two) times daily.   ICY HOT EX Apply 1 patch topically daily as needed (pain).   levothyroxine 88 MCG tablet Commonly known as: SYNTHROID  TAKE 1 TABLET EVERY DAY   metoprolol tartrate 50 MG tablet Commonly known as: LOPRESSOR Take 1 tablet (50 mg total) by mouth 2 (two) times daily.   multivitamin with minerals Tabs tablet Take 1 tablet by mouth daily. Women's 50+   nitrofurantoin (macrocrystal-monohydrate) 100 MG capsule Commonly known as: Macrobid Take 1 capsule (100 mg total) by mouth 2 (two) times daily for 5 days. 1 po  BId   omeprazole 40 MG capsule Commonly known as: PRILOSEC Take 1 capsule (40 mg total) by mouth daily.   oxyCODONE 5 MG immediate release tablet Commonly known as: Oxy IR/ROXICODONE Take 5-10 mg by mouth every 8 (eight) hours as needed for severe pain.   venlafaxine XR 150 MG 24 hr capsule Commonly known as: EFFEXOR-XR TAKE 1 CAPSULE BY MOUTH ONCE DAILY WITH BREAKFAST        History (reviewed): Past Medical History:  Diagnosis Date   Anxiety    Depression    Hyperlipidemia    Hypertension    Thyroid disease    Past Surgical History:  Procedure Laterality Date   ABDOMINAL HYSTERECTOMY     LUMBAR LAMINECTOMY/DECOMPRESSION MICRODISCECTOMY Bilateral 03/06/2018   Procedure: Laminectomy and Foraminotomy - Lumbar Two-Three, Three-Four bilateral;  Surgeon: Eustace Moore, MD;  Location: [facility];  Service: Neurosurgery;  Laterality: Bilateral;  Laminectomy and Foraminotomy - Lumbar Two-Three, Three-Four bilateral   NECK SURGERY     Right hand and wrist surgery Right    Laceration    SPINE SURGERY     Family History  Problem Relation Age of Onset   Mental illness Mother    Alcohol abuse Father    Diabetes Father    Heart disease Father    Social History   Socioeconomic History   Marital status: Legally Separated    Spouse name: Not on file   Number of children: Not on file   Years of education: Not on file   Highest education level: 9th grade  Occupational History   Occupation: Retired  Tobacco Use   Smoking status: Former Smoker    Quit date: 07/16/2001    Years since quitting: 18.1   Smokeless tobacco: Never Used  Substance and Sexual Activity   Alcohol use: No    Comment: hx alcoholism. none since age 51   Drug use: No    Comment: none since age 89   Sexual activity: Yes  Other Topics Concern   Not on file  Social History Narrative   Not on file   Social Determinants of Health   Financial Resource Strain: Low Risk    Difficulty of Paying Living Expenses:  Not very hard  Food Insecurity: No Food Insecurity   Worried About Charity fundraiser in the Last Year: Never true   Ran Out of Food in the Last Year: Never true  Transportation Needs: No Transportation Needs   Lack of Transportation (Medical): No   Lack of Transportation (Non-Medical): No  Physical Activity: Inactive   Days of Exercise per Week: 0 days   Minutes of Exercise per Session: 0 min  Stress: No Stress Concern Present   Feeling of Stress : Not at all  Social Connections: Somewhat Isolated   Frequency of Communication with Friends and Family: More than three times a week   Frequency of Social Gatherings with Friends and Family: Twice a week   Attends Religious Services: More than 4 times per year   Active Member of [employer] or Organizations: No   Attends [employer]  or Organization Meetings: Never   Marital Status: Separated    Activities of Daily Living In your present state of health, do you have any difficulty performing the following activities: 08/27/2019  Hearing? N  Vision? N  Difficulty concentrating or making decisions? N  Walking or climbing stairs? Y  Comment Long distance  Dressing or bathing? N  Doing errands, shopping? N  Preparing Food and eating ? N  Using the Toilet? N  In the past six months, have you accidently leaked urine? N  Do you have problems with loss of bowel control? N  Managing your Medications? N  Managing your Finances? N  Housekeeping or managing your Housekeeping? N  Some recent data might be hidden    Patient Education/ Literacy How often do you need to have someone help you when you read instructions, pamphlets, or other written materials from your doctor or pharmacy?: 1 - Never  Exercise Current Exercise Habits: The patient does not participate in regular exercise at present, Exercise limited by: orthopedic condition(s)  Diet Patient reports consuming 1 meals a day and 2 snack(s) a day Patient reports that her primary diet is:  Regular Patient reports that she does have regular access to food.   Depression Screen PHQ 2/9 Scores 08/27/2019 08/25/2018 08/14/2018 02/11/2018 12/10/2017 10/17/2017 09/20/2014  PHQ - 2 Score 0 6 3 0 4 5 0  PHQ- 9 Score - 13 15 - 13 16 -     Fall Risk Fall Risk  08/27/2019 08/25/2018 08/14/2018 02/11/2018 12/10/2017  Falls in the past year? 0 0 0 No Yes  Number falls in past yr: 0 - - - 1  Injury with Fall? 0 - - - No  Risk for fall due to : No Fall Risks - - - -  Follow up Falls evaluation completed - - - -     Objective:  Brittany Lynch seemed alert and oriented and she participated appropriately during our telephone visit.  Blood Pressure Weight BMI  BP Readings from Last 3 Encounters:  08/25/18 119/72  08/14/18 132/79  03/07/18 125/74   Wt Readings from Last 3 Encounters:  08/27/19 159 lb (72.1 kg)  08/25/18 161 lb 12.8 oz (73.4 kg)  08/14/18 161 lb 9.6 oz (73.3 kg)   BMI Readings from Last 1 Encounters:  08/27/19 29.08 kg/m    *Unable to obtain current vital signs, weight, and BMI due to telephone visit type  Hearing/Vision  Brittany Lynch did not seem to have difficulty with hearing/understanding during the telephone conversation Reports that she has not had a formal eye exam by an eye care professional within the past year Reports that she has not had a formal hearing evaluation within the past year *Unable to fully assess hearing and vision during telephone visit type  Cognitive Function: 6CIT Screen 08/27/2019  What Year? 0 points  What month? 0 points  What time? 0 points  Count back from 20 0 points  Months in reverse 0 points  Repeat phrase 0 points  Total Score 0   (Normal:0-7, Significant for Dysfunction: >8)  Normal Cognitive Function Screening: Yes   Immunization & Health Maintenance Record Immunization History  Administered Date(s) Administered   H1N1 06/01/2008   Influenza Split 04/26/2013, 04/19/2015   Influenza,inj,Quad PF,6+ Mos 05/06/2017, 08/14/2018    Influenza-Unspecified 07/17/2011   Tdap 09/02/2012    Health Maintenance  Topic Date Due   INFLUENZA VACCINE  12/04/2019 (Originally 02/14/2019)   MAMMOGRAM  09/09/2020   COLONOSCOPY  06/17/2021  TETANUS/TDAP  09/02/2022   Hepatitis C Screening  Completed   HIV Screening  Completed       Assessment  This is a routine wellness examination for Brittany Lynch.  Health Maintenance: Due or Overdue There are no preventive care reminders to display for this patient.  Brittany Lynch does not need a referral for Community Assistance: Care Management:   no Social Work:    no Prescription Assistance:  no Nutrition/Diabetes Education:  no   Plan:  Personalized Goals Goals Addressed             This Visit's Progress    Exercise 3x per week (30 min per time)       Try to exercise for at least 30 minutes, 3 times weekly       Personalized Health Maintenance & Screening Recommendations  UP to date  Lung Cancer Screening Recommended: no (Low Dose CT Chest recommended if Age 1-80 years, 30 pack-year currently smoking OR have quit w/in past 15 years) Hepatitis C Screening recommended: no HIV Screening recommended: no  Advanced Directives: Written information was not prepared per patient's request.  Referrals & Orders No orders of the defined types were placed in this encounter.   Follow-up Plan Follow-up with Sharion Balloon, FNP as planned   I have personally reviewed and noted the following in the patient's chart:   Medical and social history Use of alcohol, tobacco or illicit drugs  Current medications and supplements Functional ability and status Nutritional status Physical activity Advanced directives List of other physicians Hospitalizations, surgeries, and ER visits in previous 12 months Vitals Screenings to include cognitive, depression, and falls Referrals and appointments  In addition, I have reviewed and discussed with Evorn Gong certain  preventive protocols, quality metrics, and best practice recommendations. A written personalized care plan for preventive services as well as general preventive health recommendations is available and can be mailed to the patient at her request.      Brittany Heath, LPN  [id number]   I have reviewed and agree with the above AWV documentation.   [name], FNP

## 2019-09-03 ENCOUNTER — Other Ambulatory Visit: Payer: Self-pay | Admitting: Nurse Practitioner

## 2019-09-03 MED ORDER — FENOFIBRATE 145 MG PO TABS: 145.0000 mg | ORAL_TABLET | Freq: Every day | ORAL | 0 refills | Status: DC

## 2019-09-03 MED ORDER — OMEPRAZOLE 40 MG PO CPDR: 40.0000 mg | DELAYED_RELEASE_CAPSULE | Freq: Every day | ORAL | 3 refills | Status: DC

## 2019-09-14 ENCOUNTER — Telehealth: Payer: Self-pay | Admitting: Family

## 2019-09-14 MED ORDER — METOPROLOL TARTRATE 50 MG PO TABS: 50.0000 mg | ORAL_TABLET | Freq: Two times a day (BID) | ORAL | 0 refills | Status: DC

## 2019-09-14 NOTE — Telephone Encounter (Signed)
Rx sent- patient aware.  

## 2019-09-14 NOTE — Telephone Encounter (Signed)
@  logo@ Medication Request  09/14/2019  What is the name of the medication? metoprolol tartrate (LOPRESSOR) 50 MG tablet   Have you contacted your pharmacy to request a refill? Yes no longer using mail order   Which pharmacy would you like this sent to? [facility] in [place]   Patient notified that their request is being sent to the clinical staff for review and that they should receive a call once it is complete. If they do not receive a call within 24 hours they can check with their pharmacy or our office.

## 2019-09-23 ENCOUNTER — Other Ambulatory Visit: Payer: Self-pay

## 2019-09-24 ENCOUNTER — Ambulatory Visit (INDEPENDENT_AMBULATORY_CARE_PROVIDER_SITE_OTHER): Payer: Medicare Other | Admitting: Family

## 2019-09-24 ENCOUNTER — Encounter: Payer: Self-pay | Admitting: Family

## 2019-09-24 VITALS — BP 152/87 | HR 62 | Temp 96.0°F | Ht 62.0 in | Wt 166.4 lb

## 2019-09-24 DIAGNOSIS — E039 Hypothyroidism, unspecified: Secondary | ICD-10-CM

## 2019-09-24 DIAGNOSIS — R232 Flushing: Secondary | ICD-10-CM | POA: Diagnosis not present

## 2019-09-24 DIAGNOSIS — E785 Hyperlipidemia, unspecified: Secondary | ICD-10-CM

## 2019-09-24 DIAGNOSIS — K219 Gastro-esophageal reflux disease without esophagitis: Secondary | ICD-10-CM

## 2019-09-24 DIAGNOSIS — I10 Essential (primary) hypertension: Secondary | ICD-10-CM | POA: Diagnosis not present

## 2019-09-24 DIAGNOSIS — F331 Major depressive disorder, recurrent, moderate: Secondary | ICD-10-CM

## 2019-09-24 DIAGNOSIS — M545 Low back pain, unspecified: Secondary | ICD-10-CM

## 2019-09-24 DIAGNOSIS — R3989 Other symptoms and signs involving the genitourinary system: Secondary | ICD-10-CM | POA: Diagnosis not present

## 2019-09-24 DIAGNOSIS — R030 Elevated blood-pressure reading, without diagnosis of hypertension: Secondary | ICD-10-CM

## 2019-09-24 DIAGNOSIS — D509 Iron deficiency anemia, unspecified: Secondary | ICD-10-CM

## 2019-09-24 DIAGNOSIS — G8929 Other chronic pain: Secondary | ICD-10-CM

## 2019-09-24 DIAGNOSIS — F411 Generalized anxiety disorder: Secondary | ICD-10-CM

## 2019-09-24 LAB — MICROSCOPIC EXAMINATION
Bacteria, UA: NONE SEEN
Renal Epithel, UA: NONE SEEN /hpf
WBC, UA: NONE SEEN /hpf (ref 0–5)

## 2019-09-24 LAB — URINALYSIS, COMPLETE
Bilirubin, UA: NEGATIVE
Glucose, UA: NEGATIVE
Ketones, UA: NEGATIVE
Leukocytes,UA: NEGATIVE
Nitrite, UA: NEGATIVE
Protein,UA: NEGATIVE
Specific Gravity, UA: 1.01 (ref 1.005–1.030)
Urobilinogen, Ur: 7 mg/dL — ABNORMAL HIGH (ref 0.2–1.0)
pH, UA: 7 (ref 5.0–7.5)

## 2019-09-24 MED ORDER — ESTRADIOL 1 MG PO TABS: 1.0000 mg | ORAL_TABLET | Freq: Every day | ORAL | 1 refills | Status: DC

## 2019-09-24 MED ORDER — VENLAFAXINE HCL ER 150 MG PO CP24: ORAL_CAPSULE | ORAL | 0 refills | Status: DC

## 2019-09-24 NOTE — Progress Notes (Signed)
Subjective:    Patient ID: Brittany Lynch, female    DOB: [birth year], 59 y.o.   MRN: [id number]  Chief Complaint  Patient presents with  . Medical Management of Chronic Issues    PATIENT RAN OUT OF EFFEXOR 4 DAYS AGO   . Urinary Tract Infection  . Dizziness  . Blurred Vision  . Excessive Sweating   Pt presents to the office today for chronic follow up. She reports she feels slightly dizzy today. She has been out of her Effexor 4 days ago.   She is followed by Neurosurgeon every 3 months for chronic back pain and currently is getting injections.   She has been on Estrace for 2 months, but continues to have hot flashed. She states she can be sitting and then become dripping wet with sweat. She reports this happen every day, but has improved since starting the Estrace.  Urinary Tract Infection  This is a recurrent problem. The current episode started 1 to 4 weeks ago. The problem occurs every urination. The problem has been gradually worsening. The quality of the pain is described as burning. The pain is at a severity of 8/10. The pain is mild. There has been no fever. Associated symptoms include frequency. Pertinent negatives include no hematuria, nausea or urgency. She has tried antibiotics and increased fluids for the symptoms. The treatment provided moderate relief.  Dizziness Associated symptoms include fatigue. Pertinent negatives include no nausea.  Hypertension This is a chronic problem. The current episode started more than 1 year ago. The problem has been waxing and waning since onset. The problem is uncontrolled. Associated symptoms include anxiety and malaise/fatigue. Pertinent negatives include no peripheral edema or shortness of breath. Risk factors for coronary artery disease include dyslipidemia, obesity and sedentary lifestyle. The current treatment provides moderate improvement. There is no history of kidney disease, CAD/MI or heart failure. Identifiable causes of  hypertension include a thyroid problem.  Gastroesophageal Reflux She complains of belching and heartburn. She reports no nausea. This is a chronic problem. The current episode started more than 1 year ago. The problem occurs rarely. Associated symptoms include fatigue. Risk factors include obesity. She has tried a PPI for the symptoms. The treatment provided moderate relief.  Thyroid Problem Presents for follow-up visit. Symptoms include anxiety, depressed mood, dry skin and fatigue. Patient reports no constipation or diarrhea. The symptoms have been stable. Her past medical history is significant for hyperlipidemia. There is no history of heart failure.  Anemia Presents for follow-up visit. Symptoms include malaise/fatigue. There is no history of heart failure.  Hyperlipidemia This is a chronic problem. The current episode started more than 1 year ago. Pertinent negatives include no shortness of breath. Current antihyperlipidemic treatment includes statins. The current treatment provides moderate improvement of lipids. Risk factors for coronary artery disease include dyslipidemia, hypertension and a sedentary lifestyle.  Anxiety Presents for follow-up visit. Symptoms include depressed mood, dizziness, excessive worry, irritability, nervous/anxious behavior and restlessness. Patient reports no nausea or shortness of breath. The quality of sleep is good.   Her past medical history is significant for anemia.  Depression        This is a chronic problem.  The current episode started more than 1 year ago.   The onset quality is gradual.   The problem occurs intermittently.  The problem has been waxing and waning since onset.  Associated symptoms include fatigue, irritable and restlessness.  Associated symptoms include no helplessness and no hopelessness.  Past treatments include  SNRIs - Serotonin and norepinephrine reuptake inhibitors.  Past medical history includes thyroid problem and anxiety.        Review of Systems  Constitutional: Positive for fatigue, irritability and malaise/fatigue.  Respiratory: Negative for shortness of breath.   Gastrointestinal: Positive for heartburn. Negative for constipation, diarrhea and nausea.  Genitourinary: Positive for frequency. Negative for hematuria and urgency.  Neurological: Positive for dizziness.  Psychiatric/Behavioral: Positive for depression. The patient is nervous/anxious.   All other systems reviewed and are negative.      Objective:   Physical Exam Vitals reviewed.  Constitutional:      General: She is irritable. She is not in acute distress.    Appearance: She is well-developed.  HENT:     Head: Normocephalic and atraumatic.     Right Ear: Tympanic membrane normal.     Left Ear: Tympanic membrane normal.  Eyes:     Pupils: Pupils are equal, round, and reactive to light.  Neck:     Thyroid: No thyromegaly.  Cardiovascular:     Rate and Rhythm: Normal rate and regular rhythm.     Heart sounds: Normal heart sounds. No murmur.  Pulmonary:     Effort: Pulmonary effort is normal. No respiratory distress.     Breath sounds: Normal breath sounds. No wheezing.  Abdominal:     General: Bowel sounds are normal. There is no distension.     Palpations: Abdomen is soft.     Tenderness: There is no abdominal tenderness.  Musculoskeletal:        General: No tenderness. Normal range of motion.     Cervical back: Normal range of motion and neck supple.  Skin:    General: Skin is warm and dry.  Neurological:     Mental Status: She is alert and oriented to person, place, and time.     Cranial Nerves: No cranial nerve deficit.     Deep Tendon Reflexes: Reflexes are normal and symmetric.  Psychiatric:        Behavior: Behavior normal.        Thought Content: Thought content normal.        Judgment: Judgment normal.       BP (!) 158/90   Pulse 63   Temp (!) 96 F (35.6 C) (Temporal)   Ht _0  (1.575 m)   Wt 166 lb  6.4 oz (75.5 kg)   SpO2 97%   BMI 30.43 kg/m      Assessment & Plan:  ANNIKAH LOVINS comes in today with chief complaint of Medical Management of Chronic Issues (PATIENT RAN OUT OF EFFEXOR 4 DAYS AGO ), Urinary Tract Infection, Dizziness, Blurred Vision, and Excessive Sweating   Diagnosis and orders addressed:  1. Suspected UTI - Urinalysis, Complete - CMP14+EGFR  2. Moderate episode of recurrent major depressive disorder (HCC) - venlafaxine XR (EFFEXOR-XR) 150 MG 24 hr capsule; TAKE 1 CAPSULE BY MOUTH ONCE DAILY WITH BREAKFAST  Dispense: 90 capsule; Refill: 0 - CMP14+EGFR  3. GAD (generalized anxiety disorder) - venlafaxine XR (EFFEXOR-XR) 150 MG 24 hr capsule; TAKE 1 CAPSULE BY MOUTH ONCE DAILY WITH BREAKFAST  Dispense: 90 capsule; Refill: 0 - CMP14+EGFR  4. Hot flashes Will increase estrace to 1 mg from 0.5 mg RTO in 2 months  - estradiol (ESTRACE) 1 MG tablet; Take 1 tablet (1 mg total) by mouth daily.  Dispense: 90 tablet; Refill: 1 - CMP14+EGFR  5. Essential hypertension - CMP14+EGFR  6. Gastroesophageal reflux disease, unspecified whether esophagitis present -  CMP14+EGFR  7. Hypothyroidism, unspecified type - CMP14+EGFR  8. Chronic low back pain, unspecified back pain laterality, unspecified whether sciatica present - CMP14+EGFR  9. Iron deficiency anemia, unspecified iron deficiency anemia type - CMP14+EGFR  10. Hyperlipidemia, unspecified hyperlipidemia type - Lipid panel - CMP14+EGFR  11. Elevated blood pressure reading    Labs pending Health Maintenance reviewed Diet and exercise encouraged  Follow up plan: 2 months to recheck HTN and hot flashes   [name], FNP

## 2019-09-24 NOTE — Patient Instructions (Signed)
Menopause Menopause is the normal time of life when menstrual periods stop completely. It is usually confirmed by 12 months without a menstrual period. The transition to menopause (perimenopause) most often happens between the ages of 45 and 55. During perimenopause, hormone levels change in your body, which can cause symptoms and affect your health. Menopause may increase your risk for:  Loss of bone (osteoporosis), which causes bone breaks (fractures).  Depression.  Hardening and narrowing of the arteries (atherosclerosis), which can cause heart attacks and strokes. What are the causes? This condition is usually caused by a natural change in hormone levels that happens as you get older. The condition may also be caused by surgery to remove both ovaries (bilateral oophorectomy). What increases the risk? This condition is more likely to start at an earlier age if you have certain medical conditions or treatments, including:  A tumor of the pituitary gland in the brain.  A disease that affects the ovaries and hormone production.  Radiation treatment for cancer.  Certain cancer treatments, such as chemotherapy or hormone (anti-estrogen) therapy.  Heavy smoking and excessive alcohol use.  Family history of early menopause. This condition is also more likely to develop earlier in women who are very thin. What are the signs or symptoms? Symptoms of this condition include:  Hot flashes.  Irregular menstrual periods.  Night sweats.  Changes in feelings about sex. This could be a decrease in sex drive or an increased comfort around your sexuality.  Vaginal dryness and thinning of the vaginal walls. This may cause painful intercourse.  Dryness of the skin and development of wrinkles.  Headaches.  Problems sleeping (insomnia).  Mood swings or irritability.  Memory problems.  Weight gain.  Hair growth on the face and chest.  Bladder infections or problems with urinating. How  is this diagnosed? This condition is diagnosed based on your medical history, a physical exam, your age, your menstrual history, and your symptoms. Hormone tests may also be done. How is this treated? In some cases, no treatment is needed. You and your health care provider should make a decision together about whether treatment is necessary. Treatment will be based on your individual condition and preferences. Treatment for this condition focuses on managing symptoms. Treatment may include:  Menopausal hormone therapy (MHT).  Medicines to treat specific symptoms or complications.  Acupuncture.  Vitamin or herbal supplements. Before starting treatment, make sure to let your health care provider know if you have a personal or family history of:  Heart disease.  Breast cancer.  Blood clots.  Diabetes.  Osteoporosis. Follow these instructions at home: Lifestyle  Do not use any products that contain nicotine or tobacco, such as cigarettes and e-cigarettes. If you need help quitting, ask your health care provider.  Get at least 30 minutes of physical activity on 5 or more days each week.  Avoid alcoholic and caffeinated beverages, as well as spicy foods. This may help prevent hot flashes.  Get 7-8 hours of sleep each night.  If you have hot flashes, try: ? Dressing in layers. ? Avoiding things that may trigger hot flashes, such as spicy food, warm places, or stress. ? Taking slow, deep breaths when a hot flash starts. ? Keeping a fan in your home and office.  Find ways to manage stress, such as deep breathing, meditation, or journaling.  Consider going to group therapy with other women who are having menopause symptoms. Ask your health care provider about recommended group therapy meetings. Eating and   drinking  Eat a healthy, balanced diet that contains whole grains, lean protein, low-fat dairy, and plenty of fruits and vegetables.  Your health care provider may recommend  adding more soy to your diet. Foods that contain soy include tofu, tempeh, and soy milk.  Eat plenty of foods that contain calcium and vitamin D for bone health. Items that are rich in calcium include low-fat milk, yogurt, beans, almonds, sardines, broccoli, and kale. Medicines  Take over-the-counter and prescription medicines only as told by your health care provider.  Talk with your health care provider before starting any herbal supplements. If prescribed, take vitamins and supplements as told by your health care provider. These may include: ? Calcium. Women age 51 and older should get 1,200 mg (milligrams) of calcium every day. ? Vitamin D. Women need 600-800 International Units of vitamin D each day. ? Vitamins B12 and B6. Aim for 50 micrograms of B12 and 1.5 mg of B6 each day. General instructions  Keep track of your menstrual periods, including: ? When they occur. ? How heavy they are and how long they last. ? How much time passes between periods.  Keep track of your symptoms, noting when they start, how often you have them, and how long they last.  Use vaginal lubricants or moisturizers to help with vaginal dryness and improve comfort during sex.  Keep all follow-up visits as told by your health care provider. This is important. This includes any group therapy or counseling. Contact a health care provider if:  You are still having menstrual periods after age 55.  You have pain during sex.  You have not had a period for 12 months and you develop vaginal bleeding. Get help right away if:  You have: ? Severe depression. ? Excessive vaginal bleeding. ? Pain when you urinate. ? A fast or irregular heart beat (palpitations). ? Severe headaches. ? Abdomen (abdominal) pain or severe indigestion.  You fell and you think you have a broken bone.  You develop leg or chest pain.  You develop vision problems.  You feel a lump in your breast. Summary  Menopause is the normal  time of life when menstrual periods stop completely. It is usually confirmed by 12 months without a menstrual period.  The transition to menopause (perimenopause) most often happens between the ages of 45 and 55.  Symptoms can be managed through medicines, lifestyle changes, and complementary therapies such as acupuncture.  Eat a balanced diet that is rich in nutrients to promote bone health and heart health and to manage symptoms during menopause. This information is not intended to replace advice given to you by your health care provider. Make sure you discuss any questions you have with your health care provider. Document Revised: 06/14/2017 Document Reviewed: 08/04/2016 Elsevier Patient Education  2020 [employer]  

## 2019-09-25 LAB — LIPID PANEL
Chol/HDL Ratio: 3 ratio (ref 0.0–4.4)
Cholesterol, Total: 191 mg/dL (ref 100–199)
HDL: 63 mg/dL (ref >39)
LDL Chol Calc (NIH): 99 mg/dL (ref 0–99)
Triglycerides: 167 mg/dL — ABNORMAL HIGH (ref 0–149)
VLDL Cholesterol Cal: 29 mg/dL (ref 5–40)

## 2019-09-25 LAB — CMP14+EGFR
ALT: 32 IU/L (ref 0–32)
AST: 27 IU/L (ref 0–40)
Albumin/Globulin Ratio: 2.1 (ref 1.2–2.2)
Albumin: 4.8 g/dL (ref 3.8–4.9)
Alkaline Phosphatase: 68 IU/L (ref 39–117)
BUN/Creatinine Ratio: 17 (ref 9–23)
BUN: 13 mg/dL (ref 6–24)
Bilirubin Total: 0.3 mg/dL (ref 0.0–1.2)
CO2: 26 mmol/L (ref 20–29)
Calcium: 10 mg/dL (ref 8.7–10.2)
Chloride: 102 mmol/L (ref 96–106)
Creatinine, Ser: 0.78 mg/dL (ref 0.57–1.00)
GFR calc Af Amer: 96 mL/min/{1.73_m2} (ref >59)
GFR calc non Af Amer: 83 mL/min/{1.73_m2} (ref >59)
Globulin, Total: 2.3 g/dL (ref 1.5–4.5)
Glucose: 99 mg/dL (ref 65–99)
Potassium: 4.2 mmol/L (ref 3.5–5.2)
Sodium: 143 mmol/L (ref 134–144)
Total Protein: 7.1 g/dL (ref 6.0–8.5)

## 2019-09-29 ENCOUNTER — Telehealth: Payer: Self-pay | Admitting: Family

## 2019-09-29 NOTE — Telephone Encounter (Signed)
Please review labs and advise.

## 2019-10-01 ENCOUNTER — Ambulatory Visit: Payer: Medicare Other | Admitting: Family

## 2019-10-26 DIAGNOSIS — M48061 Spinal stenosis, lumbar region without neurogenic claudication: Secondary | ICD-10-CM | POA: Diagnosis not present

## 2019-10-26 DIAGNOSIS — M542 Cervicalgia: Secondary | ICD-10-CM | POA: Diagnosis not present

## 2019-11-12 ENCOUNTER — Other Ambulatory Visit: Payer: Self-pay | Admitting: *Deleted

## 2019-11-12 MED ORDER — LEVOTHYROXINE SODIUM 88 MCG PO TABS: 88.0000 ug | ORAL_TABLET | Freq: Every day | ORAL | 0 refills | Status: DC

## 2019-11-17 ENCOUNTER — Other Ambulatory Visit: Payer: Self-pay | Admitting: Family

## 2019-11-18 ENCOUNTER — Other Ambulatory Visit: Payer: Self-pay

## 2019-11-18 ENCOUNTER — Encounter: Payer: Self-pay | Admitting: Family

## 2019-11-18 ENCOUNTER — Ambulatory Visit (INDEPENDENT_AMBULATORY_CARE_PROVIDER_SITE_OTHER): Payer: Medicare Other | Admitting: Family

## 2019-11-18 VITALS — BP 133/81 | HR 66 | Temp 96.0°F | Ht 62.0 in | Wt 163.4 lb

## 2019-11-18 DIAGNOSIS — G4719 Other hypersomnia: Secondary | ICD-10-CM

## 2019-11-18 DIAGNOSIS — G8929 Other chronic pain: Secondary | ICD-10-CM

## 2019-11-18 DIAGNOSIS — D509 Iron deficiency anemia, unspecified: Secondary | ICD-10-CM

## 2019-11-18 DIAGNOSIS — M545 Low back pain, unspecified: Secondary | ICD-10-CM

## 2019-11-18 DIAGNOSIS — I10 Essential (primary) hypertension: Secondary | ICD-10-CM | POA: Diagnosis not present

## 2019-11-18 DIAGNOSIS — F411 Generalized anxiety disorder: Secondary | ICD-10-CM | POA: Diagnosis not present

## 2019-11-18 DIAGNOSIS — N951 Menopausal and female climacteric states: Secondary | ICD-10-CM

## 2019-11-18 DIAGNOSIS — E785 Hyperlipidemia, unspecified: Secondary | ICD-10-CM

## 2019-11-18 DIAGNOSIS — E039 Hypothyroidism, unspecified: Secondary | ICD-10-CM | POA: Diagnosis not present

## 2019-11-18 DIAGNOSIS — K219 Gastro-esophageal reflux disease without esophagitis: Secondary | ICD-10-CM | POA: Diagnosis not present

## 2019-11-18 DIAGNOSIS — F331 Major depressive disorder, recurrent, moderate: Secondary | ICD-10-CM

## 2019-11-18 DIAGNOSIS — R5383 Other fatigue: Secondary | ICD-10-CM

## 2019-11-18 MED ORDER — ATORVASTATIN CALCIUM 20 MG PO TABS: 20.0000 mg | ORAL_TABLET | Freq: Every day | ORAL | 1 refills | Status: DC

## 2019-11-18 MED ORDER — ESTRADIOL 2 MG PO TABS: 2.0000 mg | ORAL_TABLET | Freq: Every day | ORAL | 2 refills | Status: DC

## 2019-11-18 MED ORDER — FENOFIBRATE 145 MG PO TABS: 145.0000 mg | ORAL_TABLET | Freq: Every day | ORAL | 0 refills | Status: DC

## 2019-11-18 MED ORDER — BUPROPION HCL ER (SR) 150 MG PO TB12: 150.0000 mg | ORAL_TABLET | Freq: Every day | ORAL | 2 refills | Status: DC

## 2019-11-18 MED ORDER — BUSPIRONE HCL 10 MG PO TABS: 10.0000 mg | ORAL_TABLET | Freq: Three times a day (TID) | ORAL | 2 refills | Status: DC

## 2019-11-18 MED ORDER — LEVOTHYROXINE SODIUM 88 MCG PO TABS: 88.0000 ug | ORAL_TABLET | Freq: Every day | ORAL | 3 refills | Status: DC

## 2019-11-18 MED ORDER — METOPROLOL TARTRATE 50 MG PO TABS: 50.0000 mg | ORAL_TABLET | Freq: Two times a day (BID) | ORAL | 0 refills | Status: DC

## 2019-11-18 MED ORDER — VENLAFAXINE HCL ER 150 MG PO CP24: ORAL_CAPSULE | ORAL | 0 refills | Status: DC

## 2019-11-18 NOTE — Patient Instructions (Signed)
Sleep Apnea Sleep apnea is a condition in which breathing pauses or becomes shallow during sleep. Episodes of sleep apnea usually last 10 seconds or longer, and they may occur as many as 20 times an hour. Sleep apnea disrupts your sleep and keeps your body from getting the rest that it needs. This condition can increase your risk of certain health problems, including:  Heart attack.  Stroke.  Obesity.  Diabetes.  Heart failure.  Irregular heartbeat. What are the causes? There are three kinds of sleep apnea:  Obstructive sleep apnea. This kind is caused by a blocked or collapsed airway.  Central sleep apnea. This kind happens when the part of the brain that controls breathing does not send the correct signals to the muscles that control breathing.  Mixed sleep apnea. This is a combination of obstructive and central sleep apnea. The most common cause of this condition is a collapsed or blocked airway. An airway can collapse or become blocked if:  Your throat muscles are abnormally relaxed.  Your tongue and tonsils are larger than normal.  You are overweight.  Your airway is smaller than normal. What increases the risk? You are more likely to develop this condition if you:  Are overweight.  Smoke.  Have a smaller than normal airway.  Are elderly.  Are female.  Drink alcohol.  Take sedatives or tranquilizers.  Have a family history of sleep apnea. What are the signs or symptoms? Symptoms of this condition include:  Trouble staying asleep.  Daytime sleepiness and tiredness.  Irritability.  Loud snoring.  Morning headaches.  Trouble concentrating.  Forgetfulness.  Decreased interest in sex.  Unexplained sleepiness.  Mood swings.  Personality changes.  Feelings of depression.  Waking up often during the night to urinate.  Dry mouth.  Sore throat. How is this diagnosed? This condition may be diagnosed with:  A medical history.  A physical  exam.  A series of tests that are done while you are sleeping (sleep study). These tests are usually done in a sleep lab, but they may also be done at home. How is this treated? Treatment for this condition aims to restore normal breathing and to ease symptoms during sleep. It may involve managing health issues that can affect breathing, such as high blood pressure or obesity. Treatment may include:  Sleeping on your side.  Using a decongestant if you have nasal congestion.  Avoiding the use of depressants, including alcohol, sedatives, and narcotics.  Losing weight if you are overweight.  Making changes to your diet.  Quitting smoking.  Using a device to open your airway while you sleep, such as: ? An oral appliance. This is a custom-made mouthpiece that shifts your lower jaw forward. ? A continuous positive airway pressure (CPAP) device. This device blows air through a mask when you breathe out (exhale). ? A nasal expiratory positive airway pressure (EPAP) device. This device has valves that you put into each nostril. ? A bi-level positive airway pressure (BPAP) device. This device blows air through a mask when you breathe in (inhale) and breathe out (exhale).  Having surgery if other treatments do not work. During surgery, excess tissue is removed to create a wider airway. It is important to get treatment for sleep apnea. Without treatment, this condition can lead to:  High blood pressure.  Coronary artery disease.  In men, an inability to achieve or maintain an erection (impotence).  Reduced thinking abilities. Follow these instructions at home: Lifestyle  Make any lifestyle changes   that your health care provider recommends.  Eat a healthy, well-balanced diet.  Take steps to lose weight if you are overweight.  Avoid using depressants, including alcohol, sedatives, and narcotics.  Do not use any products that contain nicotine or tobacco, such as cigarettes,  e-cigarettes, and chewing tobacco. If you need help quitting, ask your health care provider. General instructions  Take over-the-counter and prescription medicines only as told by your health care provider.  If you were given a device to open your airway while you sleep, use it only as told by your health care provider.  If you are having surgery, make sure to tell your health care provider you have sleep apnea. You may need to bring your device with you.  Keep all follow-up visits as told by your health care provider. This is important. Contact a health care provider if:  The device that you received to open your airway during sleep is uncomfortable or does not seem to be working.  Your symptoms do not improve.  Your symptoms get worse. Get help right away if:  You develop: ? Chest pain. ? Shortness of breath. ? Discomfort in your back, arms, or stomach.  You have: ? Trouble speaking. ? Weakness on one side of your body. ? Drooping in your face. These symptoms may represent a serious problem that is an emergency. Do not wait to see if the symptoms will go away. Get medical help right away. Call your local emergency services (911 in the [place] Do not drive yourself to the hospital. Summary  Sleep apnea is a condition in which breathing pauses or becomes shallow during sleep.  The most common cause is a collapsed or blocked airway.  The goal of treatment is to restore normal breathing and to ease symptoms during sleep. This information is not intended to replace advice given to you by your health care provider. Make sure you discuss any questions you have with your health care provider. Document Revised: 12/17/2018 Document Reviewed: 02/25/2018 Elsevier Patient Education  2020 [employer] Inc.  

## 2019-11-18 NOTE — Progress Notes (Signed)
Subjective:    Patient ID: Brittany Lynch, female    DOB: 1960/09/10, 59 y.o.   MRN: [id number]  Chief Complaint  Patient presents with  . Hot Flashes    2 mth rck wants to increase estradiol  . Depression    wants youto start writting for wellbutrine   Pt presents to the office today for chronic follow up. She reports day time sleepiness, snoring, fatigue.   She reports her hot flashes are slightly improved since starting the estrace 1 mg daily, but continues to have hot flashes throughout the day. She reports she had a hysterectomy about 15 years ago.    She is followed by [facility] every month for chronic back pain. This stable.  Depression        This is a chronic problem.  The current episode started more than 1 year ago.   The onset quality is gradual.   The problem occurs intermittently.  Associated symptoms include fatigue, irritable, restlessness, decreased interest and sad.  Associated symptoms include no helplessness and no hopelessness.  Past medical history includes thyroid problem and anxiety.   Gastroesophageal Reflux She complains of belching and heartburn. This is a chronic problem. The current episode started more than 1 year ago. The problem occurs occasionally. The problem has been waxing and waning. Associated symptoms include fatigue. She has tried a PPI for the symptoms.  Hypertension This is a chronic problem. The current episode started more than 1 year ago. The problem has been resolved since onset. The problem is controlled. Associated symptoms include anxiety and malaise/fatigue. Pertinent negatives include no peripheral edema or shortness of breath. Risk factors for coronary artery disease include obesity. The current treatment provides moderate improvement. Identifiable causes of hypertension include a thyroid problem.  Thyroid Problem Presents for follow-up visit. Symptoms include anxiety and fatigue. Patient reports no constipation, depressed mood,  diaphoresis or diarrhea. The symptoms have been stable.  Anemia Presents for follow-up visit. Symptoms include malaise/fatigue.  Anxiety Presents for follow-up visit. Symptoms include excessive worry, irritability, nervous/anxious behavior and restlessness. Patient reports no depressed mood or shortness of breath. Symptoms occur occasionally. The severity of symptoms is moderate. The quality of sleep is good.   Her past medical history is significant for anemia.      Review of Systems  Constitutional: Positive for fatigue, irritability and malaise/fatigue. Negative for diaphoresis.  Respiratory: Negative for shortness of breath.   Gastrointestinal: Positive for heartburn. Negative for constipation and diarrhea.  Psychiatric/Behavioral: Positive for depression. The patient is nervous/anxious.   All other systems reviewed and are negative.      Objective:   Physical Exam Vitals reviewed.  Constitutional:      General: She is irritable. She is not in acute distress.    Appearance: She is well-developed.     Comments: Sweating in hair, neck, and back  HENT:     Head: Normocephalic and atraumatic.     Right Ear: Tympanic membrane normal.     Left Ear: Tympanic membrane normal.  Eyes:     Pupils: Pupils are equal, round, and reactive to light.  Neck:     Thyroid: No thyromegaly.  Cardiovascular:     Rate and Rhythm: Normal rate and regular rhythm.     Heart sounds: Normal heart sounds. No murmur.  Pulmonary:     Effort: Pulmonary effort is normal. No respiratory distress.     Breath sounds: Normal breath sounds. No wheezing.  Abdominal:  General: Bowel sounds are normal. There is no distension.     Palpations: Abdomen is soft.     Tenderness: There is no abdominal tenderness.  Musculoskeletal:        General: No tenderness. Normal range of motion.     Cervical back: Normal range of motion and neck supple.  Skin:    General: Skin is warm and dry.  Neurological:      Mental Status: She is alert and oriented to person, place, and time.     Cranial Nerves: No cranial nerve deficit.     Deep Tendon Reflexes: Reflexes are normal and symmetric.  Psychiatric:        Behavior: Behavior normal.        Thought Content: Thought content normal.        Judgment: Judgment normal.       BP 133/81   Pulse 66   Temp (!) 96 F (35.6 C) (Temporal)   Ht _0  (1.575 m)   Wt 163 lb 6.4 oz (74.1 kg)   SpO2 95%   BMI 29.89 kg/m      Assessment & Plan:  Brittany Lynch comes in today with chief complaint of Hot Flashes (2 mth rck wants to increase estradiol) and Depression (wants youto start writting for wellbutrine)   Diagnosis and orders addressed:  1. GAD (generalized anxiety disorder) - buPROPion (WELLBUTRIN SR) 150 MG 12 hr tablet; Take 1 tablet (150 mg total) by mouth daily.  Dispense: 90 tablet; Refill: 2 - busPIRone (BUSPAR) 10 MG tablet; Take 1 tablet (10 mg total) by mouth 3 (three) times daily.  Dispense: 90 tablet; Refill: 2 - venlafaxine XR (EFFEXOR-XR) 150 MG 24 hr capsule; TAKE 1 CAPSULE BY MOUTH ONCE DAILY WITH BREAKFAST  Dispense: 90 capsule; Refill: 0 - CMP14+EGFR - CBC with Differential/Platelet  2. Moderate episode of recurrent major depressive disorder (HCC) - buPROPion (WELLBUTRIN SR) 150 MG 12 hr tablet; Take 1 tablet (150 mg total) by mouth daily.  Dispense: 90 tablet; Refill: 2 - venlafaxine XR (EFFEXOR-XR) 150 MG 24 hr capsule; TAKE 1 CAPSULE BY MOUTH ONCE DAILY WITH BREAKFAST  Dispense: 90 capsule; Refill: 0 - CMP14+EGFR - CBC with Differential/Platelet  3. Essential hypertension - metoprolol tartrate (LOPRESSOR) 50 MG tablet; Take 1 tablet (50 mg total) by mouth 2 (two) times daily.  Dispense: 180 tablet; Refill: 0 - CMP14+EGFR - CBC with Differential/Platelet  4. Gastroesophageal reflux disease, unspecified whether esophagitis present - CMP14+EGFR - CBC with Differential/Platelet  5. Hypothyroidism, unspecified type -  levothyroxine (SYNTHROID) 88 MCG tablet; Take 1 tablet (88 mcg total) by mouth daily.  Dispense: 90 tablet; Refill: 3 - CMP14+EGFR - CBC with Differential/Platelet - TSH  6. Chronic low back pain, unspecified back pain laterality, unspecified whether sciatica present - CMP14+EGFR - CBC with Differential/Platelet  7. Hyperlipidemia, unspecified hyperlipidemia type - atorvastatin (LIPITOR) 20 MG tablet; Take 1 tablet (20 mg total) by mouth daily.  Dispense: 90 tablet; Refill: 1 - fenofibrate (TRICOR) 145 MG tablet; Take 1 tablet (145 mg total) by mouth daily.  Dispense: 90 tablet; Refill: 0 - CMP14+EGFR - CBC with Differential/Platelet  8. Iron deficiency anemia, unspecified iron deficiency anemia type - CMP14+EGFR - CBC with Differential/Platelet  9. Hot flash, menopausal Will increase Estrace to 2 mg from 1 mg  - estradiol (ESTRACE) 2 MG tablet; Take 1 tablet (2 mg total) by mouth daily.  Dispense: 90 tablet; Refill: 2 - CMP14+EGFR - CBC with Differential/Platelet  10. Excessive daytime sleepiness  Referral for sleep study - CMP14+EGFR - CBC with Differential/Platelet - Ambulatory referral to Pulmonology  11. Fatigue, unspecified type - CMP14+EGFR - CBC with Differential/Platelet - TSH   Labs pending Health Maintenance reviewed Diet and exercise encouraged  Follow up plan: 6 months    [name], FNP

## 2019-11-19 LAB — CBC WITH DIFFERENTIAL/PLATELET
Basophils Absolute: 0 10*3/uL (ref 0.0–0.2)
Basos: 1 %
EOS (ABSOLUTE): 0.1 10*3/uL (ref 0.0–0.4)
Eos: 2 %
Hematocrit: 37.6 % (ref 34.0–46.6)
Hemoglobin: 12.5 g/dL (ref 11.1–15.9)
Immature Grans (Abs): 0 10*3/uL (ref 0.0–0.1)
Immature Granulocytes: 0 %
Lymphocytes Absolute: 2.4 10*3/uL (ref 0.7–3.1)
Lymphs: 41 %
MCH: 28.7 pg (ref 26.6–33.0)
MCHC: 33.2 g/dL (ref 31.5–35.7)
MCV: 86 fL (ref 79–97)
Monocytes Absolute: 0.6 10*3/uL (ref 0.1–0.9)
Monocytes: 10 %
Neutrophils Absolute: 2.8 10*3/uL (ref 1.4–7.0)
Neutrophils: 46 %
Platelets: 446 10*3/uL (ref 150–450)
RBC: 4.35 x10E6/uL (ref 3.77–5.28)
RDW: 13.2 % (ref 11.7–15.4)
WBC: 5.9 10*3/uL (ref 3.4–10.8)

## 2019-11-19 LAB — CMP14+EGFR
ALT: 30 IU/L (ref 0–32)
AST: 27 IU/L (ref 0–40)
Albumin/Globulin Ratio: 2.1 (ref 1.2–2.2)
Albumin: 4.7 g/dL (ref 3.8–4.9)
Alkaline Phosphatase: 61 IU/L (ref 39–117)
BUN/Creatinine Ratio: 9 (ref 9–23)
BUN: 8 mg/dL (ref 6–24)
Bilirubin Total: 0.4 mg/dL (ref 0.0–1.2)
CO2: 25 mmol/L (ref 20–29)
Calcium: 9.6 mg/dL (ref 8.7–10.2)
Chloride: 103 mmol/L (ref 96–106)
Creatinine, Ser: 0.86 mg/dL (ref 0.57–1.00)
GFR calc Af Amer: 86 mL/min/{1.73_m2} (ref >59)
GFR calc non Af Amer: 74 mL/min/{1.73_m2} (ref >59)
Globulin, Total: 2.2 g/dL (ref 1.5–4.5)
Glucose: 92 mg/dL (ref 65–99)
Potassium: 4.1 mmol/L (ref 3.5–5.2)
Sodium: 140 mmol/L (ref 134–144)
Total Protein: 6.9 g/dL (ref 6.0–8.5)

## 2019-11-19 LAB — TSH: TSH: 0.564 u[IU]/mL (ref 0.450–4.500)

## 2019-12-04 ENCOUNTER — Institutional Professional Consult (permissible substitution): Payer: Self-pay | Admitting: Internal Medicine

## 2019-12-10 DIAGNOSIS — M48061 Spinal stenosis, lumbar region without neurogenic claudication: Secondary | ICD-10-CM | POA: Diagnosis not present

## 2020-01-12 ENCOUNTER — Institutional Professional Consult (permissible substitution): Payer: Self-pay | Admitting: Pulmonary Disease

## 2020-02-12 DIAGNOSIS — M48061 Spinal stenosis, lumbar region without neurogenic claudication: Secondary | ICD-10-CM | POA: Diagnosis not present

## 2020-02-12 DIAGNOSIS — M542 Cervicalgia: Secondary | ICD-10-CM | POA: Diagnosis not present

## 2020-02-15 ENCOUNTER — Other Ambulatory Visit: Payer: Self-pay | Admitting: Family

## 2020-02-15 DIAGNOSIS — R232 Flushing: Secondary | ICD-10-CM

## 2020-02-22 ENCOUNTER — Other Ambulatory Visit: Payer: Self-pay | Admitting: Family

## 2020-02-22 DIAGNOSIS — E785 Hyperlipidemia, unspecified: Secondary | ICD-10-CM

## 2020-02-22 DIAGNOSIS — F331 Major depressive disorder, recurrent, moderate: Secondary | ICD-10-CM

## 2020-02-22 DIAGNOSIS — I10 Essential (primary) hypertension: Secondary | ICD-10-CM

## 2020-02-22 DIAGNOSIS — F411 Generalized anxiety disorder: Secondary | ICD-10-CM

## 2020-03-17 DIAGNOSIS — M5417 Radiculopathy, lumbosacral region: Secondary | ICD-10-CM | POA: Diagnosis not present

## 2020-04-27 DIAGNOSIS — M48061 Spinal stenosis, lumbar region without neurogenic claudication: Secondary | ICD-10-CM | POA: Diagnosis not present

## 2020-04-27 DIAGNOSIS — M5417 Radiculopathy, lumbosacral region: Secondary | ICD-10-CM | POA: Diagnosis not present

## 2020-05-05 IMAGING — CR DG LUMBAR SPINE 1V
1 series · 1 of 1 positions shown · non-contrast
Comparison: MRI 10/29/2017.

CLINICAL DATA: Lumbar surgery.

EXAM:
LUMBAR SPINE - 1 VIEW

[xtable lateral]
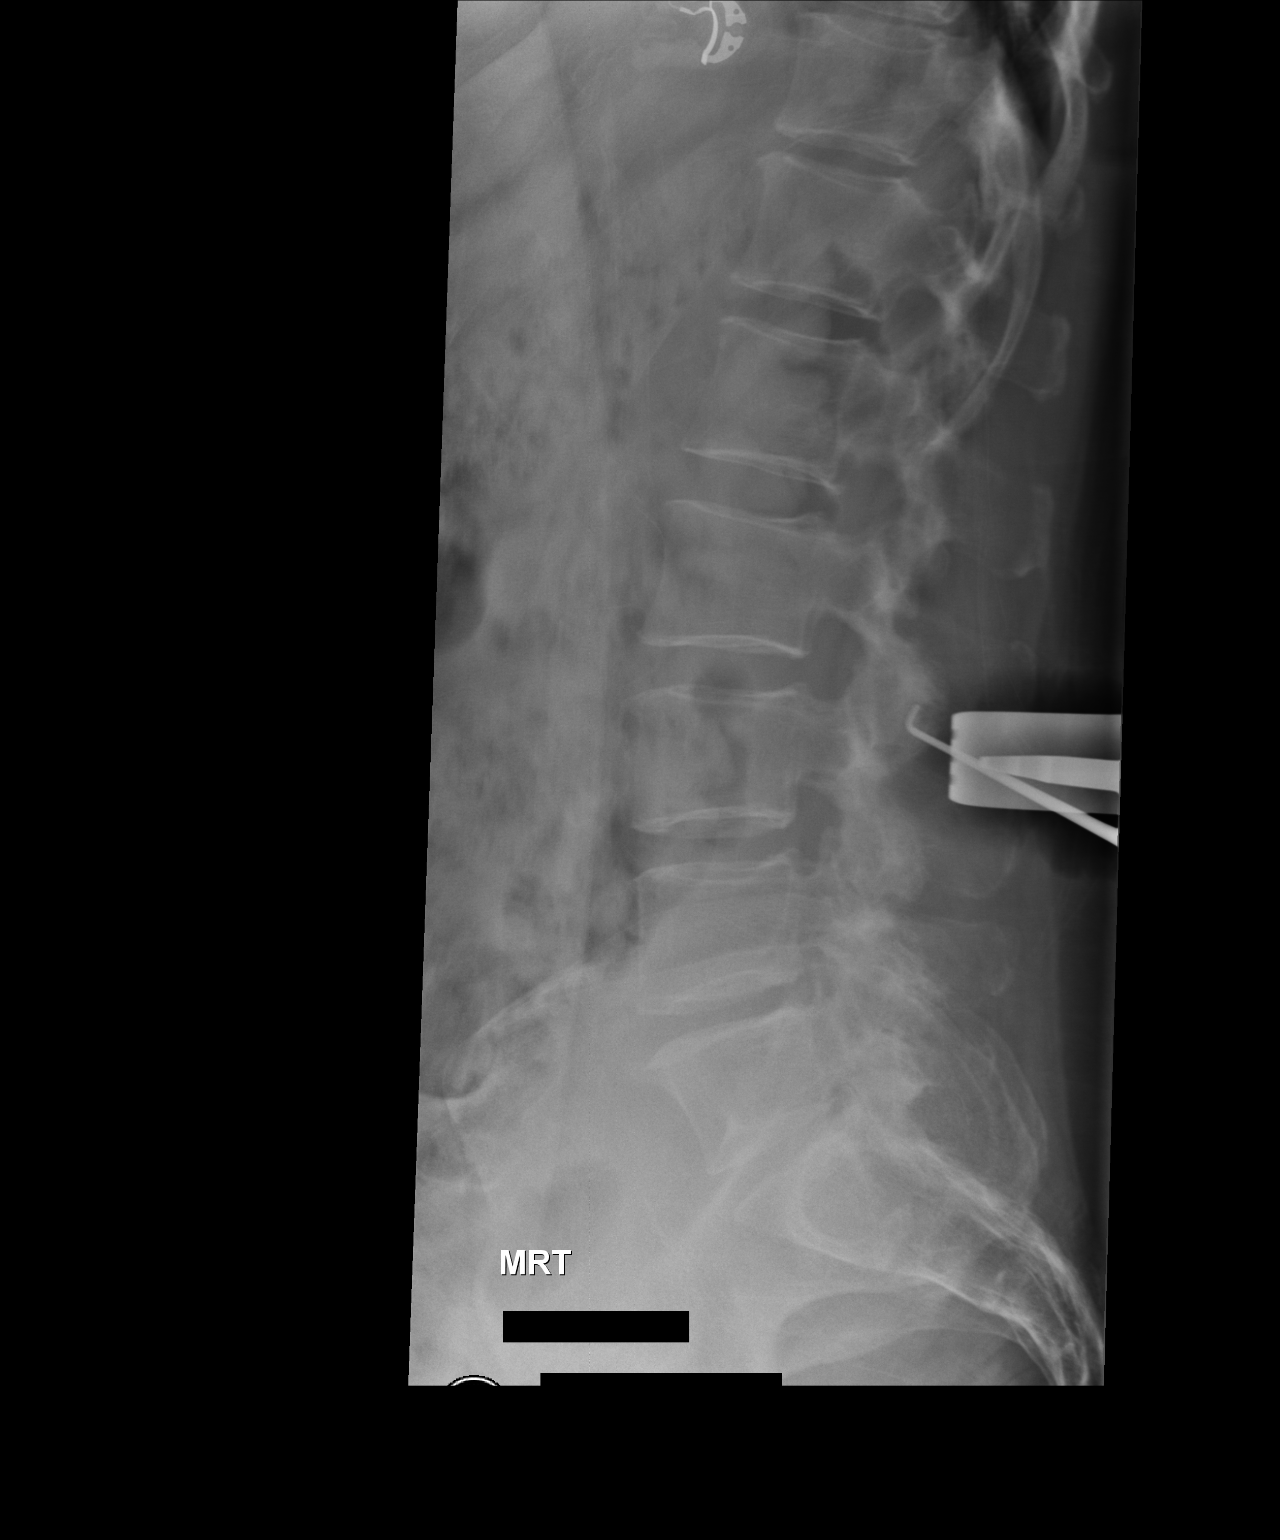

[1 of 1 positions shown; findings below may reference images not displayed]

FINDINGS: Lumbar spine numbered as per prior MRI. Metallic marker tip noted
posteriorly at the upper aspect of L3.
IMPRESSION: Metallic marker tip noted posteriorly at the upper aspect of L3.

## 2020-05-11 DIAGNOSIS — R400 Somnolence: Secondary | ICD-10-CM | POA: Diagnosis not present

## 2020-05-18 DIAGNOSIS — G4750 Parasomnia, unspecified: Secondary | ICD-10-CM | POA: Diagnosis not present

## 2020-05-18 DIAGNOSIS — G4710 Hypersomnia, unspecified: Secondary | ICD-10-CM | POA: Diagnosis not present

## 2020-05-18 DIAGNOSIS — G2581 Restless legs syndrome: Secondary | ICD-10-CM | POA: Diagnosis not present

## 2020-05-25 DIAGNOSIS — R0683 Snoring: Secondary | ICD-10-CM | POA: Diagnosis not present

## 2020-05-25 DIAGNOSIS — G4761 Periodic limb movement disorder: Secondary | ICD-10-CM | POA: Diagnosis not present

## 2020-05-25 DIAGNOSIS — G478 Other sleep disorders: Secondary | ICD-10-CM | POA: Diagnosis not present

## 2020-06-03 ENCOUNTER — Other Ambulatory Visit: Payer: Self-pay | Admitting: Family

## 2020-06-03 DIAGNOSIS — E785 Hyperlipidemia, unspecified: Secondary | ICD-10-CM

## 2020-06-03 NOTE — Telephone Encounter (Signed)
Last seen 11/2019. Patient needs follow up appointment

## 2020-06-07 ENCOUNTER — Other Ambulatory Visit: Payer: Self-pay | Admitting: Family

## 2020-06-07 DIAGNOSIS — I10 Essential (primary) hypertension: Secondary | ICD-10-CM

## 2020-06-07 DIAGNOSIS — F331 Major depressive disorder, recurrent, moderate: Secondary | ICD-10-CM

## 2020-06-07 DIAGNOSIS — F411 Generalized anxiety disorder: Secondary | ICD-10-CM

## 2020-06-14 DIAGNOSIS — G2581 Restless legs syndrome: Secondary | ICD-10-CM | POA: Diagnosis not present

## 2020-06-14 DIAGNOSIS — K219 Gastro-esophageal reflux disease without esophagitis: Secondary | ICD-10-CM | POA: Diagnosis not present

## 2020-06-14 DIAGNOSIS — I10 Essential (primary) hypertension: Secondary | ICD-10-CM | POA: Diagnosis not present

## 2020-06-14 DIAGNOSIS — G478 Other sleep disorders: Secondary | ICD-10-CM | POA: Diagnosis not present

## 2020-06-16 DIAGNOSIS — M5417 Radiculopathy, lumbosacral region: Secondary | ICD-10-CM | POA: Diagnosis not present

## 2020-07-14 DIAGNOSIS — M48061 Spinal stenosis, lumbar region without neurogenic claudication: Secondary | ICD-10-CM | POA: Diagnosis not present

## 2020-07-14 DIAGNOSIS — M542 Cervicalgia: Secondary | ICD-10-CM | POA: Diagnosis not present

## 2020-07-15 ENCOUNTER — Other Ambulatory Visit: Payer: Self-pay | Admitting: Family

## 2020-07-15 DIAGNOSIS — I10 Essential (primary) hypertension: Secondary | ICD-10-CM

## 2020-07-19 DIAGNOSIS — G478 Other sleep disorders: Secondary | ICD-10-CM | POA: Diagnosis not present

## 2020-07-19 DIAGNOSIS — I10 Essential (primary) hypertension: Secondary | ICD-10-CM | POA: Diagnosis not present

## 2020-07-19 DIAGNOSIS — G2581 Restless legs syndrome: Secondary | ICD-10-CM | POA: Diagnosis not present

## 2020-07-21 ENCOUNTER — Other Ambulatory Visit: Payer: Self-pay | Admitting: Family

## 2020-07-21 DIAGNOSIS — F411 Generalized anxiety disorder: Secondary | ICD-10-CM

## 2020-07-21 DIAGNOSIS — F331 Major depressive disorder, recurrent, moderate: Secondary | ICD-10-CM

## 2020-07-26 ENCOUNTER — Other Ambulatory Visit: Payer: Self-pay

## 2020-07-26 ENCOUNTER — Encounter: Payer: Self-pay | Admitting: Family

## 2020-07-26 ENCOUNTER — Ambulatory Visit (INDEPENDENT_AMBULATORY_CARE_PROVIDER_SITE_OTHER): Payer: Medicare Other | Admitting: Family

## 2020-07-26 VITALS — BP 123/77 | HR 64 | Temp 96.9°F | Ht 62.0 in | Wt 138.4 lb

## 2020-07-26 DIAGNOSIS — D509 Iron deficiency anemia, unspecified: Secondary | ICD-10-CM | POA: Diagnosis not present

## 2020-07-26 DIAGNOSIS — M545 Low back pain, unspecified: Secondary | ICD-10-CM

## 2020-07-26 DIAGNOSIS — E559 Vitamin D deficiency, unspecified: Secondary | ICD-10-CM

## 2020-07-26 DIAGNOSIS — K5900 Constipation, unspecified: Secondary | ICD-10-CM

## 2020-07-26 DIAGNOSIS — E039 Hypothyroidism, unspecified: Secondary | ICD-10-CM | POA: Diagnosis not present

## 2020-07-26 DIAGNOSIS — Z23 Encounter for immunization: Secondary | ICD-10-CM

## 2020-07-26 DIAGNOSIS — I10 Essential (primary) hypertension: Secondary | ICD-10-CM

## 2020-07-26 DIAGNOSIS — E785 Hyperlipidemia, unspecified: Secondary | ICD-10-CM

## 2020-07-26 DIAGNOSIS — Z9889 Other specified postprocedural states: Secondary | ICD-10-CM

## 2020-07-26 DIAGNOSIS — K219 Gastro-esophageal reflux disease without esophagitis: Secondary | ICD-10-CM | POA: Diagnosis not present

## 2020-07-26 DIAGNOSIS — G2581 Restless legs syndrome: Secondary | ICD-10-CM

## 2020-07-26 DIAGNOSIS — E781 Pure hyperglyceridemia: Secondary | ICD-10-CM

## 2020-07-26 DIAGNOSIS — F331 Major depressive disorder, recurrent, moderate: Secondary | ICD-10-CM

## 2020-07-26 DIAGNOSIS — F411 Generalized anxiety disorder: Secondary | ICD-10-CM

## 2020-07-26 DIAGNOSIS — G8929 Other chronic pain: Secondary | ICD-10-CM

## 2020-07-26 MED ORDER — PRAMIPEXOLE DIHYDROCHLORIDE 0.5 MG PO TABS
ORAL_TABLET | ORAL | 2 refills | Status: DC
Start: 2020-07-26 — End: 2021-03-29

## 2020-07-26 NOTE — Progress Notes (Signed)
Subjective:    Patient ID: Brittany Lynch, female    DOB: Jan 06, 1961, 61 y.o.   MRN: [id number]  Chief Complaint  Patient presents with  . Medical Management of Chronic Issues  . Hypertension   Pt presents to the office today for chronic follow up.   She had a sleep study that was negative for OSA, but positive for RLS. She was started on Mirapex and this has helped her day time sleepiness.   She reports her hot flashes are slightly improved since starting the estrace 1 mg daily, but continues to have hot flashes throughout the day. She reports she had a hysterectomy about 15 years ago.    She is followed by Pain Clinic every month for chronic back pain. This stable. Hypertension This is a chronic problem. The current episode started more than 1 year ago. The problem has been resolved since onset. The problem is controlled. Associated symptoms include anxiety. Pertinent negatives include no malaise/fatigue, peripheral edema or shortness of breath. Risk factors for coronary artery disease include dyslipidemia and sedentary lifestyle. The current treatment provides moderate improvement. There is no history of heart failure. Identifiable causes of hypertension include a thyroid problem.  Gastroesophageal Reflux She complains of belching and heartburn. This is a chronic problem. The current episode started more than 1 year ago. The problem occurs occasionally. The problem has been waxing and waning. Pertinent negatives include no fatigue. She has tried a PPI for the symptoms. The treatment provided moderate relief.  Thyroid Problem Presents for follow-up visit. Symptoms include constipation and dry skin. Patient reports no anxiety, depressed mood, fatigue or hair loss. The symptoms have been stable. Her past medical history is significant for hyperlipidemia. There is no history of heart failure.  Anemia Presents for follow-up visit. There has been no malaise/fatigue. There is no history of  heart failure.  Hyperlipidemia This is a chronic problem. The current episode started more than 1 year ago. Pertinent negatives include no shortness of breath. Current antihyperlipidemic treatment includes statins. The current treatment provides moderate improvement of lipids. Risk factors for coronary artery disease include dyslipidemia, hypertension and a sedentary lifestyle.  Anxiety Presents for follow-up visit. Symptoms include excessive worry, irritability, panic and restlessness. Patient reports no depressed mood, nervous/anxious behavior or shortness of breath. Symptoms occur most days.   Her past medical history is significant for anemia.  Depression        This is a chronic problem.  The current episode started more than 1 year ago.   The onset quality is gradual.   The problem occurs intermittently.  Associated symptoms include irritable, restlessness and sad.  Associated symptoms include no fatigue, no helplessness and no hopelessness.  Past medical history includes thyroid problem and anxiety.   Back Pain This is a chronic problem. The current episode started more than 1 year ago. The problem occurs intermittently. The problem has been waxing and waning since onset. The pain is at a severity of 6/10. The pain is moderate. She has tried analgesics for the symptoms.  Constipation This is a chronic problem. The current episode started more than 1 year ago. Associated symptoms include back pain. She has tried laxatives for the symptoms. The treatment provided moderate relief.      Review of Systems  Constitutional: Positive for irritability. Negative for fatigue and malaise/fatigue.  Respiratory: Negative for shortness of breath.   Gastrointestinal: Positive for constipation and heartburn.  Musculoskeletal: Positive for back pain.  Psychiatric/Behavioral: Positive for depression.  The patient is not nervous/anxious.   All other systems reviewed and are negative.      Objective:    Physical Exam Vitals reviewed.  Constitutional:      General: She is irritable. She is not in acute distress.    Appearance: She is well-developed and well-nourished.  HENT:     Head: Normocephalic and atraumatic.     Right Ear: Tympanic membrane normal.     Left Ear: Tympanic membrane normal.     Mouth/Throat:     Mouth: Oropharynx is clear and moist.  Eyes:     Pupils: Pupils are equal, round, and reactive to light.  Neck:     Thyroid: No thyromegaly.  Cardiovascular:     Rate and Rhythm: Normal rate and regular rhythm.     Pulses: Intact distal pulses.     Heart sounds: Normal heart sounds. No murmur heard.   Pulmonary:     Effort: Pulmonary effort is normal. No respiratory distress.     Breath sounds: Normal breath sounds. No wheezing.  Abdominal:     General: Bowel sounds are normal. There is no distension.     Palpations: Abdomen is soft.     Tenderness: There is no abdominal tenderness.  Musculoskeletal:        General: No tenderness or edema. Normal range of motion.     Cervical back: Normal range of motion and neck supple.  Skin:    General: Skin is warm and dry.  Neurological:     Mental Status: She is alert and oriented to person, place, and time.     Cranial Nerves: No cranial nerve deficit.     Deep Tendon Reflexes: Reflexes are normal and symmetric.  Psychiatric:        Mood and Affect: Mood and affect normal.        Behavior: Behavior normal.        Thought Content: Thought content normal.        Judgment: Judgment normal.      BP 123/77   Pulse 64   Temp (!) 96.9 F (36.1 C) (Temporal)   Ht _0  (1.575 m)   Wt 138 lb 6.4 oz (62.8 kg)   BMI 25.31 kg/m       Assessment & Plan:  Brittany Lynch comes in today with chief complaint of Medical Management of Chronic Issues and Hypertension   Diagnosis and orders addressed:  1. Primary hypertension - CMP14+EGFR - CBC with Differential/Platelet  2. Gastroesophageal reflux disease, unspecified  whether esophagitis present - CMP14+EGFR - CBC with Differential/Platelet  3. Hypothyroidism, unspecified type - CMP14+EGFR - CBC with Differential/Platelet  4. Vitamin D deficiency - CMP14+EGFR - CBC with Differential/Platelet - VITAMIN D 25 Hydroxy (Vit-D Deficiency, Fractures)  5. Iron deficiency anemia, unspecified iron deficiency anemia type - CMP14+EGFR - CBC with Differential/Platelet  6. GAD (generalized anxiety disorder) - CMP14+EGFR - CBC with Differential/Platelet  7. Chronic low back pain, unspecified back pain laterality, unspecified whether sciatica present - CMP14+EGFR - CBC with Differential/Platelet  8. Hyperlipidemia, unspecified hyperlipidemia type - CMP14+EGFR - CBC with Differential/Platelet  9. S/P lumbar laminectomy - CMP14+EGFR - CBC with Differential/Platelet  10. Hypertriglyceridemia - CMP14+EGFR - CBC with Differential/Platelet  11. Moderate episode of recurrent major depressive disorder (HCC) - CMP14+EGFR - CBC with Differential/Platelet  12. Need for immunization against influenza - Flu Vaccine QUAD 36+ mos IM  13. Constipation, unspecified constipation type  14. RLS (restless legs syndrome) - pramipexole (MIRAPEX) 0.5 MG  tablet; SMARTSIG:2 Tablet(s) By Mouth Every Evening  Dispense: 180 tablet; Refill: 2   Labs pending Health Maintenance reviewed Diet and exercise encouraged  Follow up plan: 4 months    [name], FNP

## 2020-07-26 NOTE — Patient Instructions (Signed)

## 2020-07-27 LAB — CBC WITH DIFFERENTIAL/PLATELET
Basophils Absolute: 0.1 10*3/uL (ref 0.0–0.2)
Basos: 1 %
EOS (ABSOLUTE): 0.1 10*3/uL (ref 0.0–0.4)
Eos: 2 %
Hematocrit: 34.5 % (ref 34.0–46.6)
Hemoglobin: 11.6 g/dL (ref 11.1–15.9)
Immature Grans (Abs): 0 10*3/uL (ref 0.0–0.1)
Immature Granulocytes: 0 %
Lymphocytes Absolute: 2.6 10*3/uL (ref 0.7–3.1)
Lymphs: 45 %
MCH: 28.6 pg (ref 26.6–33.0)
MCHC: 33.6 g/dL (ref 31.5–35.7)
MCV: 85 fL (ref 79–97)
Monocytes Absolute: 0.5 10*3/uL (ref 0.1–0.9)
Monocytes: 8 %
Neutrophils Absolute: 2.6 10*3/uL (ref 1.4–7.0)
Neutrophils: 44 %
Platelets: 466 10*3/uL — ABNORMAL HIGH (ref 150–450)
RBC: 4.05 x10E6/uL (ref 3.77–5.28)
RDW: 13.1 % (ref 11.7–15.4)
WBC: 6 10*3/uL (ref 3.4–10.8)

## 2020-07-27 LAB — CMP14+EGFR
ALT: 12 IU/L (ref 0–32)
AST: 17 IU/L (ref 0–40)
Albumin/Globulin Ratio: 1.9 (ref 1.2–2.2)
Albumin: 4.5 g/dL (ref 3.8–4.9)
Alkaline Phosphatase: 41 IU/L — ABNORMAL LOW (ref 44–121)
BUN/Creatinine Ratio: 11 — ABNORMAL LOW (ref 12–28)
BUN: 9 mg/dL (ref 8–27)
Bilirubin Total: 0.4 mg/dL (ref 0.0–1.2)
CO2: 26 mmol/L (ref 20–29)
Calcium: 9.4 mg/dL (ref 8.7–10.3)
Chloride: 102 mmol/L (ref 96–106)
Creatinine, Ser: 0.82 mg/dL (ref 0.57–1.00)
GFR calc Af Amer: 90 mL/min/{1.73_m2} (ref >59)
GFR calc non Af Amer: 78 mL/min/{1.73_m2} (ref >59)
Globulin, Total: 2.4 g/dL (ref 1.5–4.5)
Glucose: 87 mg/dL (ref 65–99)
Potassium: 4.5 mmol/L (ref 3.5–5.2)
Sodium: 144 mmol/L (ref 134–144)
Total Protein: 6.9 g/dL (ref 6.0–8.5)

## 2020-07-27 LAB — VITAMIN D 25 HYDROXY (VIT D DEFICIENCY, FRACTURES): Vit D, 25-Hydroxy: 34.2 ng/mL (ref 30.0–100.0)

## 2020-08-08 ENCOUNTER — Other Ambulatory Visit: Payer: Self-pay | Admitting: Family

## 2020-08-08 DIAGNOSIS — F331 Major depressive disorder, recurrent, moderate: Secondary | ICD-10-CM

## 2020-08-08 DIAGNOSIS — F411 Generalized anxiety disorder: Secondary | ICD-10-CM

## 2020-08-11 ENCOUNTER — Other Ambulatory Visit: Payer: Self-pay | Admitting: Family

## 2020-08-11 ENCOUNTER — Other Ambulatory Visit: Payer: Self-pay | Admitting: Nurse Practitioner

## 2020-08-11 DIAGNOSIS — I10 Essential (primary) hypertension: Secondary | ICD-10-CM

## 2020-08-15 ENCOUNTER — Other Ambulatory Visit: Payer: Self-pay | Admitting: Family

## 2020-08-15 DIAGNOSIS — F331 Major depressive disorder, recurrent, moderate: Secondary | ICD-10-CM

## 2020-08-15 DIAGNOSIS — F411 Generalized anxiety disorder: Secondary | ICD-10-CM

## 2020-08-16 ENCOUNTER — Other Ambulatory Visit: Payer: Self-pay | Admitting: Family

## 2020-08-16 DIAGNOSIS — E039 Hypothyroidism, unspecified: Secondary | ICD-10-CM

## 2020-08-29 ENCOUNTER — Other Ambulatory Visit: Payer: Self-pay | Admitting: Family

## 2020-08-29 DIAGNOSIS — E785 Hyperlipidemia, unspecified: Secondary | ICD-10-CM

## 2020-08-31 ENCOUNTER — Ambulatory Visit (INDEPENDENT_AMBULATORY_CARE_PROVIDER_SITE_OTHER): Payer: Medicare Other | Admitting: *Deleted

## 2020-08-31 VITALS — BP 123/77 | Ht 62.0 in | Wt 138.0 lb

## 2020-08-31 DIAGNOSIS — Z0000 Encounter for general adult medical examination without abnormal findings: Secondary | ICD-10-CM

## 2020-08-31 MED ORDER — LUBIPROSTONE 24 MCG PO CAPS: ORAL_CAPSULE | ORAL | 1 refills | Status: DC

## 2020-08-31 NOTE — Patient Instructions (Signed)
  Brittany Lynch , Thank you for taking time to come for your Medicare Wellness Visit. I appreciate your ongoing commitment to your health goals. Please review the following plan we discussed and let me know if I can assist you in the future.   These are the goals we discussed: Goals    . Exercise 3x per week (30 min per time)     Try to exercise for at least 30 minutes, 3 times weekly    . Prevent falls     Stay active        This is a list of the screening recommended for you and due dates:  Health Maintenance  Topic Date Due  . COVID-19 Vaccine (3 - Booster for Moderna series) 06/07/2020  . Mammogram  09/09/2020  . Colon Cancer Screening  06/17/2021  . Tetanus Vaccine  09/02/2022  . Flu Shot  Completed  .  Hepatitis C: One time screening is recommended by Center for Disease Control  (CDC) for  adults born from [birth year] through 1966.   Completed  . HIV Screening  Completed

## 2020-08-31 NOTE — Progress Notes (Signed)
MEDICARE ANNUAL WELLNESS VISIT  08/31/2020  Telephone Visit Disclaimer This Medicare AWV was conducted by telephone due to national recommendations for restrictions regarding the COVID-19 Pandemic (e.g. social distancing).  I verified, using two identifiers, that I am speaking with Brittany Lynch or their authorized healthcare agent. I discussed the limitations, risks, security, and privacy concerns of performing an evaluation and management service by telephone and the potential availability of an in-person appointment in the future. The patient expressed understanding and agreed to proceed.  Location of Patient: in her home  Location of Provider (nurse):  In office   Subjective:    Brittany Lynch is a 60 y.o. female patient of Hawks, Theador Hawthorne, FNP who had a Medicare Annual Wellness Visit today via telephone. Brittany Lynch is Disabled and lives with their family. she has 3 children. she reports that she is socially active and does interact with friends/family regularly. she is minimally physically active and enjoys spending time with grandchildren and pets, and playing computer games.  Patient Care Team: Sharion Balloon, FNP as PCP - General (Family Medicine) Daneil Dolin, MD as Consulting Physician (Gastroenterology)  Advanced Directives 08/31/2020 08/27/2019 08/25/2018 03/06/2018 02/27/2018 02/16/2015 01/25/2015  Does Patient Have a Medical Advance Directive? No No No No No No No  Would patient like information on creating a medical advance directive? No - Patient declined Yes (MAU/Ambulatory/Procedural Areas - Information given) Yes (ED - Information included in AVS) No - Patient declined No - Patient declined - No - patient declined information    Hospital Utilization Over the Past 12 Months: # of hospitalizations or ER visits: 0 # of surgeries: 0  Review of Systems    Patient reports that her overall health is better compared to last year.  General ROS: negative  Patient Reported  Readings (BP, Pulse, CBG, Weight, etc) BP 123/77   Ht 5\' 2"  (1.575 m)   Wt 138 lb (62.6 kg)   BMI 25.24 kg/m    Pain Assessment       Current Medications & Allergies (verified) Allergies as of 08/31/2020      Reactions   Chocolate Shortness Of Breath, Rash   Iodinated Diagnostic Agents Anaphylaxis, Shortness Of Breath, Rash   Can tolerate with Benadryl.   Methotrexate Derivatives Shortness Of Breath, Rash   Lisinopril Cough   Latex    Tape Rash, Other (See Comments)   Paper tape only.      Medication List       Accurate as of August 31, 2020 10:06 AM. If you have any questions, ask your nurse or doctor.        STOP taking these medications   gabapentin 600 MG tablet Commonly known as: NEURONTIN     TAKE these medications   atorvastatin 20 MG tablet Commonly known as: LIPITOR Take 1 tablet (20 mg total) by mouth daily.   buPROPion 150 MG 12 hr tablet Commonly known as: WELLBUTRIN SR Take 1 tablet (150 mg total) by mouth daily.   busPIRone 10 MG tablet Commonly known as: BUSPAR Take 1 tablet (10 mg total) by mouth 3 (three) times daily.   diazepam 5 MG tablet Commonly known as: VALIUM Take 5 mg by mouth as needed.   estradiol 2 MG tablet Commonly known as: ESTRACE Take 1 tablet (2 mg total) by mouth daily.   fenofibrate 145 MG tablet Commonly known as: TRICOR Take 1 tablet (145 mg total) by mouth daily. Needs to be seen for any  further refills.   levothyroxine 88 MCG tablet Commonly known as: SYNTHROID Take 1 tablet (88 mcg total) by mouth daily.   lubiprostone 24 MCG capsule Commonly known as: AMITIZA take one capsule once a day for constipation   methocarbamol 750 MG tablet Commonly known as: ROBAXIN Take 750 mg by mouth every 8 (eight) hours.   metoprolol tartrate 50 MG tablet Commonly known as: LOPRESSOR Take 1 tablet (50 mg total) by mouth 2 (two) times daily.   multivitamin with minerals Tabs tablet Take 1 tablet by mouth daily.  Women's 50+   omeprazole 40 MG capsule Commonly known as: PRILOSEC Take 1 capsule (40 mg total) by mouth daily.   oxyCODONE 5 MG immediate release tablet Commonly known as: Oxy IR/ROXICODONE Take 5-10 mg by mouth every 8 (eight) hours as needed for severe pain.   pramipexole 0.5 MG tablet Commonly known as: MIRAPEX SMARTSIG:2 Tablet(s) By Mouth Every Evening   venlafaxine XR 150 MG 24 hr capsule Commonly known as: EFFEXOR-XR TAKE 1 CAPSULE BY MOUTH ONCE DAILY WITH BREAKFAST       History (reviewed): Past Medical History:  Diagnosis Date  . Anxiety   . Depression   . Hyperlipidemia   . Hypertension   . Thyroid disease    Past Surgical History:  Procedure Laterality Date  . ABDOMINAL HYSTERECTOMY    . LUMBAR LAMINECTOMY/DECOMPRESSION MICRODISCECTOMY Bilateral 03/06/2018   Procedure: Laminectomy and Foraminotomy - Lumbar Two-Three, Three-Four bilateral;  Surgeon: Eustace Moore, MD;  Location: [facility];  Service: Neurosurgery;  Laterality: Bilateral;  Laminectomy and Foraminotomy - Lumbar Two-Three, Three-Four bilateral  . NECK SURGERY    . Right hand and wrist surgery Right    Laceration   . SPINE SURGERY     Family History  Problem Relation Age of Onset  . Mental illness Mother   . Alcohol abuse Father   . Diabetes Father   . Heart disease Father    Social History   Socioeconomic History  . Marital status: Legally Separated    Spouse name: Not on file  . Number of children: 3  . Years of education: Not on file  . Highest education level: 9th grade  Occupational History  . Occupation: Retired  Tobacco Use  . Smoking status: Former Smoker    Quit date: 07/16/2001    Years since quitting: 19.1  . Smokeless tobacco: Never Used  Vaping Use  . Vaping Use: Never used  Substance and Sexual Activity  . Alcohol use: No    Comment: hx alcoholism. none since age 51  . Drug use: No    Comment: none since age 88  . Sexual activity: Yes  Other Topics Concern  . Not on  file  Social History Narrative   Lives in apartment basement of daughters home    Social Determinants of Health   Financial Resource Strain: Not on file  Food Insecurity: Not on file  Transportation Needs: Not on file  Physical Activity: Not on file  Stress: Not on file  Social Connections: Not on file    Activities of Daily Living In your present state of health, do you have any difficulty performing the following activities: 08/31/2020  Hearing? N  Vision? Y  Comment RX glasses  Difficulty concentrating or making decisions? Y  Walking or climbing stairs? Y  Dressing or bathing? N  Doing errands, shopping? N  Preparing Food and eating ? N  Using the Toilet? N  In the past six months, have you  accidently leaked urine? N  Do you have problems with loss of bowel control? N  Managing your Medications? N  Managing your Finances? Y  Comment daughter helps  Housekeeping or managing your Housekeeping? N  Some recent data might be hidden    Patient Education/ Literacy    Exercise Current Exercise Habits: The patient does not participate in regular exercise at present, Exercise limited by: orthopedic condition(s)  Diet Patient reports consuming 1 meals a day and 1 snack(s) a day Patient reports that her primary diet is: Regular Patient reports that she does not have regular access to food.   Depression Screen PHQ 2/9 Scores 08/31/2020 11/18/2019 09/24/2019 08/27/2019 08/25/2018 08/14/2018 02/11/2018  PHQ - 2 Score 1 0 3 0 6 3 0  PHQ- 9 Score - - 14 - 13 15 -     Fall Risk Fall Risk  08/31/2020 07/26/2020 11/18/2019 09/24/2019 08/27/2019  Falls in the past year? 1 1 0 0 0  Number falls in past yr: 1 1 - - 0  Injury with Fall? 1 1 - - 0  Risk for fall due to : History of fall(s) Impaired balance/gait - - No Fall Risks  Follow up Falls evaluation completed - - - Falls evaluation completed     Objective:  Brittany Lynch seemed alert and oriented and she participated appropriately  during our telephone visit.  Blood Pressure Weight BMI  BP Readings from Last 3 Encounters:  08/31/20 123/77  07/26/20 123/77  11/18/19 133/81   Wt Readings from Last 3 Encounters:  08/31/20 138 lb (62.6 kg)  07/26/20 138 lb 6.4 oz (62.8 kg)  11/18/19 163 lb 6.4 oz (74.1 kg)   BMI Readings from Last 1 Encounters:  08/31/20 25.24 kg/m    *Unable to obtain current vital signs, weight, and BMI due to telephone visit type  Hearing/Vision  . Levora did not seem to have difficulty with hearing/understanding during the telephone conversation . Reports that she has not had a formal eye exam by an eye care professional within the past year . Reports that she has not had a formal hearing evaluation within the past year *Unable to fully assess hearing and vision during telephone visit type  Cognitive Function: 6CIT Screen 08/31/2020 08/27/2019  What Year? 0 points 0 points  What month? 0 points 0 points  What time? 0 points 0 points  Count back from 20 0 points 0 points  Months in reverse 0 points 0 points  Repeat phrase 0 points 0 points  Total Score 0 0   (Normal:0-7, Significant for Dysfunction: >8)  Normal Cognitive Function Screening: Yes   Immunization & Health Maintenance Record Immunization History  Administered Date(s) Administered  . H1N1 06/01/2008  . Influenza Split 04/26/2013, 04/19/2015  . Influenza,inj,Quad PF,6+ Mos 05/06/2017, 08/14/2018, 07/26/2020  . Influenza-Unspecified 07/17/2011  . Moderna Sars-Covid-2 Vaccination 11/08/2019, 12/06/2019  . Tdap 09/02/2012    Health Maintenance  Topic Date Due  . COVID-19 Vaccine (3 - Booster for Moderna series) 06/07/2020  . MAMMOGRAM  09/09/2020  . COLONOSCOPY (Pts 45-43yrs Insurance coverage will need to be confirmed)  06/17/2021  . TETANUS/TDAP  09/02/2022  . INFLUENZA VACCINE  Completed  . Hepatitis C Screening  Completed  . HIV Screening  Completed       Assessment  This is a routine wellness examination for  Brittany Lynch.  Health Maintenance: Due or Overdue Health Maintenance Due  Topic Date Due  . COVID-19 Vaccine (3 - Booster for [employer]  series) 06/07/2020    Brittany Lynch does not need a referral for Community Assistance: Care Management:   no Social Work:    no Prescription Assistance:  no Nutrition/Diabetes Education:  no   Plan:  Personalized Goals Goals Addressed            This Visit's Progress   . Exercise 3x per week (30 min per time)   On track    Try to exercise for at least 30 minutes, 3 times weekly    . Prevent falls       Stay active       Personalized Health Maintenance & Screening Recommendations  due for Covid booster = appt scheduled  Lung Cancer Screening Recommended: no (Low Dose CT Chest recommended if Age 56-80 years, 30 pack-year currently smoking OR have quit w/in past 15 years) Hepatitis C Screening recommended: no HIV Screening recommended: no  Advanced Directives: Written information was not prepared per patient's request. Pt aware packets available at office  Referrals & Orders No orders of the defined types were placed in this encounter.   Follow-up Plan . Follow-up with Sharion Balloon, FNP as planned    I have personally reviewed and noted the following in the patient's chart:   . Medical and social history . Use of alcohol, tobacco or illicit drugs  . Current medications and supplements . Functional ability and status . Nutritional status . Physical activity . Advanced directives . List of other physicians . Hospitalizations, surgeries, and ER visits in previous 12 months . Vitals . Screenings to include cognitive, depression, and falls . Referrals and appointments  In addition, I have reviewed and discussed with Brittany Lynch certain preventive protocols, quality metrics, and best practice recommendations. A written personalized care plan for preventive services as well as general preventive health recommendations  is available and can be mailed to the patient at her request.      [place] Dec  08/31/2020

## 2020-09-01 DIAGNOSIS — M5412 Radiculopathy, cervical region: Secondary | ICD-10-CM | POA: Diagnosis not present

## 2020-09-03 ENCOUNTER — Other Ambulatory Visit: Payer: Self-pay | Admitting: Family

## 2020-09-03 DIAGNOSIS — E785 Hyperlipidemia, unspecified: Secondary | ICD-10-CM

## 2020-09-05 ENCOUNTER — Ambulatory Visit: Payer: Self-pay

## 2020-09-08 ENCOUNTER — Other Ambulatory Visit: Payer: Self-pay | Admitting: Family

## 2020-09-08 DIAGNOSIS — N951 Menopausal and female climacteric states: Secondary | ICD-10-CM

## 2020-09-12 ENCOUNTER — Ambulatory Visit: Payer: Self-pay

## 2020-09-14 ENCOUNTER — Ambulatory Visit (INDEPENDENT_AMBULATORY_CARE_PROVIDER_SITE_OTHER): Payer: Medicare Other

## 2020-09-14 ENCOUNTER — Other Ambulatory Visit: Payer: Self-pay

## 2020-09-14 DIAGNOSIS — Z23 Encounter for immunization: Secondary | ICD-10-CM

## 2020-10-07 DIAGNOSIS — Z981 Arthrodesis status: Secondary | ICD-10-CM | POA: Diagnosis not present

## 2020-10-07 DIAGNOSIS — M48061 Spinal stenosis, lumbar region without neurogenic claudication: Secondary | ICD-10-CM | POA: Diagnosis not present

## 2020-10-07 DIAGNOSIS — M542 Cervicalgia: Secondary | ICD-10-CM | POA: Diagnosis not present

## 2020-10-31 ENCOUNTER — Other Ambulatory Visit: Payer: Self-pay | Admitting: Family

## 2020-10-31 DIAGNOSIS — F411 Generalized anxiety disorder: Secondary | ICD-10-CM

## 2020-10-31 NOTE — Telephone Encounter (Signed)
Last office visit 07/26/20 Last refill 11/18/19, #90, 2 refills

## 2020-11-05 ENCOUNTER — Other Ambulatory Visit: Payer: Self-pay | Admitting: Family

## 2020-11-05 DIAGNOSIS — F411 Generalized anxiety disorder: Secondary | ICD-10-CM

## 2020-11-05 DIAGNOSIS — F331 Major depressive disorder, recurrent, moderate: Secondary | ICD-10-CM

## 2020-11-10 DIAGNOSIS — Z7689 Persons encountering health services in other specified circumstances: Secondary | ICD-10-CM | POA: Diagnosis not present

## 2020-11-10 DIAGNOSIS — Z981 Arthrodesis status: Secondary | ICD-10-CM | POA: Diagnosis not present

## 2020-11-15 ENCOUNTER — Encounter: Payer: Self-pay | Admitting: Family Medicine

## 2020-11-17 ENCOUNTER — Ambulatory Visit: Payer: Medicare Other | Admitting: Family

## 2020-11-17 ENCOUNTER — Encounter: Payer: Self-pay | Admitting: Family

## 2020-11-17 ENCOUNTER — Other Ambulatory Visit: Payer: Self-pay

## 2020-11-17 ENCOUNTER — Ambulatory Visit (INDEPENDENT_AMBULATORY_CARE_PROVIDER_SITE_OTHER): Payer: Medicare Other | Admitting: Family

## 2020-11-17 VITALS — BP 127/82 | HR 64 | Temp 97.7°F | Ht 62.0 in | Wt 138.8 lb

## 2020-11-17 DIAGNOSIS — G8929 Other chronic pain: Secondary | ICD-10-CM

## 2020-11-17 DIAGNOSIS — J019 Acute sinusitis, unspecified: Secondary | ICD-10-CM | POA: Diagnosis not present

## 2020-11-17 DIAGNOSIS — M545 Low back pain, unspecified: Secondary | ICD-10-CM

## 2020-11-17 DIAGNOSIS — I10 Essential (primary) hypertension: Secondary | ICD-10-CM | POA: Diagnosis not present

## 2020-11-17 DIAGNOSIS — E785 Hyperlipidemia, unspecified: Secondary | ICD-10-CM

## 2020-11-17 DIAGNOSIS — K5900 Constipation, unspecified: Secondary | ICD-10-CM | POA: Diagnosis not present

## 2020-11-17 DIAGNOSIS — D509 Iron deficiency anemia, unspecified: Secondary | ICD-10-CM

## 2020-11-17 DIAGNOSIS — E039 Hypothyroidism, unspecified: Secondary | ICD-10-CM | POA: Diagnosis not present

## 2020-11-17 DIAGNOSIS — K219 Gastro-esophageal reflux disease without esophagitis: Secondary | ICD-10-CM

## 2020-11-17 DIAGNOSIS — M4802 Spinal stenosis, cervical region: Secondary | ICD-10-CM | POA: Diagnosis not present

## 2020-11-17 DIAGNOSIS — M542 Cervicalgia: Secondary | ICD-10-CM | POA: Diagnosis not present

## 2020-11-17 DIAGNOSIS — F411 Generalized anxiety disorder: Secondary | ICD-10-CM

## 2020-11-17 DIAGNOSIS — M5412 Radiculopathy, cervical region: Secondary | ICD-10-CM | POA: Diagnosis not present

## 2020-11-17 DIAGNOSIS — Z981 Arthrodesis status: Secondary | ICD-10-CM | POA: Diagnosis not present

## 2020-11-17 DIAGNOSIS — F331 Major depressive disorder, recurrent, moderate: Secondary | ICD-10-CM

## 2020-11-17 MED ORDER — AMOXICILLIN-POT CLAVULANATE 875-125 MG PO TABS: 1.0000 | ORAL_TABLET | Freq: Two times a day (BID) | ORAL | 0 refills | Status: DC

## 2020-11-17 NOTE — Progress Notes (Signed)
Subjective:    Patient ID: Brittany Lynch, female    DOB: Sep 26, 1960, 60 y.o.   MRN: [id number]  Chief Complaint  Patient presents with  . Follow-up    4 month      Pt presents to the office today for chronic follow up. She is followed by Neurosurgeon and pain management every month.  Hypertension This is a chronic problem. The current episode started more than 1 year ago. The problem has been resolved since onset. The problem is controlled. Associated symptoms include anxiety. Pertinent negatives include no headaches, malaise/fatigue, peripheral edema or shortness of breath. Risk factors for coronary artery disease include sedentary lifestyle. The current treatment provides moderate improvement. Identifiable causes of hypertension include a thyroid problem.  Gastroesophageal Reflux She complains of belching and heartburn. She reports no coughing, no hoarse voice or no sore throat. This is a chronic problem. The current episode started more than 1 year ago. The problem occurs rarely. Pertinent negatives include no fatigue. She has tried an antacid for the symptoms. The treatment provided mild relief.  Thyroid Problem Presents for follow-up visit. Symptoms include anxiety, constipation and depressed mood. Patient reports no dry skin, fatigue or hoarse voice. The symptoms have been stable. Her past medical history is significant for hyperlipidemia.  Anemia Presents for follow-up visit. There has been no malaise/fatigue.  Hyperlipidemia This is a chronic problem. The current episode started more than 1 year ago. Pertinent negatives include no shortness of breath. Current antihyperlipidemic treatment includes fibric acid derivatives and statins. The current treatment provides moderate improvement of lipids. Risk factors for coronary artery disease include dyslipidemia and hypertension.  Anxiety Presents for follow-up visit. Symptoms include depressed mood, excessive worry, irritability,  nervous/anxious behavior and restlessness. Patient reports no shortness of breath. Symptoms occur most days.   Her past medical history is significant for anemia.  Depression        This is a chronic problem.  The problem occurs intermittently.  Associated symptoms include irritable, restlessness, decreased interest and sad.  Associated symptoms include no fatigue, no helplessness, no hopelessness and no headaches.  Past medical history includes thyroid problem and anxiety.   Constipation This is a chronic problem. The current episode started more than 1 year ago. Her stool frequency is 4 to 5 times per week. Associated symptoms include back pain. She has tried laxatives for the symptoms. The treatment provided moderate relief.  Back Pain This is a chronic problem. The current episode started more than 1 year ago. The problem occurs intermittently. The problem has been waxing and waning since onset. The pain is present in the lumbar spine. The quality of the pain is described as aching. The pain is at a severity of 7/10. The pain is moderate. Pertinent negatives include no headaches.  Sinusitis This is a new problem. The current episode started 1 to 4 weeks ago. The problem has been gradually worsening since onset. There has been no fever. Her pain is at a severity of 1/10. The pain is mild. Associated symptoms include congestion and sinus pressure. Pertinent negatives include no coughing, ear pain, headaches, hoarse voice, shortness of breath or sore throat. (Foul taste and smell ) Past treatments include acetaminophen and oral decongestants (sinus massage). The treatment provided mild relief.      Review of Systems  Constitutional: Positive for irritability. Negative for fatigue and malaise/fatigue.  HENT: Positive for congestion and sinus pressure. Negative for ear pain, hoarse voice and sore throat.   Respiratory: Negative  for cough and shortness of breath.   Gastrointestinal: Positive for  constipation and heartburn.  Musculoskeletal: Positive for back pain.  Neurological: Negative for headaches.  Psychiatric/Behavioral: Positive for depression. The patient is nervous/anxious.   All other systems reviewed and are negative.      Objective:   Physical Exam Vitals reviewed.  Constitutional:      General: She is irritable. She is not in acute distress.    Appearance: She is well-developed.  HENT:     Head: Normocephalic and atraumatic.     Right Ear: Tympanic membrane normal.     Left Ear: Tympanic membrane normal.     Nose:     Right Turbinates: Swollen.     Left Turbinates: Swollen.  Eyes:     Pupils: Pupils are equal, round, and reactive to light.  Neck:     Thyroid: No thyromegaly.  Cardiovascular:     Rate and Rhythm: Normal rate and regular rhythm.     Heart sounds: Normal heart sounds. No murmur heard.   Pulmonary:     Effort: Pulmonary effort is normal. No respiratory distress.     Breath sounds: Normal breath sounds. No wheezing.  Abdominal:     General: Bowel sounds are normal. There is no distension.     Palpations: Abdomen is soft.     Tenderness: There is no abdominal tenderness.  Musculoskeletal:        General: No tenderness.     Cervical back: Normal range of motion and neck supple.     Comments: Pain in lumbar with flexion and extension  Skin:    General: Skin is warm and dry.  Neurological:     Mental Status: She is alert and oriented to person, place, and time.     Cranial Nerves: No cranial nerve deficit.     Deep Tendon Reflexes: Reflexes are normal and symmetric.  Psychiatric:        Behavior: Behavior normal.        Thought Content: Thought content normal.        Judgment: Judgment normal.       BP 127/82   Pulse 64   Temp 97.7 F (36.5 C)   Ht $R'5\' 2"'VK$  (1.575 m)   Wt 138 lb 12.8 oz (63 kg)   SpO2 100%   BMI 25.39 kg/m      Assessment & Plan:  Brittany Lynch comes in today with chief complaint of Follow-up (4  month)   Diagnosis and orders addressed:  1. Primary hypertension - CMP14+EGFR - CBC with Differential/Platelet  2. Gastroesophageal reflux disease, unspecified whether esophagitis present - CMP14+EGFR - CBC with Differential/Platelet  3. Hypothyroidism, unspecified type - CMP14+EGFR - CBC with Differential/Platelet - TSH  4. Chronic low back pain, unspecified back pain laterality, unspecified whether sciatica present - CMP14+EGFR - CBC with Differential/Platelet  5. Hyperlipidemia, unspecified hyperlipidemia type - CMP14+EGFR - CBC with Differential/Platelet  6. GAD (generalized anxiety disorder) - CMP14+EGFR - CBC with Differential/Platelet  7. Moderate episode of recurrent major depressive disorder (HCC) - CMP14+EGFR - CBC with Differential/Platelet  8. Constipation, unspecified constipation type - CMP14+EGFR - CBC with Differential/Platelet  9. Iron deficiency anemia, unspecified iron deficiency anemia type - CMP14+EGFR - CBC with Differential/Platelet  10. Acute sinusitis, recurrence not specified, unspecified location - Take meds as prescribed - Use a cool mist humidifier  -Use saline nose sprays frequently -Force fluids -For any cough or congestion  Use plain Mucinex- regular strength or max strength is  fine -For fever or aces or pains- take tylenol or ibuprofen. -Throat lozenges if help -RTO if symptoms worsen or do not improve  - CMP14+EGFR - CBC with Differential/Platelet - amoxicillin-clavulanate (AUGMENTIN) 875-125 MG tablet; Take 1 tablet by mouth 2 (two) times daily.  Dispense: 14 tablet; Refill: 0   Labs pending Health Maintenance reviewed Diet and exercise encouraged  Follow up plan: 6 months    [name], FNP

## 2020-11-17 NOTE — Patient Instructions (Signed)
Sinusitis, Adult Sinusitis is inflammation of your sinuses. Sinuses are hollow spaces in the bones around your face. Your sinuses are located:  Around your eyes.  In the middle of your forehead.  Behind your nose.  In your cheekbones. Mucus normally drains out of your sinuses. When your nasal tissues become inflamed or swollen, mucus can become trapped or blocked. This allows bacteria, viruses, and fungi to grow, which leads to infection. Most infections of the sinuses are caused by a virus. Sinusitis can develop quickly. It can last for up to 4 weeks (acute) or for more than 12 weeks (chronic). Sinusitis often develops after a cold. What are the causes? This condition is caused by anything that creates swelling in the sinuses or stops mucus from draining. This includes:  Allergies.  Asthma.  Infection from bacteria or viruses.  Deformities or blockages in your nose or sinuses.  Abnormal growths in the nose (nasal polyps).  Pollutants, such as chemicals or irritants in the air.  Infection from fungi (rare). What increases the risk? You are more likely to develop this condition if you:  Have a weak body defense system (immune system).  Do a lot of swimming or diving.  Overuse nasal sprays.  Smoke. What are the signs or symptoms? The main symptoms of this condition are pain and a feeling of pressure around the affected sinuses. Other symptoms include:  Stuffy nose or congestion.  Thick drainage from your nose.  Swelling and warmth over the affected sinuses.  Headache.  Upper toothache.  A cough that may get worse at night.  Extra mucus that collects in the throat or the back of the nose (postnasal drip).  Decreased sense of smell and taste.  Fatigue.  A fever.  Sore throat.  Bad breath. How is this diagnosed? This condition is diagnosed based on:  Your symptoms.  Your medical history.  A physical exam.  Tests to find out if your condition is  acute or chronic. This may include: ? Checking your nose for nasal polyps. ? Viewing your sinuses using a device that has a light (endoscope). ? Testing for allergies or bacteria. ? Imaging tests, such as an MRI or CT scan. In rare cases, a bone biopsy may be done to rule out more serious types of fungal sinus disease. How is this treated? Treatment for sinusitis depends on the cause and whether your condition is chronic or acute.  If caused by a virus, your symptoms should go away on their own within 10 days. You may be given medicines to relieve symptoms. They include: ? Medicines that shrink swollen nasal passages (topical intranasal decongestants). ? Medicines that treat allergies (antihistamines). ? A spray that eases inflammation of the nostrils (topical intranasal corticosteroids). ? Rinses that help get rid of thick mucus in your nose (nasal saline washes).  If caused by bacteria, your health care provider may recommend waiting to see if your symptoms improve. Most bacterial infections will get better without antibiotic medicine. You may be given antibiotics if you have: ? A severe infection. ? A weak immune system.  If caused by narrow nasal passages or nasal polyps, you may need to have surgery. Follow these instructions at home: Medicines  Take, use, or apply over-the-counter and prescription medicines only as told by your health care provider. These may include nasal sprays.  If you were prescribed an antibiotic medicine, take it as told by your health care provider. Do not stop taking the antibiotic even if you start   to feel better. Hydrate and humidify  Drink enough fluid to keep your urine pale yellow. Staying hydrated will help to thin your mucus.  Use a cool mist humidifier to keep the humidity level in your home above 50%.  Inhale steam for 10-15 minutes, 3-4 times a day, or as told by your health care provider. You can do this in the bathroom while a hot shower is  running.  Limit your exposure to cool or dry air.   Rest  Rest as much as possible.  Sleep with your head raised (elevated).  Make sure you get enough sleep each night. General instructions  Apply a warm, moist washcloth to your face 3-4 times a day or as told by your health care provider. This will help with discomfort.  Wash your hands often with soap and water to reduce your exposure to germs. If soap and water are not available, use hand sanitizer.  Do not smoke. Avoid being around people who are smoking (secondhand smoke).  Keep all follow-up visits as told by your health care provider. This is important.   Contact a health care provider if:  You have a fever.  Your symptoms get worse.  Your symptoms do not improve within 10 days. Get help right away if:  You have a severe headache.  You have persistent vomiting.  You have severe pain or swelling around your face or eyes.  You have vision problems.  You develop confusion.  Your neck is stiff.  You have trouble breathing. Summary  Sinusitis is soreness and inflammation of your sinuses. Sinuses are hollow spaces in the bones around your face.  This condition is caused by nasal tissues that become inflamed or swollen. The swelling traps or blocks the flow of mucus. This allows bacteria, viruses, and fungi to grow, which leads to infection.  If you were prescribed an antibiotic medicine, take it as told by your health care provider. Do not stop taking the antibiotic even if you start to feel better.  Keep all follow-up visits as told by your health care provider. This is important. This information is not intended to replace advice given to you by your health care provider. Make sure you discuss any questions you have with your health care provider. Document Revised: 12/02/2017 Document Reviewed: 12/02/2017 Elsevier Patient Education  2021 [employer]  

## 2020-11-18 ENCOUNTER — Other Ambulatory Visit: Payer: Self-pay | Admitting: Family

## 2020-11-18 DIAGNOSIS — D75839 Thrombocytosis, unspecified: Secondary | ICD-10-CM

## 2020-11-18 LAB — CBC WITH DIFFERENTIAL/PLATELET
Basophils Absolute: 0.1 10*3/uL (ref 0.0–0.2)
Basos: 1 %
EOS (ABSOLUTE): 0.2 10*3/uL (ref 0.0–0.4)
Eos: 3 %
Hematocrit: 35.3 % (ref 34.0–46.6)
Hemoglobin: 12.1 g/dL (ref 11.1–15.9)
Immature Grans (Abs): 0 10*3/uL (ref 0.0–0.1)
Immature Granulocytes: 0 %
Lymphocytes Absolute: 2.6 10*3/uL (ref 0.7–3.1)
Lymphs: 44 %
MCH: 29.2 pg (ref 26.6–33.0)
MCHC: 34.3 g/dL (ref 31.5–35.7)
MCV: 85 fL (ref 79–97)
Monocytes Absolute: 0.5 10*3/uL (ref 0.1–0.9)
Monocytes: 9 %
Neutrophils Absolute: 2.5 10*3/uL (ref 1.4–7.0)
Neutrophils: 43 %
Platelets: 536 10*3/uL — ABNORMAL HIGH (ref 150–450)
RBC: 4.14 x10E6/uL (ref 3.77–5.28)
RDW: 12.7 % (ref 11.7–15.4)
WBC: 5.9 10*3/uL (ref 3.4–10.8)

## 2020-11-18 LAB — CMP14+EGFR
ALT: 10 IU/L (ref 0–32)
AST: 19 IU/L (ref 0–40)
Albumin/Globulin Ratio: 1.6 (ref 1.2–2.2)
Albumin: 4.5 g/dL (ref 3.8–4.9)
Alkaline Phosphatase: 59 IU/L (ref 44–121)
BUN/Creatinine Ratio: 17 (ref 12–28)
BUN: 14 mg/dL (ref 8–27)
Bilirubin Total: 0.3 mg/dL (ref 0.0–1.2)
CO2: 23 mmol/L (ref 20–29)
Calcium: 9.8 mg/dL (ref 8.7–10.3)
Chloride: 100 mmol/L (ref 96–106)
Creatinine, Ser: 0.83 mg/dL (ref 0.57–1.00)
Globulin, Total: 2.9 g/dL (ref 1.5–4.5)
Glucose: 91 mg/dL (ref 65–99)
Potassium: 4.7 mmol/L (ref 3.5–5.2)
Sodium: 140 mmol/L (ref 134–144)
Total Protein: 7.4 g/dL (ref 6.0–8.5)
eGFR: 81 mL/min/{1.73_m2} (ref >59)

## 2020-11-18 LAB — TSH: TSH: 5.24 u[IU]/mL — ABNORMAL HIGH (ref 0.450–4.500)

## 2020-11-18 MED ORDER — LEVOTHYROXINE SODIUM 100 MCG PO TABS: 100.0000 ug | ORAL_TABLET | Freq: Every day | ORAL | 1 refills | Status: DC

## 2020-11-21 ENCOUNTER — Other Ambulatory Visit: Payer: Self-pay | Admitting: Family

## 2020-11-21 DIAGNOSIS — D75839 Thrombocytosis, unspecified: Secondary | ICD-10-CM

## 2020-11-28 ENCOUNTER — Other Ambulatory Visit: Payer: Self-pay | Admitting: Family

## 2020-12-02 ENCOUNTER — Other Ambulatory Visit: Payer: Self-pay | Admitting: Family

## 2020-12-02 DIAGNOSIS — E785 Hyperlipidemia, unspecified: Secondary | ICD-10-CM

## 2020-12-09 ENCOUNTER — Inpatient Hospital Stay (HOSPITAL_COMMUNITY): Payer: Medicare Other | Attending: Hematology and Oncology | Admitting: Hematology and Oncology

## 2020-12-09 ENCOUNTER — Inpatient Hospital Stay (HOSPITAL_COMMUNITY): Payer: Medicare Other

## 2020-12-09 ENCOUNTER — Other Ambulatory Visit: Payer: Self-pay

## 2020-12-09 VITALS — BP 143/72 | Temp 98.0°F | Resp 16 | Wt 143.7 lb

## 2020-12-09 DIAGNOSIS — R61 Generalized hyperhidrosis: Secondary | ICD-10-CM | POA: Insufficient documentation

## 2020-12-09 DIAGNOSIS — I10 Essential (primary) hypertension: Secondary | ICD-10-CM | POA: Diagnosis not present

## 2020-12-09 DIAGNOSIS — Z87891 Personal history of nicotine dependence: Secondary | ICD-10-CM | POA: Insufficient documentation

## 2020-12-09 DIAGNOSIS — Z79899 Other long term (current) drug therapy: Secondary | ICD-10-CM | POA: Insufficient documentation

## 2020-12-09 DIAGNOSIS — M79606 Pain in leg, unspecified: Secondary | ICD-10-CM | POA: Diagnosis not present

## 2020-12-09 DIAGNOSIS — R5383 Other fatigue: Secondary | ICD-10-CM | POA: Insufficient documentation

## 2020-12-09 DIAGNOSIS — M25519 Pain in unspecified shoulder: Secondary | ICD-10-CM | POA: Diagnosis not present

## 2020-12-09 DIAGNOSIS — D75839 Thrombocytosis, unspecified: Secondary | ICD-10-CM | POA: Diagnosis not present

## 2020-12-09 DIAGNOSIS — E079 Disorder of thyroid, unspecified: Secondary | ICD-10-CM | POA: Diagnosis not present

## 2020-12-09 DIAGNOSIS — E785 Hyperlipidemia, unspecified: Secondary | ICD-10-CM | POA: Diagnosis not present

## 2020-12-09 LAB — CBC WITH DIFFERENTIAL/PLATELET
Abs Immature Granulocytes: 0 10*3/uL (ref 0.00–0.07)
Basophils Absolute: 0.1 10*3/uL (ref 0.0–0.1)
Basophils Relative: 1 %
Eosinophils Absolute: 0.2 10*3/uL (ref 0.0–0.5)
Eosinophils Relative: 4 %
HCT: 35.9 % — ABNORMAL LOW (ref 36.0–46.0)
Hemoglobin: 11.8 g/dL — ABNORMAL LOW (ref 12.0–15.0)
Immature Granulocytes: 0 %
Lymphocytes Relative: 48 %
Lymphs Abs: 2.8 10*3/uL (ref 0.7–4.0)
MCH: 29.7 pg (ref 26.0–34.0)
MCHC: 32.9 g/dL (ref 30.0–36.0)
MCV: 90.4 fL (ref 80.0–100.0)
Monocytes Absolute: 0.6 10*3/uL (ref 0.1–1.0)
Monocytes Relative: 10 %
Neutro Abs: 2.1 10*3/uL (ref 1.7–7.7)
Neutrophils Relative %: 37 %
Platelets: 394 10*3/uL (ref 150–400)
RBC: 3.97 MIL/uL (ref 3.87–5.11)
RDW: 14 % (ref 11.5–15.5)
WBC: 5.8 10*3/uL (ref 4.0–10.5)
nRBC: 0 % (ref 0.0–0.2)

## 2020-12-09 LAB — COMPREHENSIVE METABOLIC PANEL
ALT: 16 U/L (ref 0–44)
AST: 23 U/L (ref 15–41)
Albumin: 4 g/dL (ref 3.5–5.0)
Alkaline Phosphatase: 41 U/L (ref 38–126)
Anion gap: 5 (ref 5–15)
BUN: 11 mg/dL (ref 6–20)
CO2: 27 mmol/L (ref 22–32)
Calcium: 8.1 mg/dL — ABNORMAL LOW (ref 8.9–10.3)
Chloride: 103 mmol/L (ref 98–111)
Creatinine, Ser: 0.79 mg/dL (ref 0.44–1.00)
GFR, Estimated: >60 mL/min (ref >60)
Glucose, Bld: 112 mg/dL — ABNORMAL HIGH (ref 70–99)
Potassium: 3.9 mmol/L (ref 3.5–5.1)
Sodium: 135 mmol/L (ref 135–145)
Total Bilirubin: 0.4 mg/dL (ref 0.3–1.2)
Total Protein: 6.9 g/dL (ref 6.5–8.1)

## 2020-12-09 LAB — FERRITIN: Ferritin: 12 ng/mL (ref 11–307)

## 2020-12-09 LAB — IRON AND TIBC
Iron: 48 ug/dL (ref 28–170)
Saturation Ratios: 9 % — ABNORMAL LOW (ref 10.4–31.8)
TIBC: 524 ug/dL — ABNORMAL HIGH (ref 250–450)
UIBC: 476 ug/dL

## 2020-12-09 LAB — C-REACTIVE PROTEIN: CRP: 0.6 mg/dL (ref <1.0)

## 2020-12-09 NOTE — Progress Notes (Signed)
[facility] CONSULT NOTE  Patient Care Team: Sharion Balloon, FNP as PCP - General (Family Medicine) Gala Romney, Cristopher Estimable, MD as Consulting Physician (Gastroenterology)  CHIEF COMPLAINTS/PURPOSE OF CONSULTATION:  Thrombocytosis.  ASSESSMENT & PLAN:  Thrombocytosis This is a very pleasant 60 year old female patient with past medical history significant for anxiety and depression referred to hematology for evaluation of thrombocytosis. We have discussed the following details about thrombocytosis. We will proceed with investigation today for myeloproliferative work-up, CRP, CBC and CMP, iron panel and ferritin   Differential diagnosis  1. Primary thrombocytosis: Related to myeloproliferative disorders of the bone marrow especially essential thrombocytosis and CML. I would like to send for BCR-ABL as well as JAK-2 mutation analysis. Patient understands that JAK2 mutation is only present in 50% of essential thrombocytosis so the test is advantageous only if it is positive. If it is negative, it does not rule out. 2. Secondary/reactive thrombocytosis Different causes including infections, inflammation, iron deficiency.    Treatment options: 1. If it is primary essential thrombocytosis, treatment would depend on platelet count level as well as history of thrombosis. A. For low risk patients, (platelet counts less than 1000 and no history of blood clots) the treatment would be with aspirin therapy B. for high risk patients(platelet counts greater than 1000/history of blood clot) the treatment would be platelet lowering therapy with aspirin 2. Treatment of secondary thrombocytosis would be to treat underlying cause. There would not be any risk of thrombosis with secondary thrombocytosis.  She will return to clinic in 3 weeks to review labs and to discuss additional recommendations.    Orders Placed This Encounter  Procedures  . CBC with Differential/Platelet    Standing Status:    Standing    Number of Occurrences:   22    Standing Expiration Date:   12/09/2021  . Comprehensive metabolic panel    Standing Status:   Standing    Number of Occurrences:   33    Standing Expiration Date:   12/09/2021  . JAK2 V617F, w Reflex to CALR/E12/MPL  . Iron and TIBC    Standing Status:   Future    Standing Expiration Date:   12/09/2021  . Ferritin    Standing Status:   Future    Standing Expiration Date:   12/09/2021  . C-reactive protein    Standing Status:   Future    Standing Expiration Date:   12/09/2021   HISTORY OF PRESENTING ILLNESS:  Brittany Lynch 60 y.o. female is here because of thrombocytosis.  60 year old female patient with past medical history significant for hypertension, dyslipidemia, anxiety, depression referred to hematology for evaluation of thrombocytosis.  Patient arrived to the appointment today with her family.  She tells me that for the past several months, she has been feeling very tired, wakes up tired, had a sleep test which did not show any evidence of sleep apnea.  Besides fatigue, she has had some night sweats but she attributed the night sweats to menopause.  She has been started on estrogen supplementation which has helped her night sweats.  She has lost about 40 pounds of weight in the past 1 year which is mostly unintentional.  She has chronic aches and pains in her shoulders and arms and leg pain medication.  No history of DVT/PE.  No erythromelalgia or intractable pruritus.  She most recently had a sinus infection when she had the blood drawn and wondered if that were caused by platelet count.  Rest of  the pertinent 10 point ROS reviewed and negative.  MEDICAL HISTORY:  Past Medical History:  Diagnosis Date  . Anxiety   . Depression   . Hyperlipidemia   . Hypertension   . Thyroid disease     SURGICAL HISTORY: Past Surgical History:  Procedure Laterality Date  . ABDOMINAL HYSTERECTOMY    . LUMBAR LAMINECTOMY/DECOMPRESSION MICRODISCECTOMY  Bilateral 03/06/2018   Procedure: Laminectomy and Foraminotomy - Lumbar Two-Three, Three-Four bilateral;  Surgeon: Eustace Moore, MD;  Location: [facility];  Service: Neurosurgery;  Laterality: Bilateral;  Laminectomy and Foraminotomy - Lumbar Two-Three, Three-Four bilateral  . NECK SURGERY    . Right hand and wrist surgery Right    Laceration   . SPINE SURGERY      SOCIAL HISTORY: Social History   Socioeconomic History  . Marital status: Legally Separated    Spouse name: Not on file  . Number of children: 3  . Years of education: Not on file  . Highest education level: 9th grade  Occupational History  . Occupation: Retired  Tobacco Use  . Smoking status: Former Smoker    Quit date: 07/16/2001    Years since quitting: 19.4  . Smokeless tobacco: Never Used  Vaping Use  . Vaping Use: Never used  Substance and Sexual Activity  . Alcohol use: No    Comment: hx alcoholism. none since age 60  . Drug use: No    Comment: none since age 66  . Sexual activity: Yes  Other Topics Concern  . Not on file  Social History Narrative   Lives in apartment basement of daughters home    Social Determinants of Health   Financial Resource Strain: Not on file  Food Insecurity: Not on file  Transportation Needs: Not on file  Physical Activity: Not on file  Stress: Not on file  Social Connections: Not on file  Intimate Partner Violence: Not on file    FAMILY HISTORY: Family History  Problem Relation Age of Onset  . Mental illness Mother   . Alcohol abuse Father   . Diabetes Father   . Heart disease Father     ALLERGIES:  is allergic to chocolate, iodinated diagnostic agents, methotrexate derivatives, lisinopril, latex, and tape.  MEDICATIONS:  Current Outpatient Medications  Medication Sig Dispense Refill  . atorvastatin (LIPITOR) 20 MG tablet Take 1 tablet (20 mg total) by mouth daily. 90 tablet 0  . buPROPion (WELLBUTRIN SR) 150 MG 12 hr tablet Take 1 tablet (150 mg total) by mouth  daily. 90 tablet 2  . busPIRone (BUSPAR) 10 MG tablet Take 1 tablet (10 mg total) by mouth 3 (three) times daily. 90 tablet 2  . diazepam (VALIUM) 5 MG tablet take one tablet po 30 min prior to injection. May repeat in one hour if needed.    . diazepam (VALIUM) 5 MG tablet SMARTSIG:1 Tablet(s) By Mouth    . diclofenac (VOLTAREN) 50 MG EC tablet take 1 tablet by mouth BID with food    . estradiol (ESTRACE) 2 MG tablet Take 1 tablet (2 mg total) by mouth daily. 90 tablet 2  . fenofibrate (TRICOR) 145 MG tablet Take 1 tablet (145 mg total) by mouth daily. 90 tablet 0  . hydrOXYzine (ATARAX/VISTARIL) 25 MG tablet Take 25 mg by mouth 3 (three) times daily as needed.    Marland Kitchen levothyroxine (SYNTHROID) 100 MCG tablet Take 1 tablet (100 mcg total) by mouth daily. 90 tablet 1  . lubiprostone (AMITIZA) 24 MCG capsule TAKE ONE CAPSULE BY  MOUTH ONCE DAILY FOR CONSTIPATION 90 capsule 0  . methocarbamol (ROBAXIN) 750 MG tablet Take 750 mg by mouth every 8 (eight) hours.    . metoprolol tartrate (LOPRESSOR) 50 MG tablet Take 1 tablet (50 mg total) by mouth 2 (two) times daily. 60 tablet 5  . Multiple Vitamin (MULTIVITAMIN WITH MINERALS) TABS tablet Take 1 tablet by mouth daily. Women's 50+    . omeprazole (PRILOSEC) 40 MG capsule Take 1 capsule (40 mg total) by mouth daily. 90 capsule 1  . pramipexole (MIRAPEX) 0.5 MG tablet SMARTSIG:2 Tablet(s) By Mouth Every Evening 180 tablet 2  . venlafaxine XR (EFFEXOR-XR) 150 MG 24 hr capsule TAKE 1 CAPSULE BY MOUTH ONCE DAILY WITH BREAKFAST 30 capsule 0  . diphenhydrAMINE (BENADRYL) 25 mg capsule Take 25 mg by mouth every 6 (six) hours as needed. After getting neck injection (Patient not taking: Reported on 12/08/2020)    . oxyCODONE (OXY IR/ROXICODONE) 5 MG immediate release tablet Take 5-10 mg by mouth every 8 (eight) hours as needed for severe pain. (Patient not taking: Reported on 12/08/2020)     No current facility-administered medications for this visit.    PHYSICAL  EXAMINATION: ECOG PERFORMANCE STATUS: 0 - Asymptomatic  Vitals:   12/09/20 1300  BP: (!) 143/72  Resp: 16  Temp: 98 F (36.7 C)  SpO2: 100%   Filed Weights   12/09/20 1300  Weight: 143 lb 11.2 oz (65.2 kg)    GENERAL:alert, no distress and comfortable SKIN: skin color, texture, turgor are normal, no rashes or significant lesions EYES: normal, conjunctiva are pink and non-injected, sclera clear OROPHARYNX:no exudate, no erythema and lips, buccal mucosa, and tongue normal  NECK: supple, thyroid normal size, non-tender, without nodularity LYMPH:  no palpable lymphadenopathy in the cervical, axillary LUNGS: clear to auscultation and percussion with normal breathing effort HEART: regular rate & rhythm and no murmurs and no lower extremity edema ABDOMEN:abdomen soft, non-tender and normal bowel sounds Musculoskeletal:no cyanosis of digits and no clubbing  PSYCH: alert & oriented x 3 with fluent speech NEURO: no focal motor/sensory deficits  LABORATORY DATA:  I have reviewed the data as listed Lab Results  Component Value Date   WBC 5.9 11/17/2020   HGB 12.1 11/17/2020   HCT 35.3 11/17/2020   MCV 85 11/17/2020   PLT 536 (H) 11/17/2020     Chemistry      Component Value Date/Time   NA 140 11/17/2020 1046   K 4.7 11/17/2020 1046   CL 100 11/17/2020 1046   CO2 23 11/17/2020 1046   BUN 14 11/17/2020 1046   CREATININE 0.83 11/17/2020 1046   CREATININE 0.77 06/28/2016 1143      Component Value Date/Time   CALCIUM 9.8 11/17/2020 1046   ALKPHOS 59 11/17/2020 1046   AST 19 11/17/2020 1046   ALT 10 11/17/2020 1046   BILITOT 0.3 11/17/2020 1046     She has mild thrombocytosis with platelet count of 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  Prior to this about 4 months ago her platelet count was 466,000.  No leukocytosis, basophilia.  RADIOGRAPHIC STUDIES: I have personally reviewed the radiological images as listed and agreed with the findings in the report. No results found.  All questions were  answered. The patient knows to call the clinic with any problems, questions or concerns. I spent 30 minutes in the care of this patient including H and P, review of records, counseling and coordination of care.     Brittany Pike, MD 12/09/2020 1:29 PM

## 2020-12-09 NOTE — Assessment & Plan Note (Signed)
This is a very pleasant 60 year old female patient with past medical history significant for anxiety and depression referred to hematology for evaluation of thrombocytosis. We have discussed the following details about thrombocytosis. We will proceed with investigation today for myeloproliferative work-up, CRP, CBC and CMP, iron panel and ferritin   Differential diagnosis  1. Primary thrombocytosis: Related to myeloproliferative disorders of the bone marrow especially essential thrombocytosis and CML. I would like to send for BCR-ABL as well as JAK-2 mutation analysis. Patient understands that JAK2 mutation is only present in 50% of essential thrombocytosis so the test is advantageous only if it is positive. If it is negative, it does not rule out. 2. Secondary/reactive thrombocytosis Different causes including infections, inflammation, iron deficiency.    Treatment options: 1. If it is primary essential thrombocytosis, treatment would depend on platelet count level as well as history of thrombosis. A. For low risk patients, (platelet counts less than 1000 and no history of blood clots) the treatment would be with aspirin therapy B. for high risk patients(platelet counts greater than 1000/history of blood clot) the treatment would be platelet lowering therapy with aspirin 2. Treatment of secondary thrombocytosis would be to treat underlying cause. There would not be any risk of thrombosis with secondary thrombocytosis.  She will return to clinic in 3 weeks to review labs and to discuss additional recommendations.

## 2020-12-13 ENCOUNTER — Encounter (HOSPITAL_COMMUNITY): Payer: Self-pay | Admitting: *Deleted

## 2020-12-13 ENCOUNTER — Encounter (HOSPITAL_COMMUNITY): Payer: Self-pay | Admitting: Hematology and Oncology

## 2020-12-13 ENCOUNTER — Other Ambulatory Visit (HOSPITAL_COMMUNITY): Payer: Self-pay | Admitting: *Deleted

## 2020-12-13 DIAGNOSIS — D509 Iron deficiency anemia, unspecified: Secondary | ICD-10-CM

## 2020-12-13 DIAGNOSIS — M4802 Spinal stenosis, cervical region: Secondary | ICD-10-CM | POA: Diagnosis not present

## 2020-12-13 DIAGNOSIS — D75839 Thrombocytosis, unspecified: Secondary | ICD-10-CM

## 2020-12-13 NOTE — Progress Notes (Signed)
Message sent to patient

## 2020-12-22 DIAGNOSIS — M5412 Radiculopathy, cervical region: Secondary | ICD-10-CM | POA: Diagnosis not present

## 2020-12-26 LAB — CALR + JAK2 E12-15 + MPL (REFLEXED)

## 2020-12-26 LAB — JAK2 V617F, W REFLEX TO CALR/E12/MPL

## 2020-12-29 ENCOUNTER — Other Ambulatory Visit: Payer: Self-pay | Admitting: Family

## 2020-12-29 DIAGNOSIS — F411 Generalized anxiety disorder: Secondary | ICD-10-CM

## 2020-12-29 DIAGNOSIS — F331 Major depressive disorder, recurrent, moderate: Secondary | ICD-10-CM

## 2021-01-02 ENCOUNTER — Other Ambulatory Visit: Payer: Self-pay

## 2021-01-02 ENCOUNTER — Inpatient Hospital Stay (HOSPITAL_COMMUNITY): Payer: Medicare Other | Attending: Hematology

## 2021-01-02 DIAGNOSIS — E079 Disorder of thyroid, unspecified: Secondary | ICD-10-CM | POA: Diagnosis not present

## 2021-01-02 DIAGNOSIS — R350 Frequency of micturition: Secondary | ICD-10-CM | POA: Insufficient documentation

## 2021-01-02 DIAGNOSIS — D75839 Thrombocytosis, unspecified: Secondary | ICD-10-CM | POA: Diagnosis not present

## 2021-01-02 DIAGNOSIS — I10 Essential (primary) hypertension: Secondary | ICD-10-CM | POA: Diagnosis not present

## 2021-01-02 DIAGNOSIS — D509 Iron deficiency anemia, unspecified: Secondary | ICD-10-CM

## 2021-01-02 DIAGNOSIS — R309 Painful micturition, unspecified: Secondary | ICD-10-CM | POA: Diagnosis not present

## 2021-01-02 DIAGNOSIS — Z79899 Other long term (current) drug therapy: Secondary | ICD-10-CM | POA: Insufficient documentation

## 2021-01-02 DIAGNOSIS — D696 Thrombocytopenia, unspecified: Secondary | ICD-10-CM | POA: Diagnosis not present

## 2021-01-02 DIAGNOSIS — E785 Hyperlipidemia, unspecified: Secondary | ICD-10-CM | POA: Insufficient documentation

## 2021-01-02 DIAGNOSIS — E7800 Pure hypercholesterolemia, unspecified: Secondary | ICD-10-CM | POA: Diagnosis not present

## 2021-01-02 LAB — CBC WITH DIFFERENTIAL/PLATELET
Abs Immature Granulocytes: 0.06 10*3/uL (ref 0.00–0.07)
Basophils Absolute: 0.1 10*3/uL (ref 0.0–0.1)
Basophils Relative: 1 %
Eosinophils Absolute: 0.1 10*3/uL (ref 0.0–0.5)
Eosinophils Relative: 1 %
HCT: 39 % (ref 36.0–46.0)
Hemoglobin: 12.6 g/dL (ref 12.0–15.0)
Immature Granulocytes: 1 %
Lymphocytes Relative: 33 %
Lymphs Abs: 3.4 10*3/uL (ref 0.7–4.0)
MCH: 29.2 pg (ref 26.0–34.0)
MCHC: 32.3 g/dL (ref 30.0–36.0)
MCV: 90.5 fL (ref 80.0–100.0)
Monocytes Absolute: 0.8 10*3/uL (ref 0.1–1.0)
Monocytes Relative: 8 %
Neutro Abs: 6.1 10*3/uL (ref 1.7–7.7)
Neutrophils Relative %: 56 %
Platelets: 482 10*3/uL — ABNORMAL HIGH (ref 150–400)
RBC: 4.31 MIL/uL (ref 3.87–5.11)
RDW: 14.2 % (ref 11.5–15.5)
WBC: 10.5 10*3/uL (ref 4.0–10.5)
nRBC: 0 % (ref 0.0–0.2)

## 2021-01-02 LAB — COMPREHENSIVE METABOLIC PANEL
ALT: 16 U/L (ref 0–44)
AST: 16 U/L (ref 15–41)
Albumin: 4.4 g/dL (ref 3.5–5.0)
Alkaline Phosphatase: 35 U/L — ABNORMAL LOW (ref 38–126)
Anion gap: 8 (ref 5–15)
BUN: 17 mg/dL (ref 6–20)
CO2: 29 mmol/L (ref 22–32)
Calcium: 9.2 mg/dL (ref 8.9–10.3)
Chloride: 99 mmol/L (ref 98–111)
Creatinine, Ser: 0.69 mg/dL (ref 0.44–1.00)
GFR, Estimated: >60 mL/min (ref >60)
Glucose, Bld: 110 mg/dL — ABNORMAL HIGH (ref 70–99)
Potassium: 4.6 mmol/L (ref 3.5–5.1)
Sodium: 136 mmol/L (ref 135–145)
Total Bilirubin: 0.2 mg/dL — ABNORMAL LOW (ref 0.3–1.2)
Total Protein: 7.4 g/dL (ref 6.5–8.1)

## 2021-01-02 LAB — IRON AND TIBC
Iron: 67 ug/dL (ref 28–170)
Saturation Ratios: 12 % (ref 10.4–31.8)
TIBC: 551 ug/dL — ABNORMAL HIGH (ref 250–450)
UIBC: 484 ug/dL

## 2021-01-02 LAB — FERRITIN: Ferritin: 16 ng/mL (ref 11–307)

## 2021-01-03 ENCOUNTER — Other Ambulatory Visit: Payer: Self-pay | Admitting: Family

## 2021-01-03 DIAGNOSIS — F411 Generalized anxiety disorder: Secondary | ICD-10-CM

## 2021-01-03 DIAGNOSIS — I10 Essential (primary) hypertension: Secondary | ICD-10-CM

## 2021-01-04 DIAGNOSIS — J384 Edema of larynx: Secondary | ICD-10-CM | POA: Diagnosis not present

## 2021-01-04 DIAGNOSIS — J343 Hypertrophy of nasal turbinates: Secondary | ICD-10-CM | POA: Diagnosis not present

## 2021-01-04 DIAGNOSIS — M542 Cervicalgia: Secondary | ICD-10-CM | POA: Diagnosis not present

## 2021-01-06 ENCOUNTER — Inpatient Hospital Stay (HOSPITAL_BASED_OUTPATIENT_CLINIC_OR_DEPARTMENT_OTHER): Payer: Medicare Other | Admitting: Hematology and Oncology

## 2021-01-06 ENCOUNTER — Other Ambulatory Visit: Payer: Self-pay

## 2021-01-06 ENCOUNTER — Encounter (HOSPITAL_COMMUNITY): Payer: Self-pay | Admitting: Hematology and Oncology

## 2021-01-06 VITALS — BP 143/70 | HR 51 | Temp 97.4°F | Resp 18 | Wt 141.6 lb

## 2021-01-06 DIAGNOSIS — E7800 Pure hypercholesterolemia, unspecified: Secondary | ICD-10-CM | POA: Diagnosis not present

## 2021-01-06 DIAGNOSIS — E785 Hyperlipidemia, unspecified: Secondary | ICD-10-CM | POA: Diagnosis not present

## 2021-01-06 DIAGNOSIS — E079 Disorder of thyroid, unspecified: Secondary | ICD-10-CM | POA: Diagnosis not present

## 2021-01-06 DIAGNOSIS — R309 Painful micturition, unspecified: Secondary | ICD-10-CM | POA: Diagnosis not present

## 2021-01-06 DIAGNOSIS — D75839 Thrombocytosis, unspecified: Secondary | ICD-10-CM | POA: Diagnosis not present

## 2021-01-06 DIAGNOSIS — I10 Essential (primary) hypertension: Secondary | ICD-10-CM | POA: Diagnosis not present

## 2021-01-06 DIAGNOSIS — D696 Thrombocytopenia, unspecified: Secondary | ICD-10-CM | POA: Diagnosis not present

## 2021-01-06 DIAGNOSIS — R350 Frequency of micturition: Secondary | ICD-10-CM | POA: Diagnosis not present

## 2021-01-06 DIAGNOSIS — Z79899 Other long term (current) drug therapy: Secondary | ICD-10-CM | POA: Diagnosis not present

## 2021-01-06 LAB — URINALYSIS, COMPLETE (UACMP) WITH MICROSCOPIC
Bilirubin Urine: NEGATIVE
Glucose, UA: NEGATIVE mg/dL
Ketones, ur: NEGATIVE mg/dL
Nitrite: POSITIVE — AB
Protein, ur: NEGATIVE mg/dL
Specific Gravity, Urine: 1.001 — ABNORMAL LOW (ref 1.005–1.030)
pH: 6 (ref 5.0–8.0)

## 2021-01-06 MED ORDER — CIPROFLOXACIN HCL 500 MG PO TABS: 500.0000 mg | ORAL_TABLET | Freq: Two times a day (BID) | ORAL | 0 refills | Status: DC

## 2021-01-06 NOTE — Progress Notes (Signed)
Brittany Lynch  Patient Care Team: Sharion Balloon, FNP as PCP - General (Family Medicine) Gala Romney, Cristopher Estimable, MD as Consulting Physician (Gastroenterology)  CHIEF COMPLAINTS/PURPOSE OF CONSULTATION:  Thrombocytosis.  ASSESSMENT & PLAN:   This is a very pleasant 60 yr old female patient with thrombocytosis referred to hematology for evaluation. MPN work up neg.  She does appear to have iron deficiency.  I have recommended iron supplementation which is likely responsible for reactive thrombocytosis. She will continue oral iron supplementation and RTC in 3 months for FU  2. Urinary frequency and some pain with urination. She has symptoms concerning for UTI. UA with positive nitrite and moderate leukocytes. Will recommend ciprofloxacin for 5 day course. She was instructed to call her physician or go to the ED with any worsening symptoms.  HISTORY OF PRESENTING ILLNESS:   Brittany Lynch 60 y.o. female is here because of thrombocytosis.  60 year old female patient with past medical history significant for hypertension, dyslipidemia, anxiety, depression referred to hematology for evaluation of thrombocytosis.    Brittany Lynch is here for FU. She is doing well today. She occasionally notices blurred vision which improves with food intake, fatigue, chronic neck pain and back pain. Pain with urination has also been an issue for the past one day. She says effexor has been causing high cholesterol. Rest of the pertinent 10 point ROS reviewed and negative.  MEDICAL HISTORY:  Past Medical History:  Diagnosis Date   Anxiety    Depression    Hyperlipidemia    Hypertension    Thyroid disease     SURGICAL HISTORY: Past Surgical History:  Procedure Laterality Date   ABDOMINAL HYSTERECTOMY     LUMBAR LAMINECTOMY/DECOMPRESSION MICRODISCECTOMY Bilateral 03/06/2018   Procedure: Laminectomy and Foraminotomy - Lumbar Two-Three, Three-Four bilateral;  Surgeon: Eustace Moore,  MD;  Location: [place];  Service: Neurosurgery;  Laterality: Bilateral;  Laminectomy and Foraminotomy - Lumbar Two-Three, Three-Four bilateral   NECK SURGERY     Right hand and wrist surgery Right    Laceration    SPINE SURGERY      SOCIAL HISTORY: Social History   Socioeconomic History   Marital status: Legally Separated    Spouse name: Not on file   Number of children: 3   Years of education: Not on file   Highest education level: 9th grade  Occupational History   Occupation: Retired  Tobacco Use   Smoking status: Former    Pack years: 0.00    Types: Cigarettes    Quit date: 07/16/2001    Years since quitting: 19.4   Smokeless tobacco: Never  Vaping Use   Vaping Use: Never used  Substance and Sexual Activity   Alcohol use: No    Comment: hx alcoholism. none since age 76   Drug use: No    Comment: none since age 63   Sexual activity: Yes  Other Topics Concern   Not on file  Social History Narrative   Lives in apartment basement of daughters home    Social Determinants of Health   Financial Resource Strain: Low Risk    Difficulty of Paying Living Expenses: Not very hard  Food Insecurity: No Food Insecurity   Worried About Charity fundraiser in the Last Year: Never true   Ran Out of Food in the Last Year: Never true  Transportation Needs: No Transportation Needs   Lack of Transportation (Medical): No   Lack of Transportation (Non-Medical): No  Physical Activity: Inactive  Days of Exercise per Week: 0 days   Minutes of Exercise per Session: 0 min  Stress: No Stress Concern Present   Feeling of Stress : Not at all  Social Connections: Moderately Isolated   Frequency of Communication with Friends and Family: More than three times a week   Frequency of Social Gatherings with Friends and Family: More than three times a week   Attends Religious Services: More than 4 times per year   Active Member of [employer] or Organizations: No   Attends Archivist Meetings:  Never   Marital Status: Separated  Human resources officer Violence: Not At Risk   Fear of Current or Ex-Partner: No   Emotionally Abused: No   Physically Abused: No   Sexually Abused: No    FAMILY HISTORY: Family History  Problem Relation Age of Onset   Mental illness Mother    Alcohol abuse Father    Diabetes Father    Heart disease Father     ALLERGIES:  is allergic to chocolate, iodinated diagnostic agents, methotrexate derivatives, lisinopril, latex, and tape.  MEDICATIONS:  Current Outpatient Medications  Medication Sig Dispense Refill   atorvastatin (LIPITOR) 20 MG tablet Take 1 tablet (20 mg total) by mouth daily. 90 tablet 0   buPROPion (WELLBUTRIN SR) 150 MG 12 hr tablet Take 1 tablet (150 mg total) by mouth daily. 90 tablet 2   busPIRone (BUSPAR) 10 MG tablet Take 1 tablet (10 mg total) by mouth 3 (three) times daily. 90 tablet 0   diazepam (VALIUM) 5 MG tablet take one tablet po 30 min prior to injection. May repeat in one hour if needed.     diazepam (VALIUM) 5 MG tablet SMARTSIG:1 Tablet(s) By Mouth     diclofenac (VOLTAREN) 50 MG EC tablet take 1 tablet by mouth BID with food     diphenhydrAMINE (BENADRYL) 25 mg capsule Take 25 mg by mouth every 6 (six) hours as needed. After getting neck injection     estradiol (ESTRACE) 2 MG tablet Take 1 tablet (2 mg total) by mouth daily. 90 tablet 2   fenofibrate (TRICOR) 145 MG tablet Take 1 tablet (145 mg total) by mouth daily. 90 tablet 0   hydrOXYzine (ATARAX/VISTARIL) 25 MG tablet Take 25 mg by mouth 3 (three) times daily as needed.     levothyroxine (SYNTHROID) 100 MCG tablet Take 1 tablet (100 mcg total) by mouth daily. 90 tablet 1   lubiprostone (AMITIZA) 24 MCG capsule TAKE ONE CAPSULE BY MOUTH ONCE DAILY FOR CONSTIPATION 90 capsule 0   methocarbamol (ROBAXIN) 750 MG tablet Take 750 mg by mouth every 8 (eight) hours.     metoprolol tartrate (LOPRESSOR) 50 MG tablet Take 1 tablet (50 mg total) by mouth 2 (two) times daily. 60  tablet 0   Multiple Vitamin (MULTIVITAMIN WITH MINERALS) TABS tablet Take 1 tablet by mouth daily. Women's 50+     omeprazole (PRILOSEC) 40 MG capsule Take 1 capsule (40 mg total) by mouth daily. 90 capsule 0   oxyCODONE (OXY IR/ROXICODONE) 5 MG immediate release tablet Take 5-10 mg by mouth every 8 (eight) hours as needed for severe pain.     pramipexole (MIRAPEX) 0.5 MG tablet SMARTSIG:2 Tablet(s) By Mouth Every Evening 180 tablet 2   venlafaxine XR (EFFEXOR-XR) 150 MG 24 hr capsule TAKE 1 CAPSULE BY MOUTH ONCE DAILY WITH BREAKFAST 30 capsule 0   No current facility-administered medications for this visit.    PHYSICAL EXAMINATION: ECOG PERFORMANCE STATUS: 0 - Asymptomatic  Vitals:   01/06/21 1054  BP: (!) 143/70  Pulse: (!) 51  Resp: 18  Temp: (!) 97.4 F (36.3 C)  SpO2: 100%   Filed Weights   01/06/21 1054  Weight: 141 lb 9.6 oz (64.2 kg)    GENERAL:alert, no distress and comfortable SKIN: skin color, texture, turgor are normal, no rashes or significant lesions EYES: normal, conjunctiva are pink and non-injected, sclera clear OROPHARYNX:no exudate, no erythema and lips, buccal mucosa, and tongue normal  NECK: supple, thyroid normal size, non-tender, without nodularity LYMPH:  no palpable lymphadenopathy in the cervical, axillary LUNGS: clear to auscultation and percussion with normal breathing effort HEART: regular rate & rhythm and no murmurs and no lower extremity edema ABDOMEN:abdomen soft, non-tender and normal bowel sounds Musculoskeletal:no cyanosis of digits and no clubbing  PSYCH: alert & oriented x 3 with fluent speech NEURO: no focal motor/sensory deficits  LABORATORY DATA:  I have reviewed the data as listed Lab Results  Component Value Date   WBC 10.5 01/02/2021   HGB 12.6 01/02/2021   HCT 39.0 01/02/2021   MCV 90.5 01/02/2021   PLT 482 (H) 01/02/2021     Chemistry      Component Value Date/Time   NA 136 01/02/2021 1105   NA 140 11/17/2020 1046    K 4.6 01/02/2021 1105   CL 99 01/02/2021 1105   CO2 29 01/02/2021 1105   BUN 17 01/02/2021 1105   BUN 14 11/17/2020 1046   CREATININE 0.69 01/02/2021 1105   CREATININE 0.77 06/28/2016 1143      Component Value Date/Time   CALCIUM 9.2 01/02/2021 1105   ALKPHOS 35 (L) 01/02/2021 1105   AST 16 01/02/2021 1105   ALT 16 01/02/2021 1105   BILITOT 0.2 (L) 01/02/2021 1105   BILITOT 0.3 11/17/2020 1046     She has mild thrombocytosis with platelet count of 536,000.  Prior to this about 4 months ago her platelet count was 466,000.  No leukocytosis, basophilia.  RADIOGRAPHIC STUDIES: I have personally reviewed the radiological images as listed and agreed with the findings in the report. No results found.  All questions were answered. The patient knows to call the clinic with any problems, questions or concerns. I spent 30 minutes in the care of this patient including H and P, review of records, counseling and coordination of care.     Benay Pike, MD 01/06/2021 11:11 AM

## 2021-01-09 ENCOUNTER — Other Ambulatory Visit (HOSPITAL_COMMUNITY): Payer: Self-pay | Admitting: *Deleted

## 2021-01-09 NOTE — Telephone Encounter (Signed)
I spoke with patient and she is still having some pain in her lower back. She is not sure if that is from her usual back pain or maybe from this UTI.  She has only had 4 doses of the antibiotic as of now.  She was instructed to call us back tomorrow if her symptoms haven't subsided and we can address with the physician on duty tomorrow. She verbalizes understanding.

## 2021-01-19 ENCOUNTER — Encounter: Payer: Self-pay | Admitting: Family

## 2021-01-19 ENCOUNTER — Ambulatory Visit (INDEPENDENT_AMBULATORY_CARE_PROVIDER_SITE_OTHER): Payer: Medicare Other | Admitting: Family

## 2021-01-19 ENCOUNTER — Other Ambulatory Visit: Payer: Self-pay

## 2021-01-19 VITALS — BP 128/72 | HR 62 | Temp 96.9°F | Ht 62.0 in | Wt 140.2 lb

## 2021-01-19 DIAGNOSIS — R300 Dysuria: Secondary | ICD-10-CM | POA: Diagnosis not present

## 2021-01-19 DIAGNOSIS — F411 Generalized anxiety disorder: Secondary | ICD-10-CM | POA: Diagnosis not present

## 2021-01-19 DIAGNOSIS — E039 Hypothyroidism, unspecified: Secondary | ICD-10-CM

## 2021-01-19 DIAGNOSIS — F331 Major depressive disorder, recurrent, moderate: Secondary | ICD-10-CM | POA: Diagnosis not present

## 2021-01-19 DIAGNOSIS — R399 Unspecified symptoms and signs involving the genitourinary system: Secondary | ICD-10-CM

## 2021-01-19 DIAGNOSIS — M5412 Radiculopathy, cervical region: Secondary | ICD-10-CM | POA: Diagnosis not present

## 2021-01-19 LAB — URINALYSIS, COMPLETE
Bilirubin, UA: NEGATIVE
Glucose, UA: NEGATIVE
Ketones, UA: NEGATIVE
Nitrite, UA: NEGATIVE
Protein,UA: NEGATIVE
Specific Gravity, UA: <1.005 — ABNORMAL LOW (ref 1.005–1.030)
Urobilinogen, Ur: 0.2 mg/dL (ref 0.2–1.0)
pH, UA: 6 (ref 5.0–7.5)

## 2021-01-19 LAB — MICROSCOPIC EXAMINATION

## 2021-01-19 MED ORDER — CEPHALEXIN 500 MG PO CAPS: 500.0000 mg | ORAL_CAPSULE | Freq: Two times a day (BID) | ORAL | 0 refills | Status: DC

## 2021-01-19 NOTE — Patient Instructions (Signed)

## 2021-01-19 NOTE — Progress Notes (Signed)
Subjective:    Patient ID: Brittany Lynch, female    DOB: [birth year], 60 y.o.   MRN: [id number]  Chief Complaint  Patient presents with   Hypothyroidism   concentration    Both daughter hve add and they want her to discuss it with you    Dysuria   Pt presents to the office today to recheck thyroid.  Dysuria  This is a new problem. The current episode started 1 to 4 weeks ago. The problem occurs intermittently. The quality of the pain is described as burning. Associated symptoms include hesitancy, nausea and urgency. Pertinent negatives include no frequency or hematuria. Brittany Lynch has tried antibiotics for the symptoms. The treatment provided mild relief.  Thyroid Problem Presents for follow-up visit. Symptoms include anxiety, constipation and fatigue. Patient reports no diarrhea or hair loss. (concentration problems) The symptoms have been stable.  Anxiety Presents for follow-up visit. Symptoms include decreased concentration, excessive worry, nausea, nervous/anxious behavior and restlessness. Symptoms occur most days.       Review of Systems  Constitutional:  Positive for fatigue.  Gastrointestinal:  Positive for constipation and nausea. Negative for diarrhea.  Genitourinary:  Positive for dysuria, hesitancy and urgency. Negative for frequency and hematuria.  Psychiatric/Behavioral:  Positive for decreased concentration. The patient is nervous/anxious.   All other systems reviewed and are negative.     Objective:   Physical Exam Vitals reviewed.  Constitutional:      General: Brittany Lynch is not in acute distress.    Appearance: Brittany Lynch is well-developed.  HENT:     Head: Normocephalic and atraumatic.     Right Ear: Tympanic membrane normal.     Left Ear: Tympanic membrane normal.  Eyes:     Pupils: Pupils are equal, round, and reactive to light.  Neck:     Thyroid: No thyromegaly.  Cardiovascular:     Rate and Rhythm: Normal rate and regular rhythm.     Heart sounds: Normal heart  sounds. No murmur heard. Pulmonary:     Effort: Pulmonary effort is normal. No respiratory distress.     Breath sounds: Normal breath sounds. No wheezing.  Abdominal:     General: Bowel sounds are normal. There is no distension.     Palpations: Abdomen is soft.     Tenderness: There is no abdominal tenderness.  Musculoskeletal:        General: No tenderness. Normal range of motion.     Cervical back: Normal range of motion and neck supple.  Skin:    General: Skin is warm and dry.  Neurological:     Mental Status: Brittany Lynch is alert and oriented to person, place, and time.     Cranial Nerves: No cranial nerve deficit.     Deep Tendon Reflexes: Reflexes are normal and symmetric.  Psychiatric:        Behavior: Behavior normal.        Thought Content: Thought content normal.        Judgment: Judgment normal.      BP 128/72   Pulse 62   Temp (!) 96.9 F (36.1 C) (Temporal)   Ht 5\' 2"  (1.575 m)   Wt 140 lb 3.2 oz (63.6 kg)   BMI 25.64 kg/m      Assessment & Plan:  ZIAH TURVEY comes in today with chief complaint of Hypothyroidism, concentration (Both daughter hve add and they want her to discuss it with you ), and Dysuria   Diagnosis and orders addressed:  1. Dysuria -  Urinalysis, Complete - Urine Culture  2. Hypothyroidism, unspecified type - TSH  3. GAD (generalized anxiety disorder)  4. Moderate episode of recurrent major depressive disorder ([place])  5. UTI symptoms Force fluids AZO over the counter X2 days RTO prn Culture pending - cephALEXin (KEFLEX) 500 MG capsule; Take 1 capsule (500 mg total) by mouth 2 (two) times daily.  Dispense: 14 capsule; Refill: 0   Labs pending Health Maintenance reviewed Diet and exercise encouraged  Follow up plan: 3 months    [name], FNP

## 2021-01-20 ENCOUNTER — Other Ambulatory Visit: Payer: Self-pay | Admitting: Neurological Surgery

## 2021-01-20 DIAGNOSIS — M4802 Spinal stenosis, cervical region: Secondary | ICD-10-CM

## 2021-01-20 LAB — TSH: TSH: 1.32 u[IU]/mL (ref 0.450–4.500)

## 2021-01-21 LAB — URINE CULTURE

## 2021-01-23 ENCOUNTER — Other Ambulatory Visit: Payer: Self-pay

## 2021-01-23 ENCOUNTER — Ambulatory Visit
Admission: RE | Admit: 2021-01-23 | Discharge: 2021-01-23 | Disposition: A | Discharge status: Home / Self Care | Payer: Medicare Other | Source: Ambulatory Visit | Attending: Family | Admitting: Family

## 2021-01-23 DIAGNOSIS — Z1231 Encounter for screening mammogram for malignant neoplasm of breast: Secondary | ICD-10-CM | POA: Diagnosis not present

## 2021-01-27 ENCOUNTER — Other Ambulatory Visit: Payer: Self-pay | Admitting: Physician Assistant

## 2021-01-27 DIAGNOSIS — R928 Other abnormal and inconclusive findings on diagnostic imaging of breast: Secondary | ICD-10-CM

## 2021-01-28 ENCOUNTER — Other Ambulatory Visit: Payer: Self-pay | Admitting: Family

## 2021-01-28 DIAGNOSIS — E785 Hyperlipidemia, unspecified: Secondary | ICD-10-CM

## 2021-01-28 DIAGNOSIS — F411 Generalized anxiety disorder: Secondary | ICD-10-CM

## 2021-01-28 DIAGNOSIS — F331 Major depressive disorder, recurrent, moderate: Secondary | ICD-10-CM

## 2021-02-02 ENCOUNTER — Other Ambulatory Visit: Payer: Self-pay | Admitting: Family

## 2021-02-02 DIAGNOSIS — I10 Essential (primary) hypertension: Secondary | ICD-10-CM

## 2021-02-02 DIAGNOSIS — F411 Generalized anxiety disorder: Secondary | ICD-10-CM

## 2021-02-03 NOTE — Pre-Procedure Instructions (Signed)
Surgical Instructions    Your procedure is scheduled on Wednesday, July 27th, 2022.  Report to [facility] Main Entrance "A" at 07:55 A.M., then check in with the Admitting office.  Call this number if you have problems the morning of surgery:  [phone]   If you have any questions prior to your surgery date call [phone]: Open Monday-Friday 8am-4pm    Remember:  Do not eat or drink after midnight the night before your surgery     Take these medicines the morning of surgery with A SIP OF WATER:  atorvastatin (LIPITOR)  buPROPion (WELLBUTRIN SR)  estradiol (ESTRACE) fenofibrate (TRICOR) levothyroxine (SYNTHROID) metoprolol tartrate (LOPRESSOR) venlafaxine XR (EFFEXOR-XR)   Take these medications AS NEEDED: busPIRone (BUSPAR)  methocarbamol (ROBAXIN) omeprazole (PRILOSEC)  Oxycodone HCl   As of today, STOP taking any diclofenac (VOLTAREN), Aspirin (unless otherwise instructed by your surgeon) Aleve, Naproxen, Ibuprofen, Motrin, Advil, Goody's, BC's, all herbal medications, fish oil, and all vitamins.          Do not wear jewelry or makeup Do not wear lotions, powders, perfumes, or deodorant. Do not shave 48 hours prior to surgery.   Do not bring valuables to the hospital. DO Not wear nail polish, gel polish, artificial nails, or any other type of covering on  natural nails including finger and toenails. If patients have artificial nails, gel coating, etc. that need to be removed by a nail salon please have this removed prior to surgery or surgery may need to be canceled/delayed if the surgeon/ anesthesia feels like the patient is unable to be adequately monitored.             [facility] is not responsible for any belongings or valuables.  Do NOT Smoke (Tobacco/Vaping) or drink Alcohol 24 hours prior to your procedure If you use a CPAP at night, you may bring all equipment for your overnight stay.   Contacts, glasses, dentures or bridgework may not be worn into  surgery, please bring cases for these belongings   For patients admitted to the hospital, discharge time will be determined by your treatment team.   Patients discharged the day of surgery will not be allowed to drive home, and someone needs to stay with them for 24 hours.  ONLY 1 SUPPORT PERSON MAY BE PRESENT WHILE YOU ARE IN SURGERY. IF YOU ARE TO BE ADMITTED ONCE YOU ARE IN YOUR ROOM YOU WILL BE ALLOWED TWO (2) VISITORS.  Minor children may have two parents present. Special consideration for safety and communication needs will be reviewed on a case by case basis.  Special instructions:    Oral Hygiene is also important to reduce your risk of infection.  Remember - BRUSH YOUR TEETH THE MORNING OF SURGERY WITH YOUR REGULAR TOOTHPASTE   [facility]- Preparing For Surgery  Before surgery, you can play an important role. Because skin is not sterile, your skin needs to be as free of germs as possible. You can reduce the number of germs on your skin by washing with CHG (chlorahexidine gluconate) Soap before surgery.  CHG is an antiseptic cleaner which kills germs and bonds with the skin to continue killing germs even after washing.     Please do not use if you have an allergy to CHG or antibacterial soaps. If your skin becomes reddened/irritated stop using the CHG.  Do not shave (including legs and underarms) for at least 48 hours prior to first CHG shower. It is OK to shave your face.  Please  follow these instructions carefully.     Shower the NIGHT BEFORE SURGERY and the MORNING OF SURGERY with CHG Soap.   If you chose to wash your hair, wash your hair first as usual with your normal shampoo. After you shampoo, rinse your hair and body thoroughly to remove the shampoo.  Then [employer] and genitals (private parts) with your normal soap and rinse thoroughly to remove soap.  After that Use CHG Soap as you would any other liquid soap. You can apply CHG directly to the skin and wash gently with a  scrungie or a clean washcloth.   Apply the CHG Soap to your body ONLY FROM THE NECK DOWN.  Do not use on open wounds or open sores. Avoid contact with your eyes, ears, mouth and genitals (private parts). Wash Face and genitals (private parts)  with your normal soap.   Wash thoroughly, paying special attention to the area where your surgery will be performed.  Thoroughly rinse your body with warm water from the neck down.  DO NOT shower/wash with your normal soap after using and rinsing off the CHG Soap.  Pat yourself dry with a CLEAN TOWEL.  Wear CLEAN PAJAMAS to bed the night before surgery  Place CLEAN SHEETS on your bed the night before your surgery  DO NOT SLEEP WITH PETS.   Day of Surgery:  Take a shower with CHG soap. Wear Clean/Comfortable clothing the morning of surgery Do not apply any deodorants/lotions.   Remember to brush your teeth WITH YOUR REGULAR TOOTHPASTE.   Please read over the following fact sheets that you were given.

## 2021-02-06 ENCOUNTER — Encounter (HOSPITAL_COMMUNITY): Payer: Self-pay

## 2021-02-06 ENCOUNTER — Encounter (HOSPITAL_COMMUNITY)
Admission: RE | Admit: 2021-02-06 | Discharge: 2021-02-06 | Disposition: A | Discharge status: Home / Self Care | Payer: Medicare Other | Source: Ambulatory Visit | Attending: Neurological Surgery | Admitting: Neurological Surgery

## 2021-02-06 ENCOUNTER — Other Ambulatory Visit: Payer: Self-pay

## 2021-02-06 ENCOUNTER — Ambulatory Visit (HOSPITAL_COMMUNITY)
Admission: RE | Admit: 2021-02-06 | Discharge: 2021-02-06 | Disposition: A | Discharge status: Home / Self Care | Payer: Medicare Other | Source: Ambulatory Visit | Attending: Neurological Surgery | Admitting: Neurological Surgery

## 2021-02-06 DIAGNOSIS — M4802 Spinal stenosis, cervical region: Secondary | ICD-10-CM | POA: Insufficient documentation

## 2021-02-06 DIAGNOSIS — Z01818 Encounter for other preprocedural examination: Secondary | ICD-10-CM | POA: Insufficient documentation

## 2021-02-06 DIAGNOSIS — Z20822 Contact with and (suspected) exposure to covid-19: Secondary | ICD-10-CM | POA: Diagnosis not present

## 2021-02-06 HISTORY — DX: Headache, unspecified: R51.9

## 2021-02-06 HISTORY — DX: Unspecified osteoarthritis, unspecified site: M19.90

## 2021-02-06 HISTORY — DX: Gastro-esophageal reflux disease without esophagitis: K21.9

## 2021-02-06 HISTORY — DX: Anemia, unspecified: D64.9

## 2021-02-06 HISTORY — DX: Hypothyroidism, unspecified: E03.9

## 2021-02-06 LAB — CBC WITH DIFFERENTIAL/PLATELET
Abs Immature Granulocytes: 0.02 10*3/uL (ref 0.00–0.07)
Basophils Absolute: 0.1 10*3/uL (ref 0.0–0.1)
Basophils Relative: 1 %
Eosinophils Absolute: 0.1 10*3/uL (ref 0.0–0.5)
Eosinophils Relative: 1 %
HCT: 38.3 % (ref 36.0–46.0)
Hemoglobin: 12.6 g/dL (ref 12.0–15.0)
Immature Granulocytes: 0 %
Lymphocytes Relative: 40 %
Lymphs Abs: 3.1 10*3/uL (ref 0.7–4.0)
MCH: 29.5 pg (ref 26.0–34.0)
MCHC: 32.9 g/dL (ref 30.0–36.0)
MCV: 89.7 fL (ref 80.0–100.0)
Monocytes Absolute: 0.6 10*3/uL (ref 0.1–1.0)
Monocytes Relative: 8 %
Neutro Abs: 3.8 10*3/uL (ref 1.7–7.7)
Neutrophils Relative %: 50 %
Platelets: 469 10*3/uL — ABNORMAL HIGH (ref 150–400)
RBC: 4.27 MIL/uL (ref 3.87–5.11)
RDW: 14.2 % (ref 11.5–15.5)
WBC: 7.7 10*3/uL (ref 4.0–10.5)
nRBC: 0 % (ref 0.0–0.2)

## 2021-02-06 LAB — BASIC METABOLIC PANEL
Anion gap: 9 (ref 5–15)
BUN: 14 mg/dL (ref 6–20)
CO2: 28 mmol/L (ref 22–32)
Calcium: 9.4 mg/dL (ref 8.9–10.3)
Chloride: 101 mmol/L (ref 98–111)
Creatinine, Ser: 0.9 mg/dL (ref 0.44–1.00)
GFR, Estimated: >60 mL/min (ref >60)
Glucose, Bld: 82 mg/dL (ref 70–99)
Potassium: 4 mmol/L (ref 3.5–5.1)
Sodium: 138 mmol/L (ref 135–145)

## 2021-02-06 LAB — PROTIME-INR
INR: 0.9 (ref 0.8–1.2)
Prothrombin Time: 12.6 seconds (ref 11.4–15.2)

## 2021-02-06 LAB — SARS CORONAVIRUS 2 (TAT 6-24 HRS): SARS Coronavirus 2: NEGATIVE

## 2021-02-06 LAB — SURGICAL PCR SCREEN
MRSA, PCR: NEGATIVE
Staphylococcus aureus: NEGATIVE

## 2021-02-06 NOTE — Progress Notes (Signed)
PCP - [name], fnp Cardiologist - denies Oncology: Arletha Pili Iruku  PPM/ICD - denies   Chest x-ray - 02/06/21 EKG - 02/06/21 Stress Test - denies ECHO - denies Cardiac Cath - denies  Sleep Study - had one 6 months ago at [facility] but was told she only had restless leg syndrome CPAP - not prescribed  No diabetes  As of today, STOP taking any diclofenac (VOLTAREN), Aspirin (unless otherwise instructed by your surgeon) Aleve, Naproxen, Ibuprofen, Motrin, Advil, Goody's, BC's, all herbal medications, fish oil, and all vitamins.  ERAS Protcol -no   COVID TEST- 02/06/21 in PAT   Anesthesia review: no  Patient denies shortness of breath, fever, cough and chest pain at PAT appointment   All instructions explained to the patient, with a verbal understanding of the material. Patient agrees to go over the instructions while at home for a better understanding. Patient also instructed to self quarantine after being tested for COVID-19. The opportunity to ask questions was provided.

## 2021-02-08 ENCOUNTER — Ambulatory Visit (HOSPITAL_COMMUNITY): Payer: Medicare Other | Admitting: Anesthesiology

## 2021-02-08 ENCOUNTER — Observation Stay (HOSPITAL_COMMUNITY)
Admission: RE | Admit: 2021-02-08 | Discharge: 2021-02-09 | Disposition: A | Discharge status: Home / Self Care | Payer: Medicare Other | Attending: Neurological Surgery | Admitting: Neurological Surgery

## 2021-02-08 ENCOUNTER — Ambulatory Visit (HOSPITAL_COMMUNITY): Payer: Medicare Other

## 2021-02-08 ENCOUNTER — Encounter (HOSPITAL_COMMUNITY): Payer: Self-pay | Admitting: Neurological Surgery

## 2021-02-08 ENCOUNTER — Encounter (HOSPITAL_COMMUNITY)
Admission: RE | Disposition: A | Discharge status: Home / Self Care | Payer: Self-pay | Source: Home / Self Care | Attending: Neurological Surgery

## 2021-02-08 ENCOUNTER — Other Ambulatory Visit: Payer: Self-pay

## 2021-02-08 DIAGNOSIS — Z79899 Other long term (current) drug therapy: Secondary | ICD-10-CM | POA: Diagnosis not present

## 2021-02-08 DIAGNOSIS — E559 Vitamin D deficiency, unspecified: Secondary | ICD-10-CM | POA: Diagnosis not present

## 2021-02-08 DIAGNOSIS — I10 Essential (primary) hypertension: Secondary | ICD-10-CM | POA: Diagnosis not present

## 2021-02-08 DIAGNOSIS — Z87891 Personal history of nicotine dependence: Secondary | ICD-10-CM | POA: Insufficient documentation

## 2021-02-08 DIAGNOSIS — Z91040 Latex allergy status: Secondary | ICD-10-CM | POA: Insufficient documentation

## 2021-02-08 DIAGNOSIS — M4802 Spinal stenosis, cervical region: Secondary | ICD-10-CM | POA: Diagnosis not present

## 2021-02-08 DIAGNOSIS — E039 Hypothyroidism, unspecified: Secondary | ICD-10-CM | POA: Insufficient documentation

## 2021-02-08 DIAGNOSIS — M542 Cervicalgia: Secondary | ICD-10-CM | POA: Diagnosis present

## 2021-02-08 DIAGNOSIS — Z419 Encounter for procedure for purposes other than remedying health state, unspecified: Secondary | ICD-10-CM

## 2021-02-08 DIAGNOSIS — Z981 Arthrodesis status: Secondary | ICD-10-CM | POA: Diagnosis not present

## 2021-02-08 HISTORY — PX: ANTERIOR CERVICAL DECOMP/DISCECTOMY FUSION: SHX1161

## 2021-02-08 SURGERY — ANTERIOR CERVICAL DECOMPRESSION/DISCECTOMY FUSION 1 LEVEL
Anesthesia: General | Site: Spine Cervical

## 2021-02-08 MED ORDER — FENTANYL CITRATE (PF) 100 MCG/2ML IJ SOLN
INTRAMUSCULAR | Status: AC
  Filled 2021-02-08: qty 2

## 2021-02-08 MED ORDER — SODIUM CHLORIDE 0.9% FLUSH
3.0000 mL | Freq: Two times a day (BID) | INTRAVENOUS | Status: DC
  Administered 2021-02-08 (×2): 3 mL via INTRAVENOUS

## 2021-02-08 MED ORDER — GABAPENTIN 300 MG PO CAPS
300.0000 mg | ORAL_CAPSULE | ORAL | Status: AC
  Administered 2021-02-08: 300 mg via ORAL
  Filled 2021-02-08: qty 1

## 2021-02-08 MED ORDER — CHLORHEXIDINE GLUCONATE CLOTH 2 % EX PADS: 6.0000 | MEDICATED_PAD | Freq: Once | CUTANEOUS | Status: DC

## 2021-02-08 MED ORDER — MIDAZOLAM HCL 5 MG/5ML IJ SOLN
INTRAMUSCULAR | Status: DC | PRN
  Administered 2021-02-08: 2 mg via INTRAVENOUS

## 2021-02-08 MED ORDER — OXYCODONE HCL 5 MG PO TABS
10.0000 mg | ORAL_TABLET | ORAL | Status: DC | PRN
  Administered 2021-02-08 – 2021-02-09 (×4): 10 mg via ORAL
  Filled 2021-02-08 (×4): qty 2

## 2021-02-08 MED ORDER — DEXAMETHASONE SODIUM PHOSPHATE 10 MG/ML IJ SOLN: 10.0000 mg | Freq: Once | INTRAMUSCULAR | Status: DC

## 2021-02-08 MED ORDER — METHOCARBAMOL 750 MG PO TABS
750.0000 mg | ORAL_TABLET | Freq: Four times a day (QID) | ORAL | Status: DC | PRN
  Administered 2021-02-08 – 2021-02-09 (×4): 750 mg via ORAL
  Filled 2021-02-08 (×4): qty 1

## 2021-02-08 MED ORDER — FENTANYL CITRATE (PF) 100 MCG/2ML IJ SOLN
25.0000 ug | INTRAMUSCULAR | Status: DC | PRN
  Administered 2021-02-08 (×2): 50 ug via INTRAVENOUS

## 2021-02-08 MED ORDER — LEVOTHYROXINE SODIUM 100 MCG PO TABS
100.0000 ug | ORAL_TABLET | Freq: Every day | ORAL | Status: DC
  Administered 2021-02-09: 100 ug via ORAL
  Filled 2021-02-08: qty 1

## 2021-02-08 MED ORDER — PHENOL 1.4 % MT LIQD: 1.0000 | OROMUCOSAL | Status: DC | PRN

## 2021-02-08 MED ORDER — VENLAFAXINE HCL ER 75 MG PO CP24
150.0000 mg | ORAL_CAPSULE | Freq: Every day | ORAL | Status: DC
  Administered 2021-02-09: 150 mg via ORAL
  Filled 2021-02-08: qty 2

## 2021-02-08 MED ORDER — OXYCODONE HCL 5 MG/5ML PO SOLN: 5.0000 mg | Freq: Once | ORAL | Status: AC | PRN

## 2021-02-08 MED ORDER — PROMETHAZINE HCL 25 MG/ML IJ SOLN: 6.2500 mg | INTRAMUSCULAR | Status: DC | PRN

## 2021-02-08 MED ORDER — ONDANSETRON HCL 4 MG PO TABS: 4.0000 mg | ORAL_TABLET | Freq: Four times a day (QID) | ORAL | Status: DC | PRN

## 2021-02-08 MED ORDER — PROPOFOL 10 MG/ML IV BOLUS
INTRAVENOUS | Status: AC
  Filled 2021-02-08: qty 40

## 2021-02-08 MED ORDER — ONDANSETRON HCL 4 MG/2ML IJ SOLN: 4.0000 mg | Freq: Four times a day (QID) | INTRAMUSCULAR | Status: DC | PRN

## 2021-02-08 MED ORDER — BUSPIRONE HCL 10 MG PO TABS
10.0000 mg | ORAL_TABLET | Freq: Three times a day (TID) | ORAL | Status: DC | PRN
  Administered 2021-02-09: 10 mg via ORAL
  Filled 2021-02-08 (×2): qty 1

## 2021-02-08 MED ORDER — DEXAMETHASONE SODIUM PHOSPHATE 4 MG/ML IJ SOLN: 4.0000 mg | Freq: Four times a day (QID) | INTRAMUSCULAR | Status: DC

## 2021-02-08 MED ORDER — BUPIVACAINE HCL (PF) 0.25 % IJ SOLN
INTRAMUSCULAR | Status: AC
  Filled 2021-02-08: qty 30

## 2021-02-08 MED ORDER — LACTATED RINGERS IV SOLN: INTRAVENOUS | Status: DC

## 2021-02-08 MED ORDER — SUGAMMADEX SODIUM 200 MG/2ML IV SOLN
INTRAVENOUS | Status: DC | PRN
  Administered 2021-02-08: 200 mg via INTRAVENOUS

## 2021-02-08 MED ORDER — SODIUM CHLORIDE 0.9 % IV SOLN
250.0000 mL | INTRAVENOUS | Status: DC
  Administered 2021-02-08: 250 mL via INTRAVENOUS

## 2021-02-08 MED ORDER — LIDOCAINE 2% (20 MG/ML) 5 ML SYRINGE
INTRAMUSCULAR | Status: DC | PRN
  Administered 2021-02-08: 60 mg via INTRAVENOUS

## 2021-02-08 MED ORDER — FENTANYL CITRATE (PF) 100 MCG/2ML IJ SOLN
INTRAMUSCULAR | Status: DC | PRN
  Administered 2021-02-08 (×4): 50 ug via INTRAVENOUS

## 2021-02-08 MED ORDER — FERROUS SULFATE 325 (65 FE) MG PO TABS
325.0000 mg | ORAL_TABLET | Freq: Every day | ORAL | Status: DC
  Administered 2021-02-08: 325 mg via ORAL
  Filled 2021-02-08: qty 1

## 2021-02-08 MED ORDER — CEFAZOLIN SODIUM-DEXTROSE 2-4 GM/100ML-% IV SOLN
2.0000 g | Freq: Three times a day (TID) | INTRAVENOUS | Status: AC
Start: 2021-02-08 — End: 2021-02-09
  Administered 2021-02-08 – 2021-02-09 (×2): 2 g via INTRAVENOUS
  Filled 2021-02-08 (×2): qty 100

## 2021-02-08 MED ORDER — ACETAMINOPHEN 500 MG PO TABS
1000.0000 mg | ORAL_TABLET | Freq: Four times a day (QID) | ORAL | Status: AC
  Administered 2021-02-08 – 2021-02-09 (×4): 1000 mg via ORAL
  Filled 2021-02-08 (×4): qty 2

## 2021-02-08 MED ORDER — METHOCARBAMOL 500 MG PO TABS
ORAL_TABLET | ORAL | Status: AC
  Filled 2021-02-08: qty 1

## 2021-02-08 MED ORDER — ROCURONIUM BROMIDE 10 MG/ML (PF) SYRINGE
PREFILLED_SYRINGE | INTRAVENOUS | Status: DC | PRN
  Administered 2021-02-08: 50 mg via INTRAVENOUS

## 2021-02-08 MED ORDER — ESTRADIOL 1 MG PO TABS
2.0000 mg | ORAL_TABLET | Freq: Every day | ORAL | Status: DC
  Administered 2021-02-08 – 2021-02-09 (×2): 2 mg via ORAL
  Filled 2021-02-08 (×2): qty 2

## 2021-02-08 MED ORDER — 0.9 % SODIUM CHLORIDE (POUR BTL) OPTIME
TOPICAL | Status: DC | PRN
  Administered 2021-02-08: 1000 mL

## 2021-02-08 MED ORDER — FENTANYL CITRATE (PF) 250 MCG/5ML IJ SOLN
INTRAMUSCULAR | Status: AC
  Filled 2021-02-08: qty 5

## 2021-02-08 MED ORDER — OXYCODONE HCL 5 MG PO TABS
ORAL_TABLET | ORAL | Status: AC
  Administered 2021-02-08: 5 mg via ORAL
  Filled 2021-02-08: qty 1

## 2021-02-08 MED ORDER — MORPHINE SULFATE (PF) 2 MG/ML IV SOLN
2.0000 mg | INTRAVENOUS | Status: DC | PRN
  Administered 2021-02-08 – 2021-02-09 (×3): 2 mg via INTRAVENOUS
  Filled 2021-02-08 (×3): qty 1

## 2021-02-08 MED ORDER — BUPROPION HCL ER (SR) 150 MG PO TB12
150.0000 mg | ORAL_TABLET | Freq: Every morning | ORAL | Status: DC
  Administered 2021-02-09: 150 mg via ORAL

## 2021-02-08 MED ORDER — METOPROLOL TARTRATE 25 MG PO TABS
50.0000 mg | ORAL_TABLET | Freq: Two times a day (BID) | ORAL | Status: DC
  Administered 2021-02-08 – 2021-02-09 (×2): 50 mg via ORAL
  Filled 2021-02-08 (×2): qty 2

## 2021-02-08 MED ORDER — THROMBIN 5000 UNITS EX SOLR
CUTANEOUS | Status: AC
  Filled 2021-02-08: qty 5000

## 2021-02-08 MED ORDER — ORAL CARE MOUTH RINSE: 15.0000 mL | Freq: Once | OROMUCOSAL | Status: AC

## 2021-02-08 MED ORDER — EPHEDRINE SULFATE 50 MG/ML IJ SOLN
INTRAMUSCULAR | Status: DC | PRN
  Administered 2021-02-08 (×2): 10 mg via INTRAVENOUS

## 2021-02-08 MED ORDER — THROMBIN 5000 UNITS EX SOLR: OROMUCOSAL | Status: DC | PRN

## 2021-02-08 MED ORDER — SENNA 8.6 MG PO TABS
1.0000 | ORAL_TABLET | Freq: Two times a day (BID) | ORAL | Status: DC
  Administered 2021-02-08 – 2021-02-09 (×2): 8.6 mg via ORAL
  Filled 2021-02-08 (×2): qty 1

## 2021-02-08 MED ORDER — PROPOFOL 10 MG/ML IV BOLUS
INTRAVENOUS | Status: DC | PRN
Start: 2021-02-08 — End: 2021-02-08
  Administered 2021-02-08: 130 mg via INTRAVENOUS

## 2021-02-08 MED ORDER — BUPIVACAINE HCL (PF) 0.25 % IJ SOLN
INTRAMUSCULAR | Status: DC | PRN
  Administered 2021-02-08: 3 mL

## 2021-02-08 MED ORDER — SODIUM CHLORIDE 0.9% FLUSH
3.0000 mL | INTRAVENOUS | Status: DC | PRN
  Administered 2021-02-08: 3 mL via INTRAVENOUS

## 2021-02-08 MED ORDER — POTASSIUM CHLORIDE IN NACL 20-0.9 MEQ/L-% IV SOLN: INTRAVENOUS | Status: DC

## 2021-02-08 MED ORDER — DEXAMETHASONE SODIUM PHOSPHATE 10 MG/ML IJ SOLN
INTRAMUSCULAR | Status: DC | PRN
  Administered 2021-02-08: 10 mg via INTRAVENOUS

## 2021-02-08 MED ORDER — OXYCODONE HCL 5 MG PO TABS: 5.0000 mg | ORAL_TABLET | Freq: Once | ORAL | Status: AC | PRN

## 2021-02-08 MED ORDER — PRAMIPEXOLE DIHYDROCHLORIDE 0.25 MG PO TABS
1.0000 mg | ORAL_TABLET | Freq: Every day | ORAL | Status: DC
  Administered 2021-02-08: 1 mg via ORAL
  Filled 2021-02-08: qty 4

## 2021-02-08 MED ORDER — ONDANSETRON HCL 4 MG/2ML IJ SOLN
INTRAMUSCULAR | Status: DC | PRN
  Administered 2021-02-08: 4 mg via INTRAVENOUS

## 2021-02-08 MED ORDER — PHENYLEPHRINE HCL-NACL 10-0.9 MG/250ML-% IV SOLN
INTRAVENOUS | Status: DC | PRN
  Administered 2021-02-08: 50 ug/min via INTRAVENOUS

## 2021-02-08 MED ORDER — CEFAZOLIN SODIUM-DEXTROSE 2-4 GM/100ML-% IV SOLN
2.0000 g | INTRAVENOUS | Status: AC
  Administered 2021-02-08: 2 g via INTRAVENOUS
  Filled 2021-02-08: qty 100

## 2021-02-08 MED ORDER — MENTHOL 3 MG MT LOZG: 1.0000 | LOZENGE | OROMUCOSAL | Status: DC | PRN

## 2021-02-08 MED ORDER — LACTATED RINGERS IV SOLN: INTRAVENOUS | Status: DC | PRN

## 2021-02-08 MED ORDER — FENOFIBRATE 54 MG PO TABS
54.0000 mg | ORAL_TABLET | Freq: Every day | ORAL | Status: DC
  Administered 2021-02-08 – 2021-02-09 (×2): 54 mg via ORAL
  Filled 2021-02-08 (×2): qty 1

## 2021-02-08 MED ORDER — DEXAMETHASONE 4 MG PO TABS
4.0000 mg | ORAL_TABLET | Freq: Four times a day (QID) | ORAL | Status: DC
  Administered 2021-02-08 – 2021-02-09 (×2): 4 mg via ORAL
  Filled 2021-02-08 (×3): qty 1

## 2021-02-08 MED ORDER — MIDAZOLAM HCL 2 MG/2ML IJ SOLN
INTRAMUSCULAR | Status: AC
  Filled 2021-02-08: qty 2

## 2021-02-08 MED ORDER — ACETAMINOPHEN 500 MG PO TABS
1000.0000 mg | ORAL_TABLET | ORAL | Status: AC
  Administered 2021-02-08: 1000 mg via ORAL
  Filled 2021-02-08: qty 2

## 2021-02-08 MED ORDER — CHLORHEXIDINE GLUCONATE 0.12 % MT SOLN
15.0000 mL | Freq: Once | OROMUCOSAL | Status: AC
  Administered 2021-02-08: 15 mL via OROMUCOSAL
  Filled 2021-02-08: qty 15

## 2021-02-08 SURGICAL SUPPLY — 42 items
BAG COUNTER SPONGE SURGICOUNT (BAG) ×2 IMPLANT
BAND RUBBER #18 3X1/16 STRL (MISCELLANEOUS) ×4 IMPLANT
BASKET BONE COLLECTION (BASKET) ×2 IMPLANT
BENZOIN TINCTURE PRP APPL 2/3 (GAUZE/BANDAGES/DRESSINGS) ×2 IMPLANT
BONE CC-ACS 11X14X7 6D (Bone Implant) ×2 IMPLANT
BUR CARBIDE MATCH 3.0 (BURR) ×2 IMPLANT
CANISTER SUCT 3000ML PPV (MISCELLANEOUS) ×2 IMPLANT
CHIPS BONE CANC-ACS11X14X7 6D (Bone Implant) ×1 IMPLANT
DRAPE C-ARM 42X72 X-RAY (DRAPES) ×4 IMPLANT
DRAPE LAPAROTOMY 100X72 PEDS (DRAPES) ×2 IMPLANT
DRAPE MICROSCOPE LEICA (MISCELLANEOUS) ×2 IMPLANT
DRSG OPSITE POSTOP 3X4 (GAUZE/BANDAGES/DRESSINGS) ×2 IMPLANT
DURAPREP 6ML APPLICATOR 50/CS (WOUND CARE) ×2 IMPLANT
ELECT COATED BLADE 2.86 ST (ELECTRODE) ×2 IMPLANT
ELECT REM PT RETURN 9FT ADLT (ELECTROSURGICAL) ×2
ELECTRODE REM PT RTRN 9FT ADLT (ELECTROSURGICAL) ×1 IMPLANT
GAUZE 4X4 16PLY ~~LOC~~+RFID DBL (SPONGE) ×2 IMPLANT
GLOVE SURG UNDER POLY LF SZ7 (GLOVE) ×4 IMPLANT
GOWN STRL REUS W/ TWL LRG LVL3 (GOWN DISPOSABLE) ×2 IMPLANT
GOWN STRL REUS W/ TWL XL LVL3 (GOWN DISPOSABLE) ×2 IMPLANT
GOWN STRL REUS W/TWL LRG LVL3 (GOWN DISPOSABLE) ×4
GOWN STRL REUS W/TWL XL LVL3 (GOWN DISPOSABLE) ×4
HEMOSTAT POWDER KIT SURGIFOAM (HEMOSTASIS) ×2 IMPLANT
KIT BASIN OR (CUSTOM PROCEDURE TRAY) ×2 IMPLANT
KIT TURNOVER KIT B (KITS) ×2 IMPLANT
NEEDLE HYPO 25X1 1.5 SAFETY (NEEDLE) ×2 IMPLANT
NEEDLE SPNL 20GX3.5 QUINCKE YW (NEEDLE) ×2 IMPLANT
NS IRRIG 1000ML POUR BTL (IV SOLUTION) ×2 IMPLANT
PACK LAMINECTOMY NEURO (CUSTOM PROCEDURE TRAY) ×2 IMPLANT
PIN DISTRACTION 14MM (PIN) ×4 IMPLANT
PLATE CERV RES ACP 16 1L (Plate) ×2 IMPLANT
PUTTY BONE 1CC (Putty) ×2 IMPLANT
RASP 3.0MM (RASP) ×2 IMPLANT
SCREW BONE VA SD 4.2X13 (Screw) ×6 IMPLANT
SCREW BONE VA ST 4.2X13 (Screw) ×2 IMPLANT
SPONGE INTESTINAL PEANUT (DISPOSABLE) ×2 IMPLANT
SPONGE T-LAP 4X18 ~~LOC~~+RFID (SPONGE) ×2 IMPLANT
STRIP CLOSURE SKIN 1/2X4 (GAUZE/BANDAGES/DRESSINGS) ×2 IMPLANT
SUT VIC AB 3-0 SH 8-18 (SUTURE) ×4 IMPLANT
TOWEL GREEN STERILE (TOWEL DISPOSABLE) ×2 IMPLANT
TOWEL GREEN STERILE FF (TOWEL DISPOSABLE) ×2 IMPLANT
WATER STERILE IRR 1000ML POUR (IV SOLUTION) ×2 IMPLANT

## 2021-02-08 NOTE — Progress Notes (Signed)
Orthopedic Tech Progress Note Patient Details:  Brittany Lynch 09/15/1960 [id number]  Ortho Devices Type of Ortho Device: Soft collar Ortho Device/Splint Location: NECK Ortho Device/Splint Interventions: Application, Ordered   Post Interventions Patient Tolerated: Well Instructions Provided: Care of device  Zamir Staples [name] 02/08/2021, 1:05 PM

## 2021-02-08 NOTE — Anesthesia Procedure Notes (Signed)
Procedure Name: Intubation Date/Time: 02/08/2021 10:17 AM Performed by: Eligha Bridegroom, CRNA Pre-anesthesia Checklist: Patient identified, Emergency Drugs available, Suction available, Patient being monitored and Timeout performed Patient Re-evaluated:Patient Re-evaluated prior to induction Oxygen Delivery Method: Circle system utilized Preoxygenation: Pre-oxygenation with 100% oxygen Induction Type: IV induction Laryngoscope Size: Mac and 3 Grade View: Grade I Tube type: Oral Tube size: 7.0 mm Number of attempts: 1 Airway Equipment and Method: Stylet Placement Confirmation: ETT inserted through vocal cords under direct vision, positive ETCO2 and breath sounds checked- equal and bilateral Secured at: 21 cm Tube secured with: Tape Dental Injury: Teeth and Oropharynx as per pre-operative assessment

## 2021-02-08 NOTE — H&P (Signed)
Subjective:   Patient is a 60 y.o. female admitted for acdf. The patient first presented to me with complaints of neck pain, shooting pains in the arm(s), and numbness of the arm(s). Onset of symptoms was several months ago. The pain is described as aching and occurs all day. The pain is rated severe, and is located in the neck and radiates to the arms. The symptoms have been progressive. Symptoms are exacerbated by extending head backwards, and are relieved by none.  Previous work up includes MRI of cervical spine, results: spinal stenosis.  Past Medical History:  Diagnosis Date   Anemia    Anxiety    Arthritis    spine/neck   Depression    GERD (gastroesophageal reflux disease)    Headache    Hyperlipidemia    Hypertension    Hypothyroidism    Thyroid disease     Past Surgical History:  Procedure Laterality Date   ABDOMINAL HYSTERECTOMY     BACK SURGERY     CATARACT EXTRACTION Bilateral    EYE SURGERY     LUMBAR LAMINECTOMY/DECOMPRESSION MICRODISCECTOMY Bilateral 03/06/2018   Procedure: Laminectomy and Foraminotomy - Lumbar Two-Three, Three-Four bilateral;  Surgeon: Eustace Moore, MD;  Location: [place];  Service: Neurosurgery;  Laterality: Bilateral;  Laminectomy and Foraminotomy - Lumbar Two-Three, Three-Four bilateral   NECK SURGERY     Right hand and wrist surgery Right    Laceration    SPINE SURGERY      Allergies  Allergen Reactions   Chocolate Shortness Of Breath and Rash   Iodinated Diagnostic Agents Anaphylaxis, Shortness Of Breath and Rash    Can tolerate with Benadryl injection    Methotrexate Derivatives Shortness Of Breath and Rash   Lisinopril Cough   Latex    Tape Rash and Other (See Comments)    Paper tape only.    Social History   Tobacco Use   Smoking status: Former    Types: Cigarettes    Quit date: 07/16/2001    Years since quitting: 19.5   Smokeless tobacco: Never  Substance Use Topics   Alcohol use: No    Comment: hx alcoholism. none since age  68    Family History  Problem Relation Age of Onset   Mental illness Mother    Alcohol abuse Father    Diabetes Father    Heart disease Father    Prior to Admission medications   Medication Sig Start Date End Date Taking? Authorizing Provider  atorvastatin (LIPITOR) 20 MG tablet Take 1 tablet (20 mg total) by mouth daily. 01/30/21  Yes Hawks, Christy A, FNP  buPROPion (WELLBUTRIN SR) 150 MG 12 hr tablet Take 1 tablet (150 mg total) by mouth daily. Patient taking differently: Take 150 mg by mouth in the morning. 08/08/20  Yes Hawks, Christy A, FNP  busPIRone (BUSPAR) 10 MG tablet TAKE ONE TABLET BY MOUTH THREE TIMES DAILY Patient taking differently: Take 10 mg by mouth 3 (three) times daily as needed (anxiety). 02/03/21  Yes Hawks, Christy A, FNP  diclofenac (VOLTAREN) 50 MG EC tablet Take 50 mg by mouth 3 (three) times daily. 11/17/20  Yes [provider]  diphenhydrAMINE (BENADRYL) 25 mg capsule Take 25 mg by mouth every 6 (six) hours as needed for itching (after neck injections).   Yes [provider]  estradiol (ESTRACE) 2 MG tablet Take 1 tablet (2 mg total) by mouth daily. Patient taking differently: Take 2 mg by mouth in the morning. 09/09/20  Yes Lenna Gilford, [place]  A, FNP  fenofibrate (TRICOR) 145 MG tablet Take 1 tablet (145 mg total) by mouth daily. 12/02/20  Yes Hawks, Christy A, FNP  ferrous sulfate 325 (65 FE) MG tablet Take 325 mg by mouth at bedtime.   Yes [provider]  levothyroxine (SYNTHROID) 100 MCG tablet Take 1 tablet (100 mcg total) by mouth daily. Patient taking differently: Take 100 mcg by mouth daily before breakfast. 11/18/20 11/18/21 Yes Hawks, Christy A, FNP  lubiprostone (AMITIZA) 24 MCG capsule TAKE ONE CAPSULE BY MOUTH ONCE DAILY FOR CONSTIPATION Patient taking differently: Take 24 mcg by mouth daily with breakfast. 11/28/20  Yes Hawks, Christy A, FNP  methocarbamol (ROBAXIN) 750 MG tablet Take 750 mg by mouth every 6 (six) hours as needed for  muscle spasms. 04/08/20  Yes [provider]  metoprolol tartrate (LOPRESSOR) 50 MG tablet TAKE ONE TABLET BY MOUTH TWICE DAILY Patient taking differently: Take 50 mg by mouth 2 (two) times daily. 02/03/21  Yes Hawks, Christy A, FNP  Multiple Vitamin (MULTIVITAMIN WITH MINERALS) TABS tablet Take 1 tablet by mouth daily. Women's 50+   Yes [provider]  omeprazole (PRILOSEC) 40 MG capsule Take 1 capsule (40 mg total) by mouth daily. Patient taking differently: Take 40 mg by mouth daily as needed (acid reflux). 01/03/21  Yes Hawks, Christy A, FNP  Oxycodone HCl 10 MG TABS Take 10 mg by mouth every 6 (six) hours as needed for severe pain.   Yes [provider]  pramipexole (MIRAPEX) 0.5 MG tablet SMARTSIG:2 Tablet(s) By Mouth Every Evening Patient taking differently: Take 1 mg by mouth at bedtime. 07/26/20  Yes Hawks, Christy A, FNP  venlafaxine XR (EFFEXOR-XR) 150 MG 24 hr capsule TAKE 1 CAPSULE BY MOUTH ONCE DAILY WITH BREAKFAST Patient taking differently: Take 150 mg by mouth daily with breakfast. 01/30/21  Yes Hawks, Christy A, FNP  cephALEXin (KEFLEX) 500 MG capsule Take 1 capsule (500 mg total) by mouth 2 (two) times daily. Patient not taking: Reported on 02/03/2021 01/19/21   Sharion Balloon, FNP     Review of Systems  Positive ROS: neg  All other systems have been reviewed and were otherwise negative with the exception of those mentioned in the HPI and as above.  Objective: Vital signs in last 24 hours:    General Appearance: Alert, cooperative, no distress, appears stated age Head: Normocephalic, without obvious abnormality, atraumatic Eyes: PERRL, conjunctiva/corneas clear, EOM's intact      Neck: Supple, symmetrical, trachea midline, Back: Symmetric, no curvature, ROM normal, no CVA tenderness Lungs:  respirations unlabored Heart: Regular rate and rhythm Abdomen: Soft, non-tender Extremities: Extremities normal, atraumatic, no cyanosis or edema Pulses:  2+ and symmetric all extremities Skin: Skin color, texture, turgor normal, no rashes or lesions  NEUROLOGIC:  Mental status: Alert and oriented x4, no aphasia, good attention span, fund of knowledge and memory  Motor Exam - grossly normal Sensory Exam - grossly normal Reflexes: 2+ Coordination - grossly normal Gait - grossly normal Balance - grossly normal Cranial Nerves: I: smell Not tested  II: visual acuity  OS: nl    OD: nl  II: visual fields Full to confrontation  II: pupils Equal, round, reactive to light  III,VII: ptosis None  III,IV,VI: extraocular muscles  Full ROM  V: mastication Normal  V: facial light touch sensation  Normal  V,VII: corneal reflex  Present  VII: facial muscle function - upper  Normal  VII: facial muscle function - lower Normal  VIII: hearing Not tested  IX: soft palate elevation  Normal  IX,X: gag reflex Present  XI: trapezius strength  5/5  XI: sternocleidomastoid strength 5/5  XI: neck flexion strength  5/5  XII: tongue strength  Normal    Data Review Lab Results  Component Value Date   WBC 7.7 02/06/2021   HGB 12.6 02/06/2021   HCT 38.3 02/06/2021   MCV 89.7 02/06/2021   PLT 469 (H) 02/06/2021   Lab Results  Component Value Date   NA 138 02/06/2021   K 4.0 02/06/2021   CL 101 02/06/2021   CO2 28 02/06/2021   BUN 14 02/06/2021   CREATININE 0.90 02/06/2021   GLUCOSE 82 02/06/2021   Lab Results  Component Value Date   INR 0.9 02/06/2021    Assessment:   Cervical neck pain with herniated nucleus pulposus/ spondylosis/ stenosis at C4-5. Estimated body mass index is 26.67 kg/m as calculated from the following:   Height as of 02/06/21: '5\' 2"'$  (1.575 m).   Weight as of 02/06/21: 66.1 kg.  Patient has failed conservative therapy. Planned surgery : ACDF C4-5  Plan:   I explained the condition and procedure to the patient and answered any questions.  Patient wishes to proceed with procedure as planned. Understands risks/ benefits/  and expected or typical outcomes.  Eustace Moore 02/08/2021 7:40 AM

## 2021-02-08 NOTE — Anesthesia Postprocedure Evaluation (Signed)
Anesthesia Post Note  Patient: Brittany Lynch  Procedure(s) Performed: CERVICAL FOUR-FIVE ANTERIOR CERVICAL DECOMPRESSION/DISCECTOMY FUSION (Spine Cervical)     Patient location during evaluation: PACU Anesthesia Type: General Level of consciousness: awake and alert Pain management: pain level controlled Vital Signs Assessment: post-procedure vital signs reviewed and stable Respiratory status: spontaneous breathing, nonlabored ventilation, respiratory function stable and patient connected to nasal cannula oxygen Cardiovascular status: blood pressure returned to baseline and stable Postop Assessment: no apparent nausea or vomiting Anesthetic complications: no   No notable events documented.  Last Vitals:  Vitals:   02/08/21 1252 02/08/21 1326  BP: (!) 122/56 (!) 143/73  Pulse: 76 64  Resp: 16 20  Temp: 36.4 C   SpO2: 96% 100%    Last Pain:  Vitals:   02/08/21 1250  TempSrc:   PainSc: 10-Worst pain ever                 Belenda Cruise P Addam Goeller

## 2021-02-08 NOTE — Op Note (Signed)
02/08/2021  12:01 PM  PATIENT:  Brittany Lynch  60 y.o. female  PRE-OPERATIVE DIAGNOSIS: Cervical spinal stenosis C4-5 with neck and arm pain  POST-OPERATIVE DIAGNOSIS:  same  PROCEDURE:  1. Decompressive anterior cervical discectomy C4-5, 2. Anterior cervical arthrodesis C4-5 utilizing a cortical cancellus allograft  bone graft, 3. Anterior cervical plating C4-5 utilizing a globus resonate plate, 4.  Removal of plate C5-6  SURGEON:  Sherley Bounds, MD  ASSISTANTS: Glenford Peers FNP  ANESTHESIA:   General  EBL: 50 ml  Total I/O In: 1000 [I.V.:1000] Out: 50 [Blood:50]  BLOOD ADMINISTERED: none  DRAINS: none  SPECIMEN:  none  INDICATION FOR PROCEDURE: This patient presented with neck and right arm pain. Imaging showed Jacelyn stenosis C4-5 above previous C5-C7 fusion. The patient tried conservative measures without relief. Pain was debilitating. Recommended ACDF with plating. Patient understood the risks, benefits, and alternatives and potential outcomes and wished to proceed.  PROCEDURE DETAILS: Patient was brought to the operating room placed under general endotracheal anesthesia. Patient was placed in the supine position on the operating room table. The neck was prepped with Duraprep and draped in a sterile fashion.   Three cc of local anesthesia was injected and a transverse incision was made on the right side of the neck.  Dissection was carried down thru the subcutaneous tissue and the platysma was  elevated, opened, and undermined with Metzenbaum scissors.  Dissection was then carried out thru an avascular plane leaving the sternocleidomastoid carotid artery and jugular vein laterally and the trachea and esophagus medially with the assistance of my nurse practitioner. The ventral aspect of the vertebral column was identified and the old plate was localized.  We dissected the soft tissue from the top of the plate.  We removed the osteophyte overgrowing the superior part of the  plate.  We unlocked the top 2 screws and remove the top 2 screws from C5.  We then used a high-speed cutting bur and cut the plate below the C5 and removed the top part of the plate.  And a localizing x-ray was taken. The C4-5 level was identified and all in the room agreed with the level. The longus colli muscles were then elevated and the retractor was placed with the assistance of my nurse practitioner. The annulus was incised and the disc space entered.  Traction pins were placed into C4 and C5 discectomy was performed with micro-curettes and pituitary rongeurs. I then used the high-speed drill to drill the endplates down to the level of the posterior longitudinal ligament. The drill shavings were saved in a mucous trap for later arthrodesis. The operating microscope was draped and brought into the field provided additional magnification, illumination and visualization. Discectomy was continued posteriorly thru the disc space. Posterior longitudinal ligament was opened with a nerve hook, and then removed along with disc herniation and osteophytes, decompressing the spinal canal and thecal sac. We then continued to remove osteophytic overgrowth and disc material decompressing the neural foramina and exiting nerve roots bilaterally. The scope was angled up and down to help decompress and undercut the vertebral bodies. Once the decompression was completed we could pass a nerve hook circumferentially to assure adequate decompression in the midline and in the neural foramina. So by both visualization and palpation we felt we had an adequate decompression of the neural elements. We then measured the height of the intravertebral disc space and selected a 7  millimeter  cortical cancellus allograft. It was then gently positioned in the intravertebral  disc space(s) and countersunk. I then used a 16 mm globus resonate plate and placed 13 mm variable angle screws into the vertebral bodies of each level and locked them into  position. The wound was irrigated with bacitracin solution, checked for hemostasis which was established and confirmed. Once meticulous hemostasis was achieved, we then proceeded with closure with the assistance of my nurse practitioner. The platysma was closed with interrupted 3-0 undyed Vicryl suture, the subcuticular layer was closed with interrupted 3-0 undyed Vicryl suture. The skin edges were approximated with steristrips. The drapes were removed. A sterile dressing was applied. The patient was then awakened from general anesthesia and transferred to the recovery room in stable condition. At the end of the procedure all sponge, needle and instrument counts were correct.   PLAN OF CARE: Admit for overnight observation  PATIENT DISPOSITION:  PACU - hemodynamically stable.   Delay start of Pharmacological VTE agent (>24hrs) due to surgical blood loss or risk of bleeding:  yes

## 2021-02-08 NOTE — Anesthesia Preprocedure Evaluation (Signed)
Anesthesia Evaluation  Patient identified by MRN, date of birth, ID band Patient awake    Reviewed: Allergy & Precautions, NPO status , Patient's Chart, lab work & pertinent test results  Airway Mallampati: II  TM Distance: >3 FB Neck ROM: Full    Dental  (+) Teeth Intact   Pulmonary neg pulmonary ROS, former smoker,    Pulmonary exam normal        Cardiovascular Pt. on medications and Pt. on home beta blockers  Rhythm:Regular Rate:Normal     Neuro/Psych  Headaches, Anxiety Depression    GI/Hepatic Neg liver ROS, GERD  Medicated,  Endo/Other  Hypothyroidism   Renal/GU negative Renal ROS  negative genitourinary   Musculoskeletal  (+) Arthritis , Osteoarthritis,  Cervical stenosis   Abdominal (+)  Abdomen: soft. Bowel sounds: normal.  Peds  Hematology  (+) anemia ,   Anesthesia Other Findings   Reproductive/Obstetrics                             Anesthesia Physical Anesthesia Plan  ASA: 2  Anesthesia Plan: General   Post-op Pain Management:    Induction: Intravenous  PONV Risk Score and Plan: 3 and Ondansetron, Dexamethasone, Midazolam and Treatment may vary due to age or medical condition  Airway Management Planned: Mask and Oral ETT  Additional Equipment: None  Intra-op Plan:   Post-operative Plan: Extubation in OR  Informed Consent: I have reviewed the patients History and Physical, chart, labs and discussed the procedure including the risks, benefits and alternatives for the proposed anesthesia with the patient or authorized representative who has indicated his/her understanding and acceptance.     Dental advisory given  Plan Discussed with: CRNA  Anesthesia Plan Comments: (Lab Results      Component                Value               Date                      WBC                      7.7                 02/06/2021                HGB                      12.6                 02/06/2021                HCT                      38.3                02/06/2021                MCV                      89.7                02/06/2021                PLT  469 (H)             02/06/2021           Lab Results      Component                Value               Date                      NA                       138                 02/06/2021                K                        4.0                 02/06/2021                CO2                      28                  02/06/2021                GLUCOSE                  82                  02/06/2021                BUN                      14                  02/06/2021                CREATININE               0.90                02/06/2021                CALCIUM                  9.4                 02/06/2021                GFRNONAA                 >60                 02/06/2021                GFRAA                    90                  07/26/2020          )        Anesthesia Quick Evaluation

## 2021-02-08 NOTE — Progress Notes (Signed)
Orthopedic Tech Progress Note Patient Details:  Brittany Lynch 03-20-1961 [id number]  Ortho Devices Type of Ortho Device: Soft collar Ortho Device/Splint Location: NECK Ortho Device/Splint Interventions: Ordered, Application   Post Interventions Patient Tolerated: Well Instructions Provided: Care of device  [name] 02/08/2021, 12:42 PM

## 2021-02-08 NOTE — Transfer of Care (Signed)
Immediate Anesthesia Transfer of Care Note  Patient: Brittany Lynch  Procedure(s) Performed: CERVICAL FOUR-FIVE ANTERIOR CERVICAL DECOMPRESSION/DISCECTOMY FUSION (Spine Cervical)  Patient Location: PACU  Anesthesia Type:General  Level of Consciousness: awake, alert  and oriented  Airway & Oxygen Therapy: Patient Spontanous Breathing and Patient connected to face mask oxygen  Post-op Assessment: Report given to RN and Post -op Vital signs reviewed and stable  Post vital signs: Reviewed and stable  Last Vitals:  Vitals Value Taken Time  BP 105/61 02/08/21 1209  Temp    Pulse 78 02/08/21 1214  Resp 14 02/08/21 1214  SpO2 96 % 02/08/21 1214  Vitals shown include unvalidated device data.  Last Pain:  Vitals:   02/08/21 0818  TempSrc:   PainSc: 10-Worst pain ever         Complications: No notable events documented.

## 2021-02-09 ENCOUNTER — Encounter (HOSPITAL_COMMUNITY): Payer: Self-pay | Admitting: Neurological Surgery

## 2021-02-09 DIAGNOSIS — E039 Hypothyroidism, unspecified: Secondary | ICD-10-CM | POA: Diagnosis not present

## 2021-02-09 DIAGNOSIS — I10 Essential (primary) hypertension: Secondary | ICD-10-CM | POA: Diagnosis not present

## 2021-02-09 DIAGNOSIS — Z79899 Other long term (current) drug therapy: Secondary | ICD-10-CM | POA: Diagnosis not present

## 2021-02-09 DIAGNOSIS — M4802 Spinal stenosis, cervical region: Secondary | ICD-10-CM | POA: Diagnosis not present

## 2021-02-09 DIAGNOSIS — Z91040 Latex allergy status: Secondary | ICD-10-CM | POA: Diagnosis not present

## 2021-02-09 DIAGNOSIS — Z87891 Personal history of nicotine dependence: Secondary | ICD-10-CM | POA: Diagnosis not present

## 2021-02-09 NOTE — Plan of Care (Signed)
Adequately ready for discharge 

## 2021-02-09 NOTE — Evaluation (Signed)
Occupational Therapy Evaluation Patient Details Name: Brittany Lynch MRN: [id number] DOB: [birth year] Today's Date: 02/09/2021    History of Present Illness 60 yo female s/p ACDF C4-5. PMH including anxiety, anemia, arthritis, HTN, hypothyroidism, and back sx.   Clinical Impression   PTA, pt was living with her husband and was independent. Currently, pt performing ADLs and functional mobility at Mod I level. Provided education on cervical precautions, collar management, grooming, UB ADLs, LB ADLs, toileting, stair management, and shower transfer; pt demonstrated understanding. Answered all pt questions. Recommend dc home once medically stable per physician. All acute OT needs met and will sign off. Thank you.    Follow Up Recommendations  No OT follow up    Equipment Recommendations  None recommended by OT    Recommendations for Other Services       Precautions / Restrictions Precautions Precautions: Cervical Precaution Booklet Issued: Yes (comment) Precaution Comments: Providing education and handout for cervical precautions and compensatory techniques Required Braces or Orthoses: Cervical Brace Cervical Brace: Soft collar      Mobility Bed Mobility Overal bed mobility: Modified Independent             General bed mobility comments: log roll    Transfers Overall transfer level: Independent                    Balance Overall balance assessment: No apparent balance deficits (not formally assessed)                                         ADL either performed or assessed with clinical judgement   ADL Overall ADL's : Modified independent                                       General ADL Comments: Providing education and handout for cervical precuations, bed mobility, brace management, UB ADLs, grooming, LB ADLs, toileting, shower transfer, and stair management.     Vision         Perception     Praxis       Pertinent Vitals/Pain Pain Assessment: Faces Faces Pain Scale: No hurt Pain Intervention(s): Monitored during session     Hand Dominance     Extremity/Trunk Assessment Upper Extremity Assessment Upper Extremity Assessment: Overall WFL for tasks assessed   Lower Extremity Assessment Lower Extremity Assessment: Overall WFL for tasks assessed   Cervical / Trunk Assessment Cervical / Trunk Assessment: Other exceptions Cervical / Trunk Exceptions: Cervical sx   Communication Communication Communication: No difficulties   Cognition Arousal/Alertness: Awake/alert Behavior During Therapy: WFL for tasks assessed/performed Overall Cognitive Status: Within Functional Limits for tasks assessed                                 General Comments: Moving quickly; feel this is baseline   General Comments  husband present    Exercises     Shoulder Instructions      Home Living Family/patient expects to be discharged to:: Private residence Living Arrangements: Spouse/significant other Available Help at Discharge: Family;Available PRN/intermittently Type of Home: House Home Access: Stairs to enter [employer] of Steps: 2 Entrance Stairs-Rails: None Home Layout: One level     Bathroom Shower/Tub: Walk-in shower;Tub/shower unit  Bathroom Toilet: Standard     Home Equipment: Civil engineer, contracting - built in          Prior Functioning/Environment Level of Independence: Independent                 OT Problem List: Decreased knowledge of use of DME or AE;Decreased knowledge of precautions;Pain      OT Treatment/Interventions:      OT Goals(Current goals can be found in the care plan section) Acute Rehab OT Goals Patient Stated Goal: Go home OT Goal Formulation: All assessment and education complete, DC therapy  OT Frequency:     Barriers to D/C:            Co-evaluation              AM-PAC OT "6 Clicks" Daily Activity     Outcome  Measure Help from another person eating meals?: None Help from another person taking care of personal grooming?: None Help from another person toileting, which includes using toliet, bedpan, or urinal?: None Help from another person bathing (including washing, rinsing, drying)?: None Help from another person to put on and taking off regular upper body clothing?: None Help from another person to put on and taking off regular lower body clothing?: None 6 Click Score: 24   End of Session Equipment Utilized During Treatment: Cervical collar Nurse Communication: Mobility status  Activity Tolerance: Patient tolerated treatment well Patient left: in bed;with call bell/phone within reach;with family/visitor present  OT Visit Diagnosis: Unsteadiness on feet (R26.81);Other abnormalities of gait and mobility (R26.89)                Time: 7416-3845 OT Time Calculation (min): 14 min Charges:  OT General Charges $OT Visit: 1 Visit OT Evaluation $OT Eval Low Complexity: 1 Low  Hero Mccathern MSOT, OTR/L Acute Rehab Pager: [phone] Office: [place] 02/09/2021, 8:26 AM

## 2021-02-09 NOTE — Discharge Summary (Signed)
Physician Discharge Summary  Patient ID: RITHVIKA KOFRON MRN: [id number] DOB/AGE: 1960/09/21 60 y.o.  Admit date: 02/08/2021 Discharge date: 02/09/2021  Admission Diagnoses: Cervical spinal stenosis C4-5 with neck and arm pain     Discharge Diagnoses: same   Discharged Condition: good  Hospital Course: The patient was admitted on 02/08/2021 and taken to the operating room where the patient underwent acdf C4-5. The patient tolerated the procedure well and was taken to the recovery room and then to the floor in stable condition. The hospital course was routine. There were no complications. The wound remained clean dry and intact. Pt had appropriate neck soreness. No complaints of arm pain or new N/T/W. The patient remained afebrile with stable vital signs, and tolerated a regular diet. The patient continued to increase activities, and pain was well controlled with oral pain medications.   Consults: None  Significant Diagnostic Studies:  Results for orders placed or performed during the hospital encounter of 02/06/21  Surgical pcr screen   Specimen: Nasal Mucosa; Nasal Swab  Result Value Ref Range   MRSA, PCR NEGATIVE NEGATIVE   Staphylococcus aureus NEGATIVE NEGATIVE  SARS CORONAVIRUS 2 (TAT 6-24 HRS) Nasopharyngeal Nasopharyngeal Swab   Specimen: Nasopharyngeal Swab  Result Value Ref Range   SARS Coronavirus 2 NEGATIVE NEGATIVE  CBC WITH DIFFERENTIAL  Result Value Ref Range   WBC 7.7 4.0 - 10.5 K/uL   RBC 4.27 3.87 - 5.11 MIL/uL   Hemoglobin 12.6 12.0 - 15.0 g/dL   HCT 38.3 36.0 - 46.0 %   MCV 89.7 80.0 - 100.0 fL   MCH 29.5 26.0 - 34.0 pg   MCHC 32.9 30.0 - 36.0 g/dL   RDW 14.2 11.5 - 15.5 %   Platelets 469 (H) 150 - 400 K/uL   nRBC 0.0 0.0 - 0.2 %   Neutrophils Relative % 50 %   Neutro Abs 3.8 1.7 - 7.7 K/uL   Lymphocytes Relative 40 %   Lymphs Abs 3.1 0.7 - 4.0 K/uL   Monocytes Relative 8 %   Monocytes Absolute 0.6 0.1 - 1.0 K/uL   Eosinophils Relative 1 %    Eosinophils Absolute 0.1 0.0 - 0.5 K/uL   Basophils Relative 1 %   Basophils Absolute 0.1 0.0 - 0.1 K/uL   Immature Granulocytes 0 %   Abs Immature Granulocytes 0.02 0.00 - 0.07 K/uL  Basic metabolic panel  Result Value Ref Range   Sodium 138 135 - 145 mmol/L   Potassium 4.0 3.5 - 5.1 mmol/L   Chloride 101 98 - 111 mmol/L   CO2 28 22 - 32 mmol/L   Glucose, Bld 82 70 - 99 mg/dL   BUN 14 6 - 20 mg/dL   Creatinine, Ser 0.90 0.44 - 1.00 mg/dL   Calcium 9.4 8.9 - 10.3 mg/dL   GFR, Estimated >60 >60 mL/min   Anion gap 9 5 - 15  Protime-INR  Result Value Ref Range   Prothrombin Time 12.6 11.4 - 15.2 seconds   INR 0.9 0.8 - 1.2    Chest 2 View  Result Date: 02/07/2021 CLINICAL DATA:  Preop for cervical fusion. EXAM: CHEST - 2 VIEW COMPARISON:  02/27/2018. FINDINGS: The heart size and mediastinal contours are within normal limits. Both lungs are clear. Prior cervical spine fusion. Visualized hardware intact. IMPRESSION: No acute cardiopulmonary disease. Electronically Signed   By: Marcello Moores  Register   On: 02/07/2021 06:13   DG Cervical Spine 2 or 3 views  Result Date: 02/08/2021 CLINICAL DATA:  Surgery, elective  Z41.9 (ICD-10-CM). Provided fluoroscopy time 6 seconds (0.47 mGy). EXAM: CERVICAL SPINE - 2-3 VIEW; DG C-ARM 1-60 MIN COMPARISON:  Cervical spine MRI 11/10/2020. FINDINGS: A single lateral view intraoperative fluoroscopic image of the cervical spine is submitted. On the provided image, ACDF hardware is now present at the C4-C5 level (ventral plate and screws, as well as interbody device). Overlying retractors. Partially visualized ventral fusion hardware more caudally within the cervical spine. Partially visualized ET tube. IMPRESSION: Single lateral view intraoperative fluoroscopic image of the cervical spine from C4-C5 ACDF, as described. Electronically Signed   By: Kellie Simmering DO   On: 02/08/2021 13:27   DG C-Arm 1-60 Min  Result Date: 02/08/2021 CLINICAL DATA:  Surgery, elective  Z41.9 (ICD-10-CM). Provided fluoroscopy time 6 seconds (0.47 mGy). EXAM: CERVICAL SPINE - 2-3 VIEW; DG C-ARM 1-60 MIN COMPARISON:  Cervical spine MRI 11/10/2020. FINDINGS: A single lateral view intraoperative fluoroscopic image of the cervical spine is submitted. On the provided image, ACDF hardware is now present at the C4-C5 level (ventral plate and screws, as well as interbody device). Overlying retractors. Partially visualized ventral fusion hardware more caudally within the cervical spine. Partially visualized ET tube. IMPRESSION: Single lateral view intraoperative fluoroscopic image of the cervical spine from C4-C5 ACDF, as described. Electronically Signed   By: Kellie Simmering DO   On: 02/08/2021 13:27   MS DIGITAL SCREENING TOMO BILATERAL  Result Date: 01/26/2021 CLINICAL DATA:  Screening. EXAM: DIGITAL SCREENING BILATERAL MAMMOGRAM WITH TOMOSYNTHESIS AND CAD TECHNIQUE: Bilateral screening digital craniocaudal and mediolateral oblique mammograms were obtained. Bilateral screening digital breast tomosynthesis was performed. The images were evaluated with computer-aided detection. COMPARISON:  Previous exam(s). ACR Breast Density Category b: There are scattered areas of fibroglandular density. FINDINGS: In the right breast, a possible asymmetry warrants further evaluation. In the left breast, no findings suspicious for malignancy. IMPRESSION: Further evaluation is suggested for possible asymmetry in the right breast. RECOMMENDATION: Diagnostic mammogram and possibly ultrasound of the right breast. (Code:[id number]) The patient will be contacted regarding the findings, and additional imaging will be scheduled. BI-RADS CATEGORY  0: Incomplete. Need additional imaging evaluation and/or prior mammograms for comparison. Electronically Signed   By: Dorise Bullion III M.D   On: 01/26/2021 16:31    Antibiotics:  Anti-infectives (From admission, onward)    Start     Dose/Rate Route Frequency Ordered Stop    02/08/21 1800  ceFAZolin (ANCEF) IVPB 2g/100 mL premix        2 g 200 mL/hr over 30 Minutes Intravenous Every 8 hours 02/08/21 1331 02/09/21 0133   02/08/21 0800  ceFAZolin (ANCEF) IVPB 2g/100 mL premix        2 g 200 mL/hr over 30 Minutes Intravenous On call to O.R. 02/08/21 0752 02/08/21 1000       Discharge Exam: Blood pressure 133/61, pulse (!) 59, temperature 98 F (36.7 C), temperature source Oral, resp. rate 20, height '5\' 2"'$  (1.575 m), weight 66.1 kg, SpO2 97 %. Neurologic: Grossly normal Ambulating and voiding well, incision cdi   Discharge Medications:   Allergies as of 02/09/2021       Reactions   Chocolate Shortness Of Breath, Rash   Iodinated Diagnostic Agents Anaphylaxis, Shortness Of Breath, Rash   Can tolerate with Benadryl injection   Methotrexate Derivatives Shortness Of Breath, Rash   Lisinopril Cough   Latex    Tape Rash, Other (See Comments)   Paper tape only.        Medication List  STOP taking these medications    cephALEXin 500 MG capsule Commonly known as: KEFLEX       TAKE these medications    atorvastatin 20 MG tablet Commonly known as: LIPITOR Take 1 tablet (20 mg total) by mouth daily.   buPROPion 150 MG 12 hr tablet Commonly known as: WELLBUTRIN SR Take 1 tablet (150 mg total) by mouth daily. What changed: when to take this   busPIRone 10 MG tablet Commonly known as: BUSPAR TAKE ONE TABLET BY MOUTH THREE TIMES DAILY What changed:  when to take this reasons to take this   diclofenac 50 MG EC tablet Commonly known as: VOLTAREN Take 50 mg by mouth 3 (three) times daily.   diphenhydrAMINE 25 mg capsule Commonly known as: BENADRYL Take 25 mg by mouth every 6 (six) hours as needed for itching (after neck injections).   estradiol 2 MG tablet Commonly known as: ESTRACE Take 1 tablet (2 mg total) by mouth daily. What changed: when to take this   fenofibrate 145 MG tablet Commonly known as: TRICOR Take 1 tablet (145 mg  total) by mouth daily.   ferrous sulfate 325 (65 FE) MG tablet Take 325 mg by mouth at bedtime.   levothyroxine 100 MCG tablet Commonly known as: Synthroid Take 1 tablet (100 mcg total) by mouth daily. What changed: when to take this   lubiprostone 24 MCG capsule Commonly known as: AMITIZA TAKE ONE CAPSULE BY MOUTH ONCE DAILY FOR CONSTIPATION What changed:  how much to take how to take this when to take this additional instructions   methocarbamol 750 MG tablet Commonly known as: ROBAXIN Take 750 mg by mouth every 6 (six) hours as needed for muscle spasms.   metoprolol tartrate 50 MG tablet Commonly known as: LOPRESSOR TAKE ONE TABLET BY MOUTH TWICE DAILY   multivitamin with minerals Tabs tablet Take 1 tablet by mouth daily. Women's 50+   omeprazole 40 MG capsule Commonly known as: PRILOSEC Take 1 capsule (40 mg total) by mouth daily. What changed:  when to take this reasons to take this   Oxycodone HCl 10 MG Tabs Take 10 mg by mouth every 6 (six) hours as needed for severe pain.   pramipexole 0.5 MG tablet Commonly known as: MIRAPEX SMARTSIG:2 Tablet(s) By Mouth Every Evening What changed:  how much to take how to take this when to take this additional instructions   venlafaxine XR 150 MG 24 hr capsule Commonly known as: EFFEXOR-XR TAKE 1 CAPSULE BY MOUTH ONCE DAILY WITH BREAKFAST        Disposition: home   Final Dx: acdf C4-5  Discharge Instructions      Remove dressing in 72 hours   Complete by: As directed    Call MD for:  difficulty breathing, headache or visual disturbances   Complete by: As directed    Call MD for:  hives   Complete by: As directed    Call MD for:  persistant nausea and vomiting   Complete by: As directed    Call MD for:  redness, tenderness, or signs of infection (pain, swelling, redness, odor or green/yellow discharge around incision site)   Complete by: As directed    Call MD for:  severe uncontrolled pain   Complete  by: As directed    Call MD for:  temperature >100.4   Complete by: As directed    Diet - low sodium heart healthy   Complete by: As directed    Driving Restrictions   Complete  by: As directed    No driving for 2 weeks, no riding in the car for 1 week   Increase activity slowly   Complete by: As directed    Lifting restrictions   Complete by: As directed    No lifting more than 8 lbs        Follow-up Information     Eustace Moore, MD Follow up.   Specialty: Neurosurgery Contact information: [address] [place] [address] [phone]                  Signed: Ocie Cornfield [facility] 02/09/2021, 7:57 AM

## 2021-02-09 NOTE — Progress Notes (Signed)
Patient alert and oriented, voiding adequately, MAE well with no difficulty. Incision area cdi with no s/s of infection. Patient discharged home per order. Patient and husband stated understanding of discharge instructions given. Patient has an appointment with Dr. Ronnald Ramp

## 2021-02-21 ENCOUNTER — Other Ambulatory Visit: Payer: Self-pay | Admitting: Family

## 2021-02-22 ENCOUNTER — Other Ambulatory Visit: Payer: Self-pay | Admitting: Family

## 2021-02-22 DIAGNOSIS — R928 Other abnormal and inconclusive findings on diagnostic imaging of breast: Secondary | ICD-10-CM

## 2021-02-24 ENCOUNTER — Ambulatory Visit
Admission: RE | Admit: 2021-02-24 | Discharge: 2021-02-24 | Disposition: A | Discharge status: Home / Self Care | Payer: Medicare Other | Source: Ambulatory Visit | Attending: Physician Assistant | Admitting: Physician Assistant

## 2021-02-24 ENCOUNTER — Other Ambulatory Visit: Payer: Self-pay

## 2021-02-24 ENCOUNTER — Ambulatory Visit: Payer: Medicare Other

## 2021-02-24 DIAGNOSIS — R928 Other abnormal and inconclusive findings on diagnostic imaging of breast: Secondary | ICD-10-CM

## 2021-02-24 DIAGNOSIS — N6489 Other specified disorders of breast: Secondary | ICD-10-CM | POA: Diagnosis not present

## 2021-02-24 DIAGNOSIS — R922 Inconclusive mammogram: Secondary | ICD-10-CM | POA: Diagnosis not present

## 2021-03-09 DIAGNOSIS — M542 Cervicalgia: Secondary | ICD-10-CM | POA: Diagnosis not present

## 2021-03-09 DIAGNOSIS — Z9889 Other specified postprocedural states: Secondary | ICD-10-CM | POA: Diagnosis not present

## 2021-03-29 ENCOUNTER — Other Ambulatory Visit: Payer: Self-pay | Admitting: Family

## 2021-03-29 DIAGNOSIS — E785 Hyperlipidemia, unspecified: Secondary | ICD-10-CM

## 2021-03-29 DIAGNOSIS — F411 Generalized anxiety disorder: Secondary | ICD-10-CM

## 2021-03-29 DIAGNOSIS — G2581 Restless legs syndrome: Secondary | ICD-10-CM

## 2021-03-29 DIAGNOSIS — F331 Major depressive disorder, recurrent, moderate: Secondary | ICD-10-CM

## 2021-03-30 DIAGNOSIS — M5416 Radiculopathy, lumbar region: Secondary | ICD-10-CM | POA: Diagnosis not present

## 2021-03-30 DIAGNOSIS — M5412 Radiculopathy, cervical region: Secondary | ICD-10-CM | POA: Diagnosis not present

## 2021-04-21 ENCOUNTER — Inpatient Hospital Stay (HOSPITAL_COMMUNITY): Payer: Medicare Other | Attending: Hematology

## 2021-04-21 DIAGNOSIS — E785 Hyperlipidemia, unspecified: Secondary | ICD-10-CM | POA: Diagnosis not present

## 2021-04-21 DIAGNOSIS — D75839 Thrombocytosis, unspecified: Secondary | ICD-10-CM

## 2021-04-21 DIAGNOSIS — E039 Hypothyroidism, unspecified: Secondary | ICD-10-CM | POA: Insufficient documentation

## 2021-04-21 DIAGNOSIS — Z79899 Other long term (current) drug therapy: Secondary | ICD-10-CM | POA: Diagnosis not present

## 2021-04-21 DIAGNOSIS — I10 Essential (primary) hypertension: Secondary | ICD-10-CM | POA: Insufficient documentation

## 2021-04-21 DIAGNOSIS — Z87891 Personal history of nicotine dependence: Secondary | ICD-10-CM | POA: Diagnosis not present

## 2021-04-21 DIAGNOSIS — D509 Iron deficiency anemia, unspecified: Secondary | ICD-10-CM | POA: Diagnosis not present

## 2021-04-21 DIAGNOSIS — Z23 Encounter for immunization: Secondary | ICD-10-CM | POA: Insufficient documentation

## 2021-04-21 DIAGNOSIS — K219 Gastro-esophageal reflux disease without esophagitis: Secondary | ICD-10-CM | POA: Diagnosis not present

## 2021-04-21 LAB — CBC WITH DIFFERENTIAL/PLATELET
Abs Immature Granulocytes: 0.02 10*3/uL (ref 0.00–0.07)
Basophils Absolute: 0.1 10*3/uL (ref 0.0–0.1)
Basophils Relative: 1 %
Eosinophils Absolute: 0.2 10*3/uL (ref 0.0–0.5)
Eosinophils Relative: 3 %
HCT: 40.1 % (ref 36.0–46.0)
Hemoglobin: 13.1 g/dL (ref 12.0–15.0)
Immature Granulocytes: 0 %
Lymphocytes Relative: 36 %
Lymphs Abs: 2.6 10*3/uL (ref 0.7–4.0)
MCH: 29.8 pg (ref 26.0–34.0)
MCHC: 32.7 g/dL (ref 30.0–36.0)
MCV: 91.1 fL (ref 80.0–100.0)
Monocytes Absolute: 0.6 10*3/uL (ref 0.1–1.0)
Monocytes Relative: 9 %
Neutro Abs: 3.7 10*3/uL (ref 1.7–7.7)
Neutrophils Relative %: 51 %
Platelets: 421 10*3/uL — ABNORMAL HIGH (ref 150–400)
RBC: 4.4 MIL/uL (ref 3.87–5.11)
RDW: 13.1 % (ref 11.5–15.5)
WBC: 7.2 10*3/uL (ref 4.0–10.5)
nRBC: 0 % (ref 0.0–0.2)

## 2021-04-21 LAB — COMPREHENSIVE METABOLIC PANEL
ALT: 18 U/L (ref 0–44)
AST: 20 U/L (ref 15–41)
Albumin: 4.5 g/dL (ref 3.5–5.0)
Alkaline Phosphatase: 45 U/L (ref 38–126)
Anion gap: 7 (ref 5–15)
BUN: 13 mg/dL (ref 6–20)
CO2: 29 mmol/L (ref 22–32)
Calcium: 9 mg/dL (ref 8.9–10.3)
Chloride: 104 mmol/L (ref 98–111)
Creatinine, Ser: 0.78 mg/dL (ref 0.44–1.00)
GFR, Estimated: >60 mL/min (ref >60)
Glucose, Bld: 71 mg/dL (ref 70–99)
Potassium: 4 mmol/L (ref 3.5–5.1)
Sodium: 140 mmol/L (ref 135–145)
Total Bilirubin: 0.7 mg/dL (ref 0.3–1.2)
Total Protein: 7.6 g/dL (ref 6.5–8.1)

## 2021-04-24 ENCOUNTER — Other Ambulatory Visit (HOSPITAL_COMMUNITY): Payer: Self-pay | Admitting: Physician Assistant

## 2021-04-24 DIAGNOSIS — D509 Iron deficiency anemia, unspecified: Secondary | ICD-10-CM

## 2021-04-25 ENCOUNTER — Inpatient Hospital Stay (HOSPITAL_COMMUNITY): Payer: Medicare Other

## 2021-04-25 ENCOUNTER — Other Ambulatory Visit: Payer: Self-pay

## 2021-04-25 DIAGNOSIS — I10 Essential (primary) hypertension: Secondary | ICD-10-CM | POA: Diagnosis not present

## 2021-04-25 DIAGNOSIS — Z79899 Other long term (current) drug therapy: Secondary | ICD-10-CM | POA: Diagnosis not present

## 2021-04-25 DIAGNOSIS — D509 Iron deficiency anemia, unspecified: Secondary | ICD-10-CM

## 2021-04-25 DIAGNOSIS — Z87891 Personal history of nicotine dependence: Secondary | ICD-10-CM | POA: Diagnosis not present

## 2021-04-25 DIAGNOSIS — E785 Hyperlipidemia, unspecified: Secondary | ICD-10-CM | POA: Diagnosis not present

## 2021-04-25 DIAGNOSIS — K219 Gastro-esophageal reflux disease without esophagitis: Secondary | ICD-10-CM | POA: Diagnosis not present

## 2021-04-25 DIAGNOSIS — E039 Hypothyroidism, unspecified: Secondary | ICD-10-CM | POA: Diagnosis not present

## 2021-04-25 DIAGNOSIS — Z23 Encounter for immunization: Secondary | ICD-10-CM | POA: Diagnosis not present

## 2021-04-25 LAB — FERRITIN: Ferritin: 22 ng/mL (ref 11–307)

## 2021-04-25 LAB — IRON AND TIBC
Iron: 75 ug/dL (ref 28–170)
Saturation Ratios: 13 % (ref 10.4–31.8)
TIBC: 572 ug/dL — ABNORMAL HIGH (ref 250–450)
UIBC: 497 ug/dL

## 2021-04-27 ENCOUNTER — Other Ambulatory Visit: Payer: Self-pay | Admitting: Family

## 2021-04-27 DIAGNOSIS — N951 Menopausal and female climacteric states: Secondary | ICD-10-CM

## 2021-04-27 NOTE — Progress Notes (Addendum)
Brittany Lynch, [place] [address]   CLINIC:  Medical Oncology/Hematology  PCP:  Sharion Balloon, [place] [address] [phone]   REASON FOR VISIT:  Follow-up for iron deficiency and thrombocytosis  PRIOR THERAPY: None  CURRENT THERAPY: Ferrous sulfate 325 mg daily  INTERVAL HISTORY:  Brittany Lynch 60 y.o. female returns for routine follow-up of her iron deficiency and thrombocytosis.  She was last seen by Dr. Chryl Heck on 01/06/2021.  At today's visit, she reports feeling fair.  No recent hospitalizations, surgeries, or changes in baseline health status.  She has been taking daily iron pill for the last 3 months, but without any improvement in her symptoms.  She has experienced some side effects constipation.  She has persistent fatigue, pica cravings for ice, and restless leg symptoms.  She has not had any major bleeding events such as hematemesis, hematochezia, or melena.  She does have occasional hematuria associated with UTIs, which last occurred 2 to 3 months ago.  Regarding her thrombocytosis, she denies any prior history of blood clots or current signs or symptoms of blood clots.  She is not experience any vasomotor symptoms, Raynaud's phenomenon, or erythromelalgia.  She does note that she is itchy after a shower, but this is thought to be due to skin irritant.  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  She reports that she has occasional drenching sweats during the day and night, which may be attributable to her thyroid issues.  She has little to no energy and 100% appetite. She endorses that she is maintaining a stable weight.    REVIEW OF SYSTEMS:  Review of Systems  Constitutional:  Positive for fatigue. Negative for appetite change, chills, diaphoresis, fever and unexpected weight change.  HENT:   Positive for trouble swallowing. Negative for lump/mass and nosebleeds.   Eyes:  Negative for eye  problems.  Respiratory:  Negative for cough, hemoptysis and shortness of breath.   Cardiovascular:  Negative for chest pain, leg swelling and palpitations.  Gastrointestinal:  Positive for constipation, nausea and vomiting. Negative for abdominal pain, blood in stool and diarrhea.  Genitourinary:  Negative for hematuria.   Musculoskeletal:  Positive for back pain.  Skin: Negative.   Neurological:  Positive for headaches and numbness. Negative for dizziness and light-headedness.  Hematological:  Does not bruise/bleed easily.  Psychiatric/Behavioral:  Positive for depression. The patient is nervous/anxious.      PAST MEDICAL/SURGICAL HISTORY:  Past Medical History:  Diagnosis Date   Anemia    Anxiety    Arthritis    spine/neck   Depression    GERD (gastroesophageal reflux disease)    Headache    Hyperlipidemia    Hypertension    Hypothyroidism    Thyroid disease    Past Surgical History:  Procedure Laterality Date   ABDOMINAL HYSTERECTOMY     ANTERIOR CERVICAL DECOMP/DISCECTOMY FUSION N/A 02/08/2021   Procedure: CERVICAL FOUR-FIVE ANTERIOR CERVICAL DECOMPRESSION/DISCECTOMY FUSION;  Surgeon: Eustace Moore, MD;  Location: [facility];  Service: Neurosurgery;  Laterality: N/A;  3C   BACK SURGERY     CATARACT EXTRACTION Bilateral    EYE SURGERY     LUMBAR LAMINECTOMY/DECOMPRESSION MICRODISCECTOMY Bilateral 03/06/2018   Procedure: Laminectomy and Foraminotomy - Lumbar Two-Three, Three-Four bilateral;  Surgeon: Eustace Moore, MD;  Location: [facility];  Service: Neurosurgery;  Laterality: Bilateral;  Laminectomy and Foraminotomy - Lumbar Two-Three, Three-Four bilateral   NECK SURGERY  Right hand and wrist surgery Right    Laceration    SPINE SURGERY       SOCIAL HISTORY:  Social History   Socioeconomic History   Marital status: Legally Separated    Spouse name: Not on file   Number of children: 3   Years of education: Not on file   Highest education level: 9th grade   Occupational History   Occupation: Retired  Tobacco Use   Smoking status: Former    Types: Cigarettes    Quit date: 07/16/2001    Years since quitting: 19.7   Smokeless tobacco: Never  Vaping Use   Vaping Use: Never used  Substance and Sexual Activity   Alcohol use: No    Comment: hx alcoholism. none since age 15   Drug use: Yes    Comment: once every 2 weeks [facility])   Sexual activity: Yes  Other Topics Concern   Not on file  Social History Narrative   Lives in apartment basement of daughters home    Social Determinants of Health   Financial Resource Strain: Low Risk    Difficulty of Paying Living Expenses: Not very hard  Food Insecurity: No Food Insecurity   Worried About Charity fundraiser in the Last Year: Never true   Ran Out of Food in the Last Year: Never true  Transportation Needs: No Transportation Needs   Lack of Transportation (Medical): No   Lack of Transportation (Non-Medical): No  Physical Activity: Inactive   Days of Exercise per Week: 0 days   Minutes of Exercise per Session: 0 min  Stress: No Stress Concern Present   Feeling of Stress : Not at all  Social Connections: Moderately Isolated   Frequency of Communication with Friends and Family: More than three times a week   Frequency of Social Gatherings with Friends and Family: More than three times a week   Attends Religious Services: More than 4 times per year   Active Member of [employer] or Organizations: No   Attends Archivist Meetings: Never   Marital Status: Separated  Intimate Partner Violence: Not At Risk   Fear of Current or Ex-Partner: No   Emotionally Abused: No   Physically Abused: No   Sexually Abused: No    FAMILY HISTORY:  Family History  Problem Relation Age of Onset   Mental illness Mother    Alcohol abuse Father    Diabetes Father    Heart disease Father     CURRENT MEDICATIONS:  Outpatient Encounter Medications as of 04/28/2021  Medication Sig   atorvastatin  (LIPITOR) 20 MG tablet Take 1 tablet (20 mg total) by mouth daily.   buPROPion (WELLBUTRIN SR) 150 MG 12 hr tablet Take 1 tablet (150 mg total) by mouth daily.   busPIRone (BUSPAR) 10 MG tablet TAKE ONE TABLET BY MOUTH THREE TIMES DAILY   diclofenac (VOLTAREN) 50 MG EC tablet Take 50 mg by mouth 3 (three) times daily.   diphenhydrAMINE (BENADRYL) 25 mg capsule Take 25 mg by mouth every 6 (six) hours as needed for itching (after neck injections).   estradiol (ESTRACE) 2 MG tablet Take 1 tablet (2 mg total) by mouth daily.   fenofibrate (TRICOR) 145 MG tablet Take 1 tablet (145 mg total) by mouth daily.   ferrous sulfate 325 (65 FE) MG tablet Take 325 mg by mouth at bedtime.   levothyroxine (SYNTHROID) 100 MCG tablet Take 1 tablet (100 mcg total) by mouth daily.   lubiprostone (AMITIZA) 24 MCG  capsule TAKE ONE CAPSULE BY MOUTH ONCE DAILY FOR CONSTIPATION   methocarbamol (ROBAXIN) 750 MG tablet Take 750 mg by mouth every 6 (six) hours as needed for muscle spasms.   metoprolol tartrate (LOPRESSOR) 50 MG tablet TAKE ONE TABLET BY MOUTH TWICE DAILY   Multiple Vitamin (MULTIVITAMIN WITH MINERALS) TABS tablet Take 1 tablet by mouth daily. Women's 50+   omeprazole (PRILOSEC) 40 MG capsule Take 1 capsule (40 mg total) by mouth daily.   Oxycodone HCl 10 MG TABS Take 10 mg by mouth every 6 (six) hours as needed for severe pain.   pramipexole (MIRAPEX) 0.5 MG tablet TAKE TWO TABLETS BY MOUTH EVERY EVENING   venlafaxine XR (EFFEXOR-XR) 150 MG 24 hr capsule TAKE 1 CAPSULE BY MOUTH ONCE DAILY WITH BREAKFAST   No facility-administered encounter medications on file as of 04/28/2021.    ALLERGIES:  Allergies  Allergen Reactions   Chocolate Shortness Of Breath and Rash   Iodinated Diagnostic Agents Anaphylaxis, Shortness Of Breath and Rash    Can tolerate with Benadryl injection    Methotrexate Derivatives Shortness Of Breath and Rash   Lisinopril Cough   Latex    Tape Rash and Other (See Comments)     Paper tape only.     PHYSICAL EXAM:  ECOG PERFORMANCE STATUS: 1 - Symptomatic but completely ambulatory  There were no vitals filed for this visit. There were no vitals filed for this visit. Physical Exam Constitutional:      Appearance: Normal appearance.  HENT:     Head: Normocephalic and atraumatic.     Mouth/Throat:     Mouth: Mucous membranes are moist.  Eyes:     Extraocular Movements: Extraocular movements intact.     Pupils: Pupils are equal, round, and reactive to light.  Cardiovascular:     Rate and Rhythm: Normal rate and regular rhythm.     Pulses: Normal pulses.     Heart sounds: Normal heart sounds.  Pulmonary:     Effort: Pulmonary effort is normal.     Breath sounds: Normal breath sounds.  Abdominal:     General: Bowel sounds are normal.     Palpations: Abdomen is soft.     Tenderness: There is no abdominal tenderness.  Musculoskeletal:        General: No swelling.     Right lower leg: No edema.     Left lower leg: No edema.  Lymphadenopathy:     Cervical: No cervical adenopathy.  Skin:    General: Skin is warm and dry.  Neurological:     General: No focal deficit present.     Mental Status: She is alert and oriented to person, place, and time.  Psychiatric:        Mood and Affect: Mood normal.        Behavior: Behavior normal.     LABORATORY DATA:  I have reviewed the labs as listed.  CBC    Component Value Date/Time   WBC 7.2 04/21/2021 1104   RBC 4.40 04/21/2021 1104   HGB 13.1 04/21/2021 1104   HGB 12.1 11/17/2020 1046   HCT 40.1 04/21/2021 1104   HCT 35.3 11/17/2020 1046   PLT 421 (H) 04/21/2021 1104   PLT 536 (H) 11/17/2020 1046   MCV 91.1 04/21/2021 1104   MCV 85 11/17/2020 1046   MCH 29.8 04/21/2021 1104   MCHC 32.7 04/21/2021 1104   RDW 13.1 04/21/2021 1104   RDW 12.7 11/17/2020 1046   LYMPHSABS 2.6 04/21/2021 1104  LYMPHSABS 2.6 11/17/2020 1046   MONOABS 0.6 04/21/2021 1104   EOSABS 0.2 04/21/2021 1104   EOSABS 0.2  11/17/2020 1046   BASOSABS 0.1 04/21/2021 1104   BASOSABS 0.1 11/17/2020 1046   CMP Latest Ref Rng & Units 04/21/2021 02/06/2021 01/02/2021  Glucose 70 - 99 mg/dL 71 82 110(H)  BUN 6 - 20 mg/dL 13 14 17   Creatinine 0.44 - 1.00 mg/dL 0.78 0.90 0.69  Sodium 135 - 145 mmol/L 140 138 136  Potassium 3.5 - 5.1 mmol/L 4.0 4.0 4.6  Chloride 98 - 111 mmol/L 104 101 99  CO2 22 - 32 mmol/L 29 28 29   Calcium 8.9 - 10.3 mg/dL 9.0 9.4 9.2  Total Protein 6.5 - 8.1 g/dL 7.6 - 7.4  Total Bilirubin 0.3 - 1.2 mg/dL 0.7 - 0.2(L)  Alkaline Phos 38 - 126 U/L 45 - 35(L)  AST 15 - 41 U/L 20 - 16  ALT 0 - 44 U/L 18 - 16    DIAGNOSTIC IMAGING:  I have independently reviewed the relevant imaging and discussed with the patient.  ASSESSMENT & PLAN: 1.  Thrombocytosis - Referred to hematology for initial consult with Dr. Chryl Heck on 12/09/2020. - Review of past labs shows intermittent thrombocytosis since January 2020, with maximum platelets 536 - MPN work-up negative for JAK2, CALR, or MPL mutation.  (BCR/ABL not tested) - Other work-up revealed iron deficiency with ferritin 12 and iron saturation 9% - No previous history of blood clots, no current signs or symptoms of DVT or PE - Most recent labs (04/21/2021): Platelets 421, ferritin 22, iron saturation 13% with elevated TIBC of 572 - PLAN: We will continue repletion of IV iron as below.  Repeat CBC and iron panel with RTC in 8 weeks.  If platelets remain elevated despite iron correction, would consider further testing including BCR/ABL.  2.  Iron deficiency state without anemia - Initial work-up (12/09/2020): Iron deficiency with ferritin 12, iron saturation 9% - Patient was started on oral iron supplementation (ferrous sulfate) at her last visit (01/06/2021), which caused some constipation - Repeat iron panel (04/25/2021): Persistent iron deficiency with ferritin 22, iron saturation 13%, elevated TIBC 572 - Patient denies any signs or symptoms of blood loss such as  epistaxis, hematemesis, hematochezia, or melena.  She has occasional hematuria associated with UTI. - Suspect malabsorption, possibly due to daily PPI use  - PLAN: Due to persistent iron deficiency despite oral supplementation, as well as thrombocytosis (suspected to be reactive), recommend IV iron repletion with Feraheme x2.  Have discussed risks and benefits with patient, including the rare risk of allergic reaction.  (We will PREMEDICATE with steroids due to multiple medication allergies).  Patient verbalizes understanding and wishes to proceed with IV iron.  Repeat labs and RTC in 2 months.   PLAN SUMMARY & DISPOSITION: -IV Feraheme x2 - Labs (B12, methylmalonic acid, folate, CBC, iron) in 2 months - RTC after labs  All questions were answered. The patient knows to call the clinic with any problems, questions or concerns.  Medical decision making: Low  Time spent on visit: I spent 20 minutes counseling the patient face to face. The total time spent in the appointment was 30 minutes and more than 50% was on counseling.   Harriett Rush, PA-C  04/28/2021 4:41 PM

## 2021-04-28 ENCOUNTER — Encounter (HOSPITAL_COMMUNITY): Payer: Self-pay | Admitting: Physician Assistant

## 2021-04-28 ENCOUNTER — Other Ambulatory Visit: Payer: Self-pay

## 2021-04-28 ENCOUNTER — Inpatient Hospital Stay (HOSPITAL_BASED_OUTPATIENT_CLINIC_OR_DEPARTMENT_OTHER): Payer: Medicare Other | Admitting: Physician Assistant

## 2021-04-28 VITALS — HR 62 | Temp 97.7°F | Resp 16 | Wt 152.6 lb

## 2021-04-28 DIAGNOSIS — E611 Iron deficiency: Secondary | ICD-10-CM | POA: Diagnosis not present

## 2021-04-28 DIAGNOSIS — E039 Hypothyroidism, unspecified: Secondary | ICD-10-CM | POA: Diagnosis not present

## 2021-04-28 DIAGNOSIS — I10 Essential (primary) hypertension: Secondary | ICD-10-CM | POA: Diagnosis not present

## 2021-04-28 DIAGNOSIS — D509 Iron deficiency anemia, unspecified: Secondary | ICD-10-CM | POA: Diagnosis not present

## 2021-04-28 DIAGNOSIS — K219 Gastro-esophageal reflux disease without esophagitis: Secondary | ICD-10-CM | POA: Diagnosis not present

## 2021-04-28 DIAGNOSIS — E785 Hyperlipidemia, unspecified: Secondary | ICD-10-CM | POA: Diagnosis not present

## 2021-04-28 DIAGNOSIS — Z23 Encounter for immunization: Secondary | ICD-10-CM

## 2021-04-28 DIAGNOSIS — Z79899 Other long term (current) drug therapy: Secondary | ICD-10-CM | POA: Diagnosis not present

## 2021-04-28 DIAGNOSIS — Z87891 Personal history of nicotine dependence: Secondary | ICD-10-CM | POA: Diagnosis not present

## 2021-04-28 HISTORY — DX: Iron deficiency: E61.1

## 2021-04-28 MED ORDER — INFLUENZA VAC SPLIT QUAD 0.5 ML IM SUSY
0.5000 mL | PREFILLED_SYRINGE | Freq: Once | INTRAMUSCULAR | Status: AC
  Administered 2021-04-28: 0.5 mL via INTRAMUSCULAR
  Filled 2021-04-28: qty 0.5

## 2021-04-28 NOTE — Progress Notes (Signed)
Brittany Lynch presents today for injection per the provider's orders.  Fluarix administration without incident; injection site WNL; see MAR for injection details.  Patient tolerated procedure well and without incident and remained stable throughout visit.  No questions or complaints noted at this time. Patient discharged ambulatory with family member.

## 2021-04-28 NOTE — Patient Instructions (Signed)
[place] at [facility] Discharge Instructions  You were seen today by Tarri Abernethy PA-C for your elevated platelets and your low iron.  We checked your blood for DNA abnormalities ("genetic mutations") that would cause elevated platelets, but you do NOT have any of these DNA abnormalities.  Instead, we believe that your elevated platelets are due to your low iron.  Your body does not seem to be able to absorb iron from your stomach/intestines.  Even after taking the iron pill, your iron levels remain low.  Because of this, we recommend treatment with IV iron x2 doses.  This will improve your body's iron storage, and will hopefully improve your symptoms and your platelets as well.  LABS: Return in 8 weeks for repeat labs  OTHER TESTS: No other tests at this time  MEDICATIONS: You can STOP taking your iron pill at home, we will give you IV iron x2 doses instead.  FOLLOW-UP APPOINTMENT: Office visit in 2 months, after labs   Thank you for choosing [place] at [facility] to provide your oncology and hematology care.  To afford each patient quality time with our provider, please arrive at least 15 minutes before your scheduled appointment time.   If you have a lab appointment with the [facility] please come in thru the Main Entrance and check in at the main information desk.  You need to re-schedule your appointment should you arrive 10 or more minutes late.  We strive to give you quality time with our providers, and arriving late affects you and other patients whose appointments are after yours.  Also, if you no show three or more times for appointments you may be dismissed from the clinic at the providers discretion.     Again, thank you for choosing [facility].  Our hope is that these requests will decrease the amount of time that you wait before being seen by our physicians.        _____________________________________________________________  Should you have questions after your visit to [facility], please contact our office at [phone] and follow the prompts.  Our office hours are 8:00 a.m. and 4:30 p.m. Monday - Friday.  Please note that voicemails left after 4:00 p.m. may not be returned until the following business day.  We are closed weekends and major holidays.  You do have access to a nurse 24-7, just call the main number to the clinic [phone] and do not press any options, hold on the line and a nurse will answer the phone.    For prescription refill requests, have your pharmacy contact our office and allow 72 hours.    Due to Covid, you will need to wear a mask upon entering the hospital. If you do not have a mask, a mask will be given to you at the Main Entrance upon arrival. For doctor visits, patients may have 1 support person age 38 or older with them. For treatment visits, patients can not have anyone with them due to social distancing guidelines and our immunocompromised population.

## 2021-05-04 ENCOUNTER — Other Ambulatory Visit: Payer: Self-pay

## 2021-05-04 ENCOUNTER — Inpatient Hospital Stay (HOSPITAL_COMMUNITY): Payer: Medicare Other

## 2021-05-04 VITALS — BP 132/72 | HR 66 | Temp 96.4°F | Resp 18

## 2021-05-04 DIAGNOSIS — K219 Gastro-esophageal reflux disease without esophagitis: Secondary | ICD-10-CM | POA: Diagnosis not present

## 2021-05-04 DIAGNOSIS — Z23 Encounter for immunization: Secondary | ICD-10-CM | POA: Diagnosis not present

## 2021-05-04 DIAGNOSIS — D509 Iron deficiency anemia, unspecified: Secondary | ICD-10-CM | POA: Diagnosis not present

## 2021-05-04 DIAGNOSIS — Z79899 Other long term (current) drug therapy: Secondary | ICD-10-CM | POA: Diagnosis not present

## 2021-05-04 DIAGNOSIS — E785 Hyperlipidemia, unspecified: Secondary | ICD-10-CM | POA: Diagnosis not present

## 2021-05-04 DIAGNOSIS — E039 Hypothyroidism, unspecified: Secondary | ICD-10-CM | POA: Diagnosis not present

## 2021-05-04 DIAGNOSIS — E611 Iron deficiency: Secondary | ICD-10-CM

## 2021-05-04 DIAGNOSIS — Z87891 Personal history of nicotine dependence: Secondary | ICD-10-CM | POA: Diagnosis not present

## 2021-05-04 DIAGNOSIS — I10 Essential (primary) hypertension: Secondary | ICD-10-CM | POA: Diagnosis not present

## 2021-05-04 MED ORDER — SODIUM CHLORIDE 0.9 % IV SOLN: Freq: Once | INTRAVENOUS | Status: AC

## 2021-05-04 MED ORDER — METHYLPREDNISOLONE SODIUM SUCC 125 MG IJ SOLR
125.0000 mg | Freq: Once | INTRAMUSCULAR | Status: AC
  Administered 2021-05-04: 125 mg via INTRAVENOUS
  Filled 2021-05-04: qty 2

## 2021-05-04 MED ORDER — LORATADINE 10 MG PO TABS
10.0000 mg | ORAL_TABLET | Freq: Once | ORAL | Status: AC
  Administered 2021-05-04: 10 mg via ORAL
  Filled 2021-05-04: qty 1

## 2021-05-04 MED ORDER — SODIUM CHLORIDE 0.9 % IV SOLN
510.0000 mg | Freq: Once | INTRAVENOUS | Status: AC
  Administered 2021-05-04: 510 mg via INTRAVENOUS
  Filled 2021-05-04: qty 17

## 2021-05-04 MED ORDER — FAMOTIDINE 20 MG IN NS 100 ML IVPB
20.0000 mg | Freq: Once | INTRAVENOUS | Status: AC
  Administered 2021-05-04: 20 mg via INTRAVENOUS
  Filled 2021-05-04: qty 100

## 2021-05-04 NOTE — Progress Notes (Signed)
Pt presents today for Feraheme IV iron infusion per provider's order. Vital signs stable and pt voiced no new complaints at this time.  Peripheral IV started with good blood return pre and post infusion.  Feraheme IV iron infusion given today per MD orders. Tolerated infusion without adverse affects. Vital signs stable. No complaints at this time. Discharged from clinic ambulatory in stable condition. Alert and oriented x 3. F/U with [facility] as scheduled.

## 2021-05-04 NOTE — Patient Instructions (Signed)
[place]  Discharge Instructions: Thank you for choosing [facility] to provide your oncology and hematology care.  If you have a lab appointment with the [place] [facility], please come in thru the Main Entrance and check in at the main information desk.  Wear comfortable clothing and clothing appropriate for easy access to any Portacath or PICC line.   We strive to give you quality time with your provider. You may need to reschedule your appointment if you arrive late (15 or more minutes).  Arriving late affects you and other patients whose appointments are after yours.  Also, if you miss three or more appointments without notifying the office, you may be dismissed from the clinic at the provider's discretion.      For prescription refill requests, have your pharmacy contact our office and allow 72 hours for refills to be completed.    Today you received the Feraheme IV iron infusion.    BELOW ARE SYMPTOMS THAT SHOULD BE REPORTED IMMEDIATELY: *FEVER GREATER THAN 100.4 F (38 C) OR HIGHER *CHILLS OR SWEATING *NAUSEA AND VOMITING THAT IS NOT CONTROLLED WITH YOUR NAUSEA MEDICATION *UNUSUAL SHORTNESS OF BREATH *UNUSUAL BRUISING OR BLEEDING *URINARY PROBLEMS (pain or burning when urinating, or frequent urination) *BOWEL PROBLEMS (unusual diarrhea, constipation, pain near the anus) TENDERNESS IN MOUTH AND THROAT WITH OR WITHOUT PRESENCE OF ULCERS (sore throat, sores in mouth, or a toothache) UNUSUAL RASH, SWELLING OR PAIN  UNUSUAL VAGINAL DISCHARGE OR ITCHING   Items with * indicate a potential emergency and should be followed up as soon as possible or go to the Emergency Department if any problems should occur.  Please show the CHEMOTHERAPY ALERT CARD or IMMUNOTHERAPY ALERT CARD at check-in to the Emergency Department and triage nurse.  Should you have questions after your visit or need to cancel or reschedule your appointment, please contact [facility]  [phone]  and follow the prompts.  Office hours are 8:00 a.m. to 4:30 p.m. Monday - Friday. Please note that voicemails left after 4:00 p.m. may not be returned until the following business day.  We are closed weekends and major holidays. You have access to a nurse at all times for urgent questions. Please call the main number to the clinic [phone] and follow the prompts.  For any non-urgent questions, you may also contact your provider using MyChart. We now offer e-Visits for anyone 42 and older to request care online for non-urgent symptoms. For details visit [email or web address].   Also download the MyChart app! Go to the app store, search "MyChart", open the app, select , and log in with your MyChart username and password.  Due to Covid, a mask is required upon entering the hospital/clinic. If you do not have a mask, one will be given to you upon arrival. For doctor visits, patients may have 1 support person aged 19 or older with them. For treatment visits, patients cannot have anyone with them due to current Covid guidelines and our immunocompromised population.

## 2021-05-12 ENCOUNTER — Other Ambulatory Visit: Payer: Self-pay

## 2021-05-12 ENCOUNTER — Inpatient Hospital Stay (HOSPITAL_COMMUNITY): Payer: Medicare Other

## 2021-05-12 VITALS — BP 148/71 | HR 60 | Temp 96.6°F | Resp 18

## 2021-05-12 DIAGNOSIS — E039 Hypothyroidism, unspecified: Secondary | ICD-10-CM | POA: Diagnosis not present

## 2021-05-12 DIAGNOSIS — E785 Hyperlipidemia, unspecified: Secondary | ICD-10-CM | POA: Diagnosis not present

## 2021-05-12 DIAGNOSIS — Z79899 Other long term (current) drug therapy: Secondary | ICD-10-CM | POA: Diagnosis not present

## 2021-05-12 DIAGNOSIS — Z87891 Personal history of nicotine dependence: Secondary | ICD-10-CM | POA: Diagnosis not present

## 2021-05-12 DIAGNOSIS — I10 Essential (primary) hypertension: Secondary | ICD-10-CM | POA: Diagnosis not present

## 2021-05-12 DIAGNOSIS — E611 Iron deficiency: Secondary | ICD-10-CM

## 2021-05-12 DIAGNOSIS — D509 Iron deficiency anemia, unspecified: Secondary | ICD-10-CM | POA: Diagnosis not present

## 2021-05-12 DIAGNOSIS — Z23 Encounter for immunization: Secondary | ICD-10-CM | POA: Diagnosis not present

## 2021-05-12 DIAGNOSIS — K219 Gastro-esophageal reflux disease without esophagitis: Secondary | ICD-10-CM | POA: Diagnosis not present

## 2021-05-12 MED ORDER — SODIUM CHLORIDE 0.9 % IV SOLN
510.0000 mg | Freq: Once | INTRAVENOUS | Status: AC
  Administered 2021-05-12: 510 mg via INTRAVENOUS
  Filled 2021-05-12: qty 510

## 2021-05-12 MED ORDER — METHYLPREDNISOLONE SODIUM SUCC 125 MG IJ SOLR
125.0000 mg | Freq: Once | INTRAMUSCULAR | Status: AC
  Administered 2021-05-12: 125 mg via INTRAVENOUS
  Filled 2021-05-12: qty 2

## 2021-05-12 MED ORDER — FAMOTIDINE 20 MG IN NS 100 ML IVPB
20.0000 mg | Freq: Once | INTRAVENOUS | Status: AC
  Administered 2021-05-12: 20 mg via INTRAVENOUS
  Filled 2021-05-12: qty 100

## 2021-05-12 MED ORDER — LORATADINE 10 MG PO TABS
10.0000 mg | ORAL_TABLET | Freq: Once | ORAL | Status: AC
  Administered 2021-05-12: 10 mg via ORAL
  Filled 2021-05-12: qty 1

## 2021-05-12 MED ORDER — SODIUM CHLORIDE 0.9 % IV SOLN: Freq: Once | INTRAVENOUS | Status: AC

## 2021-05-12 NOTE — Patient Instructions (Signed)
[facility]  Discharge Instructions: °Thank you for choosing [facility] to provide your oncology and hematology care.  °If you have a lab appointment with the Cancer Center, please come in thru the Main Entrance and check in at the main information desk. ° °Wear comfortable clothing and clothing appropriate for easy access to any Portacath or PICC line.  ° °We strive to give you quality time with your provider. You may need to reschedule your appointment if you arrive late (15 or more minutes).  Arriving late affects you and other patients whose appointments are after yours.  Also, if you miss three or more appointments without notifying the office, you may be dismissed from the clinic at the provider’s discretion.    °  °For prescription refill requests, have your pharmacy contact our office and allow 72 hours for refills to be completed.   ° °Today you received the following chemotherapy and/or immunotherapy agents Feraheme    °  °To help prevent nausea and vomiting after your treatment, we encourage you to take your nausea medication as directed. ° °BELOW ARE SYMPTOMS THAT SHOULD BE REPORTED IMMEDIATELY: °*FEVER GREATER THAN 100.4 F (38 °C) OR HIGHER °*CHILLS OR SWEATING °*NAUSEA AND VOMITING THAT IS NOT CONTROLLED WITH YOUR NAUSEA MEDICATION °*UNUSUAL SHORTNESS OF BREATH °*UNUSUAL BRUISING OR BLEEDING °*URINARY PROBLEMS (pain or burning when urinating, or frequent urination) °*BOWEL PROBLEMS (unusual diarrhea, constipation, pain near the anus) °TENDERNESS IN MOUTH AND THROAT WITH OR WITHOUT PRESENCE OF ULCERS (sore throat, sores in mouth, or a toothache) °UNUSUAL RASH, SWELLING OR PAIN  °UNUSUAL VAGINAL DISCHARGE OR ITCHING  ° °Items with * indicate a potential emergency and should be followed up as soon as possible or go to the Emergency Department if any problems should occur. ° °Please show the CHEMOTHERAPY ALERT CARD or IMMUNOTHERAPY ALERT CARD at check-in to the Emergency  Department and triage nurse. ° °Should you have questions after your visit or need to cancel or reschedule your appointment, please contact  [facility] [phone]  and follow the prompts.  Office hours are 8:00 a.m. to 4:30 p.m. Monday - Friday. Please note that voicemails left after 4:00 p.m. may not be returned until the following business day.  We are closed weekends and major holidays. You have access to a nurse at all times for urgent questions. Please call the main number to the clinic [phone] and follow the prompts. ° °For any non-urgent questions, you may also contact your provider using MyChart. We now offer e-Visits for anyone 18 and older to request care online for non-urgent symptoms. For details visit [email or web address]. °  °Also download the MyChart app! Go to the app store, search "MyChart", open the app, select [facility], and log in with your MyChart username and password. ° °Due to Covid, a mask is required upon entering the hospital/clinic. If you do not have a mask, one will be given to you upon arrival. For doctor visits, patients may have 1 support person aged 18 or older with them. For treatment visits, patients cannot have anyone with them due to current Covid guidelines and our immunocompromised population.  °

## 2021-05-12 NOTE — Progress Notes (Signed)
Patient presents today for Feraheme infusion per providers order.  Vital signs WNL.  Patient has no new complaints.    Peripheral IV started and blood return noted pre and post infusion.  Feraheme infusion given today per MD orders.  Stable during infusion without adverse affects.  Vital signs stable.  No complaints at this time.  Discharge from clinic ambulatory in stable condition.  Alert and oriented X 3.  Follow up with [facility] as scheduled.

## 2021-05-16 ENCOUNTER — Other Ambulatory Visit: Payer: Self-pay | Admitting: Family

## 2021-05-16 DIAGNOSIS — F331 Major depressive disorder, recurrent, moderate: Secondary | ICD-10-CM

## 2021-05-16 DIAGNOSIS — G2581 Restless legs syndrome: Secondary | ICD-10-CM

## 2021-05-16 DIAGNOSIS — F411 Generalized anxiety disorder: Secondary | ICD-10-CM

## 2021-05-17 DIAGNOSIS — M5416 Radiculopathy, lumbar region: Secondary | ICD-10-CM | POA: Diagnosis not present

## 2021-05-17 DIAGNOSIS — M542 Cervicalgia: Secondary | ICD-10-CM | POA: Diagnosis not present

## 2021-05-19 ENCOUNTER — Ambulatory Visit: Payer: Medicare Other | Admitting: Family

## 2021-05-25 ENCOUNTER — Other Ambulatory Visit: Payer: Self-pay | Admitting: Family

## 2021-05-25 DIAGNOSIS — F411 Generalized anxiety disorder: Secondary | ICD-10-CM

## 2021-05-25 DIAGNOSIS — I10 Essential (primary) hypertension: Secondary | ICD-10-CM

## 2021-06-05 ENCOUNTER — Other Ambulatory Visit: Payer: Self-pay | Admitting: Family

## 2021-06-05 DIAGNOSIS — E785 Hyperlipidemia, unspecified: Secondary | ICD-10-CM

## 2021-06-23 ENCOUNTER — Other Ambulatory Visit: Payer: Self-pay | Admitting: Family

## 2021-06-23 DIAGNOSIS — F331 Major depressive disorder, recurrent, moderate: Secondary | ICD-10-CM

## 2021-06-23 DIAGNOSIS — F411 Generalized anxiety disorder: Secondary | ICD-10-CM

## 2021-06-27 ENCOUNTER — Inpatient Hospital Stay (HOSPITAL_COMMUNITY): Payer: Medicare Other | Attending: Hematology

## 2021-06-27 DIAGNOSIS — Z79899 Other long term (current) drug therapy: Secondary | ICD-10-CM | POA: Insufficient documentation

## 2021-06-27 DIAGNOSIS — Z87891 Personal history of nicotine dependence: Secondary | ICD-10-CM | POA: Diagnosis not present

## 2021-06-27 DIAGNOSIS — E611 Iron deficiency: Secondary | ICD-10-CM

## 2021-06-27 DIAGNOSIS — D75839 Thrombocytosis, unspecified: Secondary | ICD-10-CM | POA: Insufficient documentation

## 2021-06-27 DIAGNOSIS — D509 Iron deficiency anemia, unspecified: Secondary | ICD-10-CM | POA: Diagnosis not present

## 2021-06-27 LAB — CBC WITH DIFFERENTIAL/PLATELET
Abs Immature Granulocytes: 0.01 10*3/uL (ref 0.00–0.07)
Basophils Absolute: 0.1 10*3/uL (ref 0.0–0.1)
Basophils Relative: 1 %
Eosinophils Absolute: 0.1 10*3/uL (ref 0.0–0.5)
Eosinophils Relative: 3 %
HCT: 38.8 % (ref 36.0–46.0)
Hemoglobin: 13.3 g/dL (ref 12.0–15.0)
Immature Granulocytes: 0 %
Lymphocytes Relative: 41 %
Lymphs Abs: 1.9 10*3/uL (ref 0.7–4.0)
MCH: 31 pg (ref 26.0–34.0)
MCHC: 34.3 g/dL (ref 30.0–36.0)
MCV: 90.4 fL (ref 80.0–100.0)
Monocytes Absolute: 0.5 10*3/uL (ref 0.1–1.0)
Monocytes Relative: 10 %
Neutro Abs: 2.1 10*3/uL (ref 1.7–7.7)
Neutrophils Relative %: 45 %
Platelets: 366 10*3/uL (ref 150–400)
RBC: 4.29 MIL/uL (ref 3.87–5.11)
RDW: 13.4 % (ref 11.5–15.5)
WBC: 4.7 10*3/uL (ref 4.0–10.5)
nRBC: 0 % (ref 0.0–0.2)

## 2021-06-27 LAB — IRON AND TIBC
Iron: 125 ug/dL (ref 28–170)
Saturation Ratios: 29 % (ref 10.4–31.8)
TIBC: 432 ug/dL (ref 250–450)
UIBC: 307 ug/dL

## 2021-06-27 LAB — FOLATE: Folate: 18.1 ng/mL (ref >5.9)

## 2021-06-27 LAB — VITAMIN B12: Vitamin B-12: 293 pg/mL (ref 180–914)

## 2021-06-27 LAB — FERRITIN: Ferritin: 248 ng/mL (ref 11–307)

## 2021-06-29 DIAGNOSIS — M5416 Radiculopathy, lumbar region: Secondary | ICD-10-CM | POA: Diagnosis not present

## 2021-06-29 LAB — METHYLMALONIC ACID, SERUM: Methylmalonic Acid, Quantitative: 177 nmol/L (ref 0–378)

## 2021-07-03 NOTE — Progress Notes (Signed)
[address], [place] [address]   CLINIC:  Medical Oncology/Hematology  PCP:  Sharion Balloon, [address] [place] [address] [phone]   REASON FOR VISIT:  Follow-up for iron deficiency anemia and thrombocytosis  PRIOR THERAPY: Oral iron supplementation  CURRENT THERAPY: IV iron (Feraheme x2 on 05/04/2021 and 05/12/2021)  INTERVAL HISTORY:  Brittany Lynch 60 y.o. female returns for routine follow-up of her iron deficiency anemia and thrombocytosis.She was last seen by Tarri Abernethy, PA-C on 04/28/2021.  At today's visit, she reports feeling fair.  No recent hospitalizations, surgeries, or changes in baseline health status.  She reports that her energy is a bit improved after having IV iron treatments.  She reports that she is eating less ice, but does still have some ongoing restless leg symptoms, for which she is on other medication.  She denies any lightheadedness, dyspnea on exertion, or chest pain.  No signs of GI bleeding such as bright red blood per rectum or melena.  Regarding her thrombocytosis, she denies any vasomotor symptoms or erythromelalgia.  She has 50% energy and 100% appetite. She endorses that she is maintaining a stable weight.   REVIEW OF SYSTEMS:  Review of Systems  Constitutional:  Positive for fatigue. Negative for appetite change, chills, diaphoresis, fever and unexpected weight change.  HENT:   Negative for lump/mass and nosebleeds.   Eyes:  Negative for eye problems.  Respiratory:  Negative for cough, hemoptysis and shortness of breath.   Cardiovascular:  Negative for chest pain, leg swelling and palpitations.  Gastrointestinal:  Positive for constipation (Secondary to opioid medication). Negative for abdominal pain, blood in stool, diarrhea, nausea and vomiting.  Genitourinary:  Negative for hematuria.   Musculoskeletal:  Positive for arthralgias and back pain.  Skin: Negative.   Neurological:   Negative for dizziness, headaches and light-headedness.  Hematological:  Does not bruise/bleed easily.     PAST MEDICAL/SURGICAL HISTORY:  Past Medical History:  Diagnosis Date   Anemia    Anxiety    Arthritis    spine/neck   Depression    GERD (gastroesophageal reflux disease)    Headache    Hyperlipidemia    Hypertension    Hypothyroidism    Iron deficiency 04/28/2021   Thyroid disease    Past Surgical History:  Procedure Laterality Date   ABDOMINAL HYSTERECTOMY     ANTERIOR CERVICAL DECOMP/DISCECTOMY FUSION N/A 02/08/2021   Procedure: CERVICAL FOUR-FIVE ANTERIOR CERVICAL DECOMPRESSION/DISCECTOMY FUSION;  Surgeon: Eustace Moore, MD;  Location: [facility];  Service: Neurosurgery;  Laterality: N/A;  3C   BACK SURGERY     CATARACT EXTRACTION Bilateral    EYE SURGERY     LUMBAR LAMINECTOMY/DECOMPRESSION MICRODISCECTOMY Bilateral 03/06/2018   Procedure: Laminectomy and Foraminotomy - Lumbar Two-Three, Three-Four bilateral;  Surgeon: Eustace Moore, MD;  Location: [facility];  Service: Neurosurgery;  Laterality: Bilateral;  Laminectomy and Foraminotomy - Lumbar Two-Three, Three-Four bilateral   NECK SURGERY     Right hand and wrist surgery Right    Laceration    SPINE SURGERY       SOCIAL HISTORY:  Social History   Socioeconomic History   Marital status: Legally Separated    Spouse name: Not on file   Number of children: 3   Years of education: Not on file   Highest education level: 9th grade  Occupational History   Occupation: Retired  Tobacco Use   Smoking status: Former    Types: Cigarettes  Quit date: 07/16/2001    Years since quitting: 19.9   Smokeless tobacco: Never  Vaping Use   Vaping Use: Never used  Substance and Sexual Activity   Alcohol use: No    Comment: hx alcoholism. none since age 65   Drug use: Yes    Comment: once every 2 weeks [facility])   Sexual activity: Yes  Other Topics Concern   Not on file  Social History Narrative   Lives in apartment basement  of daughters home    Social Determinants of Health   Financial Resource Strain: Low Risk    Difficulty of Paying Living Expenses: Not very hard  Food Insecurity: No Food Insecurity   Worried About Charity fundraiser in the Last Year: Never true   Ran Out of Food in the Last Year: Never true  Transportation Needs: No Transportation Needs   Lack of Transportation (Medical): No   Lack of Transportation (Non-Medical): No  Physical Activity: Inactive   Days of Exercise per Week: 0 days   Minutes of Exercise per Session: 0 min  Stress: No Stress Concern Present   Feeling of Stress : Not at all  Social Connections: Moderately Isolated   Frequency of Communication with Friends and Family: More than three times a week   Frequency of Social Gatherings with Friends and Family: More than three times a week   Attends Religious Services: More than 4 times per year   Active Member of [employer] or Organizations: No   Attends Archivist Meetings: Never   Marital Status: Separated  Intimate Partner Violence: Not At Risk   Fear of Current or Ex-Partner: No   Emotionally Abused: No   Physically Abused: No   Sexually Abused: No    FAMILY HISTORY:  Family History  Problem Relation Age of Onset   Mental illness Mother    Alcohol abuse Father    Diabetes Father    Heart disease Father     CURRENT MEDICATIONS:  Outpatient Encounter Medications as of 07/04/2021  Medication Sig   atorvastatin (LIPITOR) 20 MG tablet Take 1 tablet (20 mg total) by mouth daily.   buPROPion (WELLBUTRIN SR) 150 MG 12 hr tablet Take 1 tablet (150 mg total) by mouth daily.   busPIRone (BUSPAR) 10 MG tablet TAKE ONE TABLET BY MOUTH THREE TIMES DAILY   diclofenac (VOLTAREN) 50 MG EC tablet Take 50 mg by mouth 3 (three) times daily.   diphenhydrAMINE (BENADRYL) 25 mg capsule Take 25 mg by mouth every 6 (six) hours as needed for itching (after neck injections).   estradiol (ESTRACE) 2 MG tablet Take 1 tablet (2 mg  total) by mouth daily.   fenofibrate (TRICOR) 145 MG tablet Take 1 tablet (145 mg total) by mouth daily.   ferrous sulfate 325 (65 FE) MG tablet Take 325 mg by mouth at bedtime.   levothyroxine (SYNTHROID) 100 MCG tablet Take 1 tablet (100 mcg total) by mouth daily.   lubiprostone (AMITIZA) 24 MCG capsule TAKE ONE CAPSULE BY MOUTH ONCE DAILY FOR CONSTIPATION   methocarbamol (ROBAXIN) 750 MG tablet Take 750 mg by mouth every 6 (six) hours as needed for muscle spasms.   metoprolol tartrate (LOPRESSOR) 50 MG tablet TAKE ONE TABLET BY MOUTH TWICE DAILY   Multiple Vitamin (MULTIVITAMIN WITH MINERALS) TABS tablet Take 1 tablet by mouth daily. Women's 50+   omeprazole (PRILOSEC) 40 MG capsule TAKE ONE CAPSULE BY MOUTH EVERY DAY   Oxycodone HCl 10 MG TABS Take 10 mg by  mouth every 6 (six) hours as needed for severe pain.   pramipexole (MIRAPEX) 0.5 MG tablet TAKE TWO TABLETS BY MOUTH EVERY EVENING   venlafaxine XR (EFFEXOR-XR) 150 MG 24 hr capsule TAKE 1 CAPSULE BY MOUTH ONCE DAILY WITH BREAKFAST   No facility-administered encounter medications on file as of 07/04/2021.    ALLERGIES:  Allergies  Allergen Reactions   Chocolate Shortness Of Breath and Rash   Iodinated Diagnostic Agents Anaphylaxis, Shortness Of Breath and Rash    Can tolerate with Benadryl injection    Methotrexate Derivatives Shortness Of Breath and Rash   Lisinopril Cough   Latex    Tape Rash and Other (See Comments)    Paper tape only.     PHYSICAL EXAM:  ECOG PERFORMANCE STATUS: 1 - Symptomatic but completely ambulatory  There were no vitals filed for this visit. There were no vitals filed for this visit. Physical Exam Constitutional:      Appearance: Normal appearance.  HENT:     Head: Normocephalic and atraumatic.     Mouth/Throat:     Mouth: Mucous membranes are moist.  Eyes:     Extraocular Movements: Extraocular movements intact.     Pupils: Pupils are equal, round, and reactive to light.  Cardiovascular:      Rate and Rhythm: Normal rate and regular rhythm.     Pulses: Normal pulses.     Heart sounds: Normal heart sounds.  Pulmonary:     Effort: Pulmonary effort is normal.     Breath sounds: Normal breath sounds.  Abdominal:     General: Bowel sounds are normal.     Palpations: Abdomen is soft.     Tenderness: There is no abdominal tenderness.  Musculoskeletal:        General: No swelling.     Right lower leg: No edema.     Left lower leg: No edema.  Lymphadenopathy:     Cervical: No cervical adenopathy.  Skin:    General: Skin is warm and dry.  Neurological:     General: No focal deficit present.     Mental Status: She is alert and oriented to person, place, and time.  Psychiatric:        Mood and Affect: Mood normal.        Behavior: Behavior normal.     LABORATORY DATA:  I have reviewed the labs as listed.  CBC    Component Value Date/Time   WBC 4.7 06/27/2021 1010   RBC 4.29 06/27/2021 1010   HGB 13.3 06/27/2021 1010   HGB 12.1 11/17/2020 1046   HCT 38.8 06/27/2021 1010   HCT 35.3 11/17/2020 1046   PLT 366 06/27/2021 1010   PLT 536 (H) 11/17/2020 1046   MCV 90.4 06/27/2021 1010   MCV 85 11/17/2020 1046   MCH 31.0 06/27/2021 1010   MCHC 34.3 06/27/2021 1010   RDW 13.4 06/27/2021 1010   RDW 12.7 11/17/2020 1046   LYMPHSABS 1.9 06/27/2021 1010   LYMPHSABS 2.6 11/17/2020 1046   MONOABS 0.5 06/27/2021 1010   EOSABS 0.1 06/27/2021 1010   EOSABS 0.2 11/17/2020 1046   BASOSABS 0.1 06/27/2021 1010   BASOSABS 0.1 11/17/2020 1046   CMP Latest Ref Rng & Units 04/21/2021 02/06/2021 01/02/2021  Glucose 70 - 99 mg/dL 71 82 110(H)  BUN 6 - 20 mg/dL 13 14 17   Creatinine 0.44 - 1.00 mg/dL 0.78 0.90 0.69  Sodium 135 - 145 mmol/L 140 138 136  Potassium 3.5 - 5.1 mmol/L 4.0 4.0  4.6  Chloride 98 - 111 mmol/L 104 101 99  CO2 22 - 32 mmol/L 29 28 29   Calcium 8.9 - 10.3 mg/dL 9.0 9.4 9.2  Total Protein 6.5 - 8.1 g/dL 7.6 - 7.4  Total Bilirubin 0.3 - 1.2 mg/dL 0.7 - 0.2(L)   Alkaline Phos 38 - 126 U/L 45 - 35(L)  AST 15 - 41 U/L 20 - 16  ALT 0 - 44 U/L 18 - 16    DIAGNOSTIC IMAGING:  I have independently reviewed the relevant imaging and discussed with the patient.  ASSESSMENT & PLAN: 1.  Thrombocytosis - Referred to hematology for initial consult with Dr. Chryl Heck on 12/09/2020. - No previous history of blood clots, no current signs or symptoms of DVT or PE - Review of past labs shows intermittent thrombocytosis since January 2020, with maximum platelets 536 - MPN work-up negative for JAK2, CALR, or MPL mutation.  (BCR/ABL not tested) - Other work-up revealed iron deficiency with ferritin 12 and iron saturation 9% - IV iron repletion with Feraheme in October 2022 - Platelets returned to normal after iron repletion, with platelets 366 (06/27/2021) - Differential diagnosis favors reactive thrombocytosis secondary to iron deficiency. - PLAN: No indication for further testing/MPN work-up at this time (i.e. BCR/ABL testing). - We will check CBC/iron panel again in 6 months.   2.  Iron deficiency state without anemia - Initial work-up (12/09/2020): Iron deficiency with ferritin 12, iron saturation 9% - Patient was started on oral iron supplementation (ferrous sulfate) at her last visit (01/06/2021), which caused some constipation - Repeat iron panel (04/25/2021): Persistent iron deficiency with ferritin 22, iron saturation 13%, elevated TIBC 572 - Patient received Feraheme x2 on 05/04/2021 and 05/12/2021 (steroid premedication given due to multiple medication allergies) - Patient denies any signs or symptoms of blood loss such as epistaxis, hematemesis, hematochezia, or melena.  - Repeat labs after IV iron (06/27/2021): Hgb 13.3, ferritin 248, iron saturation 29%.  Folate, B12, methylmalonic acid within normal limits. - Suspect malabsorption, possibly due to daily PPI use  - PLAN: Repeat CBC/iron panel and RTC in 6 months.   PLAN SUMMARY & DISPOSITION: Labs and  RTC in 6 months  All questions were answered. The patient knows to call the clinic with any problems, questions or concerns.  Medical decision making: Low  Time spent on visit: I spent 15 minutes counseling the patient face to face. The total time spent in the appointment was 25 minutes and more than 50% was on counseling.   Brittany Rush, PA-C  07/04/2021 9:50 AM

## 2021-07-04 ENCOUNTER — Encounter (HOSPITAL_COMMUNITY): Payer: Self-pay | Admitting: Physician Assistant

## 2021-07-04 ENCOUNTER — Inpatient Hospital Stay (HOSPITAL_BASED_OUTPATIENT_CLINIC_OR_DEPARTMENT_OTHER): Payer: Medicare Other | Admitting: Physician Assistant

## 2021-07-04 ENCOUNTER — Other Ambulatory Visit: Payer: Self-pay

## 2021-07-04 VITALS — BP 127/90 | HR 85 | Temp 96.9°F | Resp 17 | Ht 62.0 in | Wt 153.4 lb

## 2021-07-04 DIAGNOSIS — Z87891 Personal history of nicotine dependence: Secondary | ICD-10-CM | POA: Diagnosis not present

## 2021-07-04 DIAGNOSIS — E611 Iron deficiency: Secondary | ICD-10-CM

## 2021-07-04 DIAGNOSIS — Z79899 Other long term (current) drug therapy: Secondary | ICD-10-CM | POA: Diagnosis not present

## 2021-07-04 DIAGNOSIS — D75839 Thrombocytosis, unspecified: Secondary | ICD-10-CM

## 2021-07-04 DIAGNOSIS — D509 Iron deficiency anemia, unspecified: Secondary | ICD-10-CM | POA: Diagnosis not present

## 2021-07-04 NOTE — Patient Instructions (Signed)
[place] at [facility] Discharge Instructions  You were seen today by Tarri Abernethy PA-C for your elevated platelets and iron deficiency.  Your platelet levels are back to normal now that your iron has improved.  You do not need any further treatment at this time, but we will check your labs again in about 6 months.  LABS: Return in 6 months for repeat labs  OTHER TESTS: No other tests at this time  MEDICATIONS: No changes to home medications  FOLLOW-UP APPOINTMENT: Office visit in 6 months, after labs   Thank you for choosing [place] at [facility] to provide your oncology and hematology care.  To afford each patient quality time with our provider, please arrive at least 15 minutes before your scheduled appointment time.   If you have a lab appointment with the [place] please come in thru the Main Entrance and check in at the main information desk.  You need to re-schedule your appointment should you arrive 10 or more minutes late.  We strive to give you quality time with our providers, and arriving late affects you and other patients whose appointments are after yours.  Also, if you no show three or more times for appointments you may be dismissed from the clinic at the providers discretion.     Again, thank you for choosing [facility].  Our hope is that these requests will decrease the amount of time that you wait before being seen by our physicians.       _____________________________________________________________  Should you have questions after your visit to [facility], please contact our office at [phone] and follow the prompts.  Our office hours are 8:00 a.m. and 4:30 p.m. Monday - Friday.  Please note that voicemails left after 4:00 p.m. may not be returned until the following business day.  We are closed weekends and major holidays.  You do have access to a nurse 24-7, just call the  main number to the clinic [phone] and do not press any options, hold on the line and a nurse will answer the phone.    For prescription refill requests, have your pharmacy contact our office and allow 72 hours.    Due to Covid, you will need to wear a mask upon entering the hospital. If you do not have a mask, a mask will be given to you at the Main Entrance upon arrival. For doctor visits, patients may have 1 support person age 61 or older with them. For treatment visits, patients can not have anyone with them due to social distancing guidelines and our immunocompromised population.

## 2021-07-17 ENCOUNTER — Encounter (HOSPITAL_COMMUNITY): Payer: Self-pay | Admitting: Hematology

## 2021-07-18 ENCOUNTER — Telehealth: Payer: Commercial Managed Care - HMO | Admitting: Physician Assistant

## 2021-07-18 ENCOUNTER — Encounter (HOSPITAL_COMMUNITY): Payer: Self-pay | Admitting: Hematology

## 2021-07-18 DIAGNOSIS — B9689 Other specified bacterial agents as the cause of diseases classified elsewhere: Secondary | ICD-10-CM | POA: Diagnosis not present

## 2021-07-18 DIAGNOSIS — J019 Acute sinusitis, unspecified: Secondary | ICD-10-CM | POA: Diagnosis not present

## 2021-07-18 DIAGNOSIS — B379 Candidiasis, unspecified: Secondary | ICD-10-CM | POA: Diagnosis not present

## 2021-07-18 DIAGNOSIS — T3695XA Adverse effect of unspecified systemic antibiotic, initial encounter: Secondary | ICD-10-CM

## 2021-07-18 MED ORDER — FLUCONAZOLE 150 MG PO TABS: 150.0000 mg | ORAL_TABLET | Freq: Once | ORAL | 0 refills | Status: AC

## 2021-07-18 MED ORDER — AMOXICILLIN-POT CLAVULANATE 875-125 MG PO TABS: 1.0000 | ORAL_TABLET | Freq: Two times a day (BID) | ORAL | 0 refills | Status: DC

## 2021-07-18 NOTE — Patient Instructions (Signed)
Brittany Lynch, thank you for joining Mar Daring, PA-C for today's virtual visit.  While this provider is not your primary care provider (PCP), if your PCP is located in our provider database this encounter information will be shared with them immediately following your visit.  Consent: (Patient) Brittany Lynch provided verbal consent for this virtual visit at the beginning of the encounter.  Current Medications:  Current Outpatient Medications:    amoxicillin-clavulanate (AUGMENTIN) 875-125 MG tablet, Take 1 tablet by mouth 2 (two) times daily., Disp: 14 tablet, Rfl: 0   fluconazole (DIFLUCAN) 150 MG tablet, Take 1 tablet (150 mg total) by mouth once for 1 dose., Disp: 1 tablet, Rfl: 0   atorvastatin (LIPITOR) 20 MG tablet, Take 1 tablet (20 mg total) by mouth daily., Disp: 90 tablet, Rfl: 0   buPROPion (WELLBUTRIN SR) 150 MG 12 hr tablet, Take 1 tablet (150 mg total) by mouth daily., Disp: 90 tablet, Rfl: 0   busPIRone (BUSPAR) 10 MG tablet, TAKE ONE TABLET BY MOUTH THREE TIMES DAILY, Disp: 90 tablet, Rfl: 0   diazepam (VALIUM) 5 MG tablet, SMARTSIG:1 Tablet(s) By Mouth, Disp: , Rfl:    diclofenac (VOLTAREN) 50 MG EC tablet, Take 50 mg by mouth 3 (three) times daily., Disp: , Rfl:    diphenhydrAMINE (BENADRYL) 25 mg capsule, Take 25 mg by mouth every 6 (six) hours as needed for itching (after neck injections)., Disp: , Rfl:    estradiol (ESTRACE) 2 MG tablet, Take 1 tablet (2 mg total) by mouth daily., Disp: 90 tablet, Rfl: 0   fenofibrate (TRICOR) 145 MG tablet, Take 1 tablet (145 mg total) by mouth daily., Disp: 90 tablet, Rfl: 0   ferrous sulfate 325 (65 FE) MG tablet, Take 325 mg by mouth at bedtime., Disp: , Rfl:    levothyroxine (SYNTHROID) 100 MCG tablet, Take 1 tablet (100 mcg total) by mouth daily., Disp: 90 tablet, Rfl: 2   lubiprostone (AMITIZA) 24 MCG capsule, TAKE ONE CAPSULE BY MOUTH ONCE DAILY FOR CONSTIPATION, Disp: 90 capsule, Rfl: 0   methocarbamol (ROBAXIN) 750  MG tablet, Take 750 mg by mouth every 6 (six) hours as needed for muscle spasms., Disp: , Rfl:    metoprolol tartrate (LOPRESSOR) 50 MG tablet, TAKE ONE TABLET BY MOUTH TWICE DAILY, Disp: 60 tablet, Rfl: 0   Multiple Vitamin (MULTIVITAMIN WITH MINERALS) TABS tablet, Take 1 tablet by mouth daily. Women's 50+, Disp: , Rfl:    omeprazole (PRILOSEC) 40 MG capsule, TAKE ONE CAPSULE BY MOUTH EVERY DAY, Disp: 90 capsule, Rfl: 0   Oxycodone HCl 10 MG TABS, Take 10 mg by mouth every 6 (six) hours as needed for severe pain., Disp: , Rfl:    pramipexole (MIRAPEX) 0.5 MG tablet, TAKE TWO TABLETS BY MOUTH EVERY EVENING, Disp: 180 tablet, Rfl: 0   venlafaxine XR (EFFEXOR-XR) 150 MG 24 hr capsule, TAKE 1 CAPSULE BY MOUTH ONCE DAILY WITH BREAKFAST, Disp: 30 capsule, Rfl: 5   Medications ordered in this encounter:  Meds ordered this encounter  Medications   amoxicillin-clavulanate (AUGMENTIN) 875-125 MG tablet    Sig: Take 1 tablet by mouth 2 (two) times daily.    Dispense:  14 tablet    Refill:  0    Order Specific Question:   Supervising Provider    Answer:   MILLER, BRIAN [[id number]]   fluconazole (DIFLUCAN) 150 MG tablet    Sig: Take 1 tablet (150 mg total) by mouth once for 1 dose.    Dispense:  1  tablet    Refill:  0    Order Specific Question:   Supervising Provider    Answer:   Noemi Chapel [[id number]]     *If you need refills on other medications prior to your next appointment, please contact your pharmacy*  Follow-Up: Call back or seek an in-person evaluation if the symptoms worsen or if the condition fails to improve as anticipated.  Other Instructions Sinusitis, Adult Sinusitis is soreness and swelling (inflammation) of your sinuses. Sinuses are hollow spaces in the bones around your face. They are located: Around your eyes. In the middle of your forehead. Behind your nose. In your cheekbones. Your sinuses and nasal passages are lined with a fluid called mucus. Mucus drains out of your  sinuses. Swelling can trap mucus in your sinuses. This lets germs (bacteria, virus, or fungus) grow, which leads to infection. Most of the time, this condition is caused by a virus. What are the causes? This condition is caused by: Allergies. Asthma. Germs. Things that block your nose or sinuses. Growths in the nose (nasal polyps). Chemicals or irritants in the air. Fungus (rare). What increases the risk? You are more likely to develop this condition if: You have a weak body defense system (immune system). You do a lot of swimming or diving. You use nasal sprays too much. You smoke. What are the signs or symptoms? The main symptoms of this condition are pain and a feeling of pressure around the sinuses. Other symptoms include: Stuffy nose (congestion). Runny nose (drainage). Swelling and warmth in the sinuses. Headache. Toothache. A cough that may get worse at night. Mucus that collects in the throat or the back of the nose (postnasal drip). Being unable to smell and taste. Being very tired (fatigue). A fever. Sore throat. Bad breath. How is this diagnosed? This condition is diagnosed based on: Your symptoms. Your medical history. A physical exam. Tests to find out if your condition is short-term (acute) or long-term (chronic). Your doctor may: Check your nose for growths (polyps). Check your sinuses using a tool that has a light (endoscope). Check for allergies or germs. Do imaging tests, such as an MRI or CT scan. How is this treated? Treatment for this condition depends on the cause and whether it is short-term or long-term. If caused by a virus, your symptoms should go away on their own within 10 days. You may be given medicines to relieve symptoms. They include: Medicines that shrink swollen tissue in the nose. Medicines that treat allergies (antihistamines). A spray that treats swelling of the nostrils.  Rinses that help get rid of thick mucus in your nose (nasal  saline washes). If caused by bacteria, your doctor may wait to see if you will get better without treatment. You may be given antibiotic medicine if you have: A very bad infection. A weak body defense system. If caused by growths in the nose, you may need to have surgery. Follow these instructions at home: Medicines Take, use, or apply over-the-counter and prescription medicines only as told by your doctor. These may include nasal sprays. If you were prescribed an antibiotic medicine, take it as told by your doctor. Do not stop taking the antibiotic even if you start to feel better. Hydrate and humidify  Drink enough water to keep your pee (urine) pale yellow. Use a cool mist humidifier to keep the humidity level in your home above 50%. Breathe in steam for 10-15 minutes, 3-4 times a day, or as told by your doctor.  You can do this in the bathroom while a hot shower is running. Try not to spend time in cool or dry air. Rest Rest as much as you can. Sleep with your head raised (elevated). Make sure you get enough sleep each night. General instructions  Put a warm, moist washcloth on your face 3-4 times a day, or as often as told by your doctor. This will help with discomfort. Wash your hands often with soap and water. If there is no soap and water, use hand sanitizer. Do not smoke. Avoid being around people who are smoking (secondhand smoke). Keep all follow-up visits as told by your doctor. This is important. Contact a doctor if: You have a fever. Your symptoms get worse. Your symptoms do not get better within 10 days. Get help right away if: You have a very bad headache. You cannot stop throwing up (vomiting). You have very bad pain or swelling around your face or eyes. You have trouble seeing. You feel confused. Your neck is stiff. You have trouble breathing. Summary Sinusitis is swelling of your sinuses. Sinuses are hollow spaces in the bones around your face. This condition  is caused by tissues in your nose that become inflamed or swollen. This traps germs. These can lead to infection. If you were prescribed an antibiotic medicine, take it as told by your doctor. Do not stop taking it even if you start to feel better. Keep all follow-up visits as told by your doctor. This is important. This information is not intended to replace advice given to you by your health care provider. Make sure you discuss any questions you have with your health care provider. Document Revised: 12/02/2017 Document Reviewed: 12/02/2017 Elsevier Patient Education  2022 [employer].    If you have been instructed to have an in-person evaluation today at a local Urgent Care facility, please use the link below. It will take you to a list of all of our available [facility], including address, phone number and hours of operation. Please do not delay care.  [facility]  If you or a family member do not have a primary care provider, use the link below to schedule a visit and establish care. When you choose a [facility] [facility] primary care physician or advanced practice provider, you gain a long-term partner in health. Find a Primary Care Provider  Learn more about [facility]'s in-office and virtual care options: [facility]

## 2021-07-18 NOTE — Progress Notes (Signed)
Virtual Visit Consent   Brittany Lynch, you are scheduled for a virtual visit with a [facility] provider today.     Just as with appointments in the office, your consent must be obtained to participate.  Your consent will be active for this visit and any virtual visit you may have with one of our providers in the next 365 days.     If you have a MyChart account, a copy of this consent can be sent to you electronically.  All virtual visits are billed to your insurance company just like a traditional visit in the office.    As this is a virtual visit, video technology does not allow for your provider to perform a traditional examination.  This may limit your provider's ability to fully assess your condition.  If your provider identifies any concerns that need to be evaluated in person or the need to arrange testing (such as labs, EKG, etc.), we will make arrangements to do so.     Although advances in technology are sophisticated, we cannot ensure that it will always work on either your end or our end.  If the connection with a video visit is poor, the visit may have to be switched to a telephone visit.  With either a video or telephone visit, we are not always able to ensure that we have a secure connection.     I need to obtain your verbal consent now.   Are you willing to proceed with your visit today?    Brittany Lynch has provided verbal consent on 07/18/2021 for a virtual visit (video or telephone).   Mar Daring, PA-C   Date: 07/18/2021 4:44 PM   Virtual Visit via Video Note   I, Mar Daring, connected with  Brittany Lynch  ([id number], 12/12/1960) on 07/18/21 at  4:45 PM EST by a video-enabled telemedicine application and verified that I am speaking with the correct person using two identifiers.  Location: Patient: Virtual Visit Location Patient: Home Provider: Virtual Visit Location Provider: Home Office   I discussed the limitations of evaluation and management  by telemedicine and the availability of in person appointments. The patient expressed understanding and agreed to proceed.    Interactive audio and video communications were attempted, although failed due to patient's inability to connect to video. Continued visit with audio only interaction with patient agreement.   History of Present Illness: Brittany Lynch is a 61 y.o. who identifies as a female who was assigned female at birth, and is being seen today for possible sinus infection.  HPI: Sinusitis This is a new problem. The current episode started 1 to 4 weeks ago. The problem is unchanged. There has been no fever. She is experiencing no pain. Associated symptoms include congestion, coughing, sinus pressure and a sore throat (went away in a day or two). Pertinent negatives include no headaches, hoarse voice, shortness of breath or swollen glands. (Has foul odor in nose with discharge) Past treatments include acetaminophen (claritin). The treatment provided no relief.     Problems:  Patient Active Problem List   Diagnosis Date Noted   Iron deficiency 04/28/2021   S/P cervical spinal fusion 02/08/2021   Thrombocytosis 12/09/2020   Constipation 07/26/2020   RLS (restless legs syndrome) 07/26/2020   GERD (gastroesophageal reflux disease) 08/14/2018   S/P lumbar laminectomy 03/06/2018   Vitamin D deficiency 10/18/2017   Iron deficiency anemia 10/18/2017   Hypertriglyceridemia 10/18/2017   History of colonic polyps 12/14/2015  GAD (generalized anxiety disorder) 09/20/2014   Hypothyroidism 09/20/2014   Chronic lower back pain 09/20/2014   Depression 01/25/2014   Hypertension 09/18/2013   Hyperlipidemia 09/18/2013    Allergies:  Allergies  Allergen Reactions   Chocolate Shortness Of Breath and Rash   Iodinated Contrast Media Anaphylaxis, Shortness Of Breath and Rash    Can tolerate with Benadryl injection    Methotrexate Derivatives Shortness Of Breath and Rash   Lisinopril  Cough   Latex    Tape Rash and Other (See Comments)    Paper tape only.   Medications:  Current Outpatient Medications:    amoxicillin-clavulanate (AUGMENTIN) 875-125 MG tablet, Take 1 tablet by mouth 2 (two) times daily., Disp: 14 tablet, Rfl: 0   fluconazole (DIFLUCAN) 150 MG tablet, Take 1 tablet (150 mg total) by mouth once for 1 dose., Disp: 1 tablet, Rfl: 0   atorvastatin (LIPITOR) 20 MG tablet, Take 1 tablet (20 mg total) by mouth daily., Disp: 90 tablet, Rfl: 0   buPROPion (WELLBUTRIN SR) 150 MG 12 hr tablet, Take 1 tablet (150 mg total) by mouth daily., Disp: 90 tablet, Rfl: 0   busPIRone (BUSPAR) 10 MG tablet, TAKE ONE TABLET BY MOUTH THREE TIMES DAILY, Disp: 90 tablet, Rfl: 0   diazepam (VALIUM) 5 MG tablet, SMARTSIG:1 Tablet(s) By Mouth, Disp: , Rfl:    diclofenac (VOLTAREN) 50 MG EC tablet, Take 50 mg by mouth 3 (three) times daily., Disp: , Rfl:    diphenhydrAMINE (BENADRYL) 25 mg capsule, Take 25 mg by mouth every 6 (six) hours as needed for itching (after neck injections)., Disp: , Rfl:    estradiol (ESTRACE) 2 MG tablet, Take 1 tablet (2 mg total) by mouth daily., Disp: 90 tablet, Rfl: 0   fenofibrate (TRICOR) 145 MG tablet, Take 1 tablet (145 mg total) by mouth daily., Disp: 90 tablet, Rfl: 0   ferrous sulfate 325 (65 FE) MG tablet, Take 325 mg by mouth at bedtime., Disp: , Rfl:    levothyroxine (SYNTHROID) 100 MCG tablet, Take 1 tablet (100 mcg total) by mouth daily., Disp: 90 tablet, Rfl: 2   lubiprostone (AMITIZA) 24 MCG capsule, TAKE ONE CAPSULE BY MOUTH ONCE DAILY FOR CONSTIPATION, Disp: 90 capsule, Rfl: 0   methocarbamol (ROBAXIN) 750 MG tablet, Take 750 mg by mouth every 6 (six) hours as needed for muscle spasms., Disp: , Rfl:    metoprolol tartrate (LOPRESSOR) 50 MG tablet, TAKE ONE TABLET BY MOUTH TWICE DAILY, Disp: 60 tablet, Rfl: 0   Multiple Vitamin (MULTIVITAMIN WITH MINERALS) TABS tablet, Take 1 tablet by mouth daily. Women's 50+, Disp: , Rfl:    omeprazole  (PRILOSEC) 40 MG capsule, TAKE ONE CAPSULE BY MOUTH EVERY DAY, Disp: 90 capsule, Rfl: 0   Oxycodone HCl 10 MG TABS, Take 10 mg by mouth every 6 (six) hours as needed for severe pain., Disp: , Rfl:    pramipexole (MIRAPEX) 0.5 MG tablet, TAKE TWO TABLETS BY MOUTH EVERY EVENING, Disp: 180 tablet, Rfl: 0   venlafaxine XR (EFFEXOR-XR) 150 MG 24 hr capsule, TAKE 1 CAPSULE BY MOUTH ONCE DAILY WITH BREAKFAST, Disp: 30 capsule, Rfl: 5  Observations/Objective: Patient is well-developed, well-nourished in no acute distress.  Resting comfortably at home.  Head is normocephalic, atraumatic.  No labored breathing.  Speech is clear and coherent with logical content.  Patient is alert and oriented at baseline.    Assessment and Plan: 1. Acute bacterial sinusitis - amoxicillin-clavulanate (AUGMENTIN) 875-125 MG tablet; Take 1 tablet by mouth 2 (two)  times daily.  Dispense: 14 tablet; Refill: 0  2. Antibiotic-induced yeast infection - fluconazole (DIFLUCAN) 150 MG tablet; Take 1 tablet (150 mg total) by mouth once for 1 dose.  Dispense: 1 tablet; Refill: 0  - Worsening symptoms that have not responded to OTC medications.  - Will give augmentin  - Gets yeast infections with antibiotics. Diflucan given - Continue allergy medications.  - Stay well hydrated and get plenty of rest.  - Seek in person evaluation if no symptom improvement or if symptoms worsen.  Follow Up Instructions: I discussed the assessment and treatment plan with the patient. The patient was provided an opportunity to ask questions and all were answered. The patient agreed with the plan and demonstrated an understanding of the instructions.  A copy of instructions were sent to the patient via MyChart unless otherwise noted below.   Patient has requested to receive PHI (AVS, Work Notes, etc) pertaining to this video visit through e-mail as they are currently without active [place]. They have voiced understand that email is not considered  secure and their health information could be viewed by someone other than the patient.   The patient was advised to call back or seek an in-person evaluation if the symptoms worsen or if the condition fails to improve as anticipated.  Time:  I spent 13 minutes with the patient via telehealth technology discussing the above problems/concerns.    Mar Daring, PA-C

## 2021-07-22 ENCOUNTER — Other Ambulatory Visit: Payer: Self-pay | Admitting: Family

## 2021-07-22 DIAGNOSIS — I10 Essential (primary) hypertension: Secondary | ICD-10-CM

## 2021-07-22 DIAGNOSIS — F411 Generalized anxiety disorder: Secondary | ICD-10-CM

## 2021-07-24 ENCOUNTER — Ambulatory Visit (INDEPENDENT_AMBULATORY_CARE_PROVIDER_SITE_OTHER): Payer: Commercial Managed Care - HMO | Admitting: Family

## 2021-07-24 ENCOUNTER — Encounter: Payer: Self-pay | Admitting: Family

## 2021-07-24 VITALS — BP 131/82 | HR 69 | Temp 97.8°F | Ht 62.0 in | Wt 156.0 lb

## 2021-07-24 DIAGNOSIS — D75839 Thrombocytosis, unspecified: Secondary | ICD-10-CM

## 2021-07-24 DIAGNOSIS — Z981 Arthrodesis status: Secondary | ICD-10-CM

## 2021-07-24 DIAGNOSIS — E039 Hypothyroidism, unspecified: Secondary | ICD-10-CM | POA: Diagnosis not present

## 2021-07-24 DIAGNOSIS — Z1211 Encounter for screening for malignant neoplasm of colon: Secondary | ICD-10-CM

## 2021-07-24 DIAGNOSIS — D509 Iron deficiency anemia, unspecified: Secondary | ICD-10-CM

## 2021-07-24 DIAGNOSIS — K219 Gastro-esophageal reflux disease without esophagitis: Secondary | ICD-10-CM

## 2021-07-24 DIAGNOSIS — E559 Vitamin D deficiency, unspecified: Secondary | ICD-10-CM

## 2021-07-24 DIAGNOSIS — Z23 Encounter for immunization: Secondary | ICD-10-CM

## 2021-07-24 DIAGNOSIS — M545 Low back pain, unspecified: Secondary | ICD-10-CM | POA: Diagnosis not present

## 2021-07-24 DIAGNOSIS — E785 Hyperlipidemia, unspecified: Secondary | ICD-10-CM | POA: Diagnosis not present

## 2021-07-24 DIAGNOSIS — G8929 Other chronic pain: Secondary | ICD-10-CM

## 2021-07-24 DIAGNOSIS — I10 Essential (primary) hypertension: Secondary | ICD-10-CM | POA: Diagnosis not present

## 2021-07-24 DIAGNOSIS — Z9889 Other specified postprocedural states: Secondary | ICD-10-CM | POA: Diagnosis not present

## 2021-07-24 DIAGNOSIS — F411 Generalized anxiety disorder: Secondary | ICD-10-CM

## 2021-07-24 DIAGNOSIS — F331 Major depressive disorder, recurrent, moderate: Secondary | ICD-10-CM

## 2021-07-24 DIAGNOSIS — K5900 Constipation, unspecified: Secondary | ICD-10-CM

## 2021-07-24 MED ORDER — DESVENLAFAXINE SUCCINATE ER 100 MG PO TB24: 100.0000 mg | ORAL_TABLET | Freq: Every day | ORAL | 1 refills | Status: DC

## 2021-07-24 MED ORDER — BUPROPION HCL ER (XL) 300 MG PO TB24: 300.0000 mg | ORAL_TABLET | Freq: Every day | ORAL | 1 refills | Status: DC

## 2021-07-24 MED ORDER — LINACLOTIDE 145 MCG PO CAPS: 145.0000 ug | ORAL_CAPSULE | Freq: Every day | ORAL | 1 refills | Status: DC

## 2021-07-24 NOTE — Progress Notes (Signed)
Subjective:    Patient ID: Brittany Lynch, female    DOB: August 06, 1960, 61 y.o.   MRN: [id number]  No chief complaint on file.  Pt presents to the office today for chronic follow up. She is followed by Neurosurgeon and pain management every month and has hx of lumbar laminectomy and hx of cervical spinal fusion. She is followed by Hematologists every 6 months for iron deficiency and thrombocytosis.   She is complaining of increase fatigue. She feels overwhelmed with doing tasks.  Hypertension This is a chronic problem. The current episode started more than 1 year ago. The problem has been resolved since onset. The problem is controlled. Associated symptoms include anxiety and malaise/fatigue. Pertinent negatives include no peripheral edema or shortness of breath. Risk factors for coronary artery disease include dyslipidemia, obesity and sedentary lifestyle. The current treatment provides moderate improvement. There is no history of CVA or heart failure. Identifiable causes of hypertension include a thyroid problem.  Gastroesophageal Reflux She complains of belching, heartburn and a hoarse voice. This is a chronic problem. The current episode started more than 1 year ago. The problem occurs occasionally. Associated symptoms include fatigue. Risk factors include obesity. She has tried a PPI for the symptoms. The treatment provided moderate relief.  Thyroid Problem Presents for follow-up visit. Symptoms include anxiety, constipation, fatigue and hoarse voice. Patient reports no depressed mood. The symptoms have been stable. Her past medical history is significant for hyperlipidemia. There is no history of heart failure.  Anemia Presents for follow-up visit. Symptoms include malaise/fatigue. There has been no bruising/bleeding easily. There is no history of heart failure.  Hyperlipidemia This is a chronic problem. The current episode started more than 1 year ago. The problem is controlled. Recent  lipid tests were reviewed and are normal. Pertinent negatives include no shortness of breath. Current antihyperlipidemic treatment includes statins. The current treatment provides moderate improvement of lipids. Risk factors for coronary artery disease include dyslipidemia, hypertension, a sedentary lifestyle and post-menopausal.  Anxiety Presents for follow-up visit. Symptoms include excessive worry, irritability, nervous/anxious behavior and restlessness. Patient reports no depressed mood or shortness of breath. The severity of symptoms is moderate.   Her past medical history is significant for anemia.  Depression        This is a chronic problem.  The current episode started more than 1 year ago.   The onset quality is gradual.   The problem occurs intermittently.  Associated symptoms include fatigue, irritable, restlessness and sad.  Associated symptoms include no helplessness and no hopelessness.  Past treatments include SNRIs - Serotonin and norepinephrine reuptake inhibitors.  Past medical history includes thyroid problem and anxiety.   Back Pain This is a chronic problem. The current episode started more than 1 year ago. The problem has been waxing and waning since onset. The pain is present in the lumbar spine. The quality of the pain is described as aching. The pain is at a severity of 3/10. The pain is moderate.     Review of Systems  Constitutional:  Positive for fatigue, irritability and malaise/fatigue.  HENT:  Positive for hoarse voice.   Respiratory:  Negative for shortness of breath.   Gastrointestinal:  Positive for constipation and heartburn.  Musculoskeletal:  Positive for back pain.  Hematological:  Does not bruise/bleed easily.  Psychiatric/Behavioral:  Positive for depression. The patient is nervous/anxious.   All other systems reviewed and are negative.     Objective:   Physical Exam Vitals reviewed.  Constitutional:  General: She is irritable. She is not in  acute distress.    Appearance: She is well-developed.  HENT:     Head: Normocephalic and atraumatic.     Right Ear: Tympanic membrane normal.     Left Ear: Tympanic membrane normal.  Eyes:     Pupils: Pupils are equal, round, and reactive to light.  Neck:     Thyroid: No thyromegaly.  Cardiovascular:     Rate and Rhythm: Normal rate and regular rhythm.     Heart sounds: Normal heart sounds. No murmur heard. Pulmonary:     Effort: Pulmonary effort is normal. No respiratory distress.     Breath sounds: Normal breath sounds. No wheezing.  Abdominal:     General: Bowel sounds are normal. There is no distension.     Palpations: Abdomen is soft.     Tenderness: There is no abdominal tenderness.  Musculoskeletal:        General: No tenderness. Normal range of motion.     Cervical back: Normal range of motion and neck supple.  Skin:    General: Skin is warm and dry.  Neurological:     Mental Status: She is alert and oriented to person, place, and time.     Cranial Nerves: No cranial nerve deficit.     Deep Tendon Reflexes: Reflexes are normal and symmetric.  Psychiatric:        Behavior: Behavior normal.        Thought Content: Thought content normal.        Judgment: Judgment normal.      BP 131/82    Pulse 69    Temp 97.8 F (36.6 C) (Temporal)    Ht _0  (1.575 m)    Wt 156 lb (70.8 kg)    BMI 28.53 kg/m      Assessment & Plan:  Brittany Lynch comes in today with chief complaint of Medical Management of Chronic Issues   Diagnosis and orders addressed:  1. Primary hypertension - CMP14+EGFR  2. Gastroesophageal reflux disease, unspecified whether esophagitis present - CMP14+EGFR  3. Hypothyroidism, unspecified type - CMP14+EGFR - TSH  4. Thrombocytosis - CMP14+EGFR  5. Vitamin D deficiency - CMP14+EGFR  6. S/P lumbar laminectomy - CMP14+EGFR  7. S/P cervical spinal fusion - CMP14+EGFR  8. Iron deficiency anemia, unspecified iron deficiency anemia  type - CMP14+EGFR  9. Hyperlipidemia, unspecified hyperlipidemia type - CMP14+EGFR  10. GAD (generalized anxiety disorder) Will change Effexor to Pristiq 100 mg  Will increase Wellbutrin to 300 mg from 150 mg  Stress management  - desvenlafaxine (PRISTIQ) 100 MG 24 hr tablet; Take 1 tablet (100 mg total) by mouth daily.  Dispense: 90 tablet; Refill: 1 - buPROPion (WELLBUTRIN XL) 300 MG 24 hr tablet; Take 1 tablet (300 mg total) by mouth daily.  Dispense: 90 tablet; Refill: 1 - CMP14+EGFR  11. Moderate episode of recurrent major depressive disorder (HCC) - desvenlafaxine (PRISTIQ) 100 MG 24 hr tablet; Take 1 tablet (100 mg total) by mouth daily.  Dispense: 90 tablet; Refill: 1 - buPROPion (WELLBUTRIN XL) 300 MG 24 hr tablet; Take 1 tablet (300 mg total) by mouth daily.  Dispense: 90 tablet; Refill: 1 - CMP14+EGFR  12. Chronic low back pain, unspecified back pain laterality, unspecified whether sciatica present - CMP14+EGFR  13. Constipation, unspecified constipation type Will start Linzess 145 mcg - linaclotide (LINZESS) 145 MCG CAPS capsule; Take 1 capsule (145 mcg total) by mouth daily before breakfast.  Dispense: 90 capsule; Refill: 1 -  CMP14+EGFR  14. Colon cancer screening - Ambulatory referral to Gastroenterology - CMP14+EGFR   Labs pending Health Maintenance reviewed Diet and exercise encouraged  Follow up plan: 3 months   [name], FNP

## 2021-07-24 NOTE — Patient Instructions (Signed)
Fatigue °If you have fatigue, you feel tired all the time and have a lack of energy or a lack of motivation. Fatigue may make it difficult to start or complete tasks because of exhaustion. In general, occasional or mild fatigue is often a normal response to activity or life. However, long-lasting (chronic) or extreme fatigue may be a symptom of a medical condition. °Follow these instructions at home: °General instructions °Watch your fatigue for any changes. °Go to bed and get up at the same time every day. °Avoid fatigue by pacing yourself during the day and getting enough sleep at night. °Maintain a healthy weight. °Medicines °Take over-the-counter and prescription medicines only as told by your health care provider. °Take a multivitamin, if told by your health care provider.  °Do not use herbal or dietary supplements unless they are approved by your health care provider. °Activity ° °Exercise regularly, as told by your health care provider. °Use or practice techniques to help you relax, such as yoga, tai chi, meditation, or massage therapy. °Eating and drinking ° °Avoid heavy meals in the evening. °Eat a well-balanced diet, which includes lean proteins, whole grains, plenty of fruits and vegetables, and low-fat dairy products. °Avoid consuming too much caffeine. °Avoid the use of alcohol. °Drink enough fluid to keep your urine pale yellow. °Lifestyle °Change situations that cause you stress. Try to keep your work and personal schedule in balance. °Do not use any products that contain nicotine or tobacco, such as cigarettes and e-cigarettes. If you need help quitting, ask your health care provider. °Do not use drugs. °Contact a health care provider if: °Your fatigue does not get better. °You have a fever. °You suddenly lose or gain weight. °You have headaches. °You have trouble falling asleep or sleeping through the night. °You feel angry, guilty, anxious, or sad. °You are unable to have a bowel movement  (constipation). °Your skin is dry. °You have swelling in your legs or another part of your body. °Get help right away if: °You feel confused. °Your vision is blurry. °You feel faint or you pass out. °You have a severe headache. °You have severe pain in your abdomen, your back, or the area between your waist and hips (pelvis). °You have chest pain, shortness of breath, or an irregular or fast heartbeat. °You are unable to urinate, or you urinate less than normal. °You have abnormal bleeding, such as bleeding from the rectum, vagina, nose, lungs, or nipples. °You vomit blood. °You have thoughts about hurting yourself or others. °If you ever feel like you may hurt yourself or others, or have thoughts about taking your own life, get help right away. You can go to your nearest emergency department or call: °Your local emergency services (911 in the [place] °A suicide crisis helpline, such as the National Suicide Prevention Lifeline at 1-800-273-8255 or 988 in the [place] This is open 24 hours a day. °Summary °If you have fatigue, you feel tired all the time and have a lack of energy or a lack of motivation. °Fatigue may make it difficult to start or complete tasks because of exhaustion. °Long-lasting (chronic) or extreme fatigue may be a symptom of a medical condition. °Exercise regularly, as told by your health care provider. °Change situations that cause you stress. Try to keep your work and personal schedule in balance. °This information is not intended to replace advice given to you by your health care provider. Make sure you discuss any questions you have with your health care provider. °Document Revised:   01/25/2021 Document Reviewed: 05/12/2020 Elsevier Patient Education  [place].

## 2021-07-28 ENCOUNTER — Telehealth: Payer: Self-pay | Admitting: Family

## 2021-07-28 NOTE — Telephone Encounter (Signed)
Please address as back up provider for [facility] today

## 2021-07-28 NOTE — Telephone Encounter (Signed)
Attempted to reach pt - call would no go through = message spoke of entering a access code.

## 2021-07-28 NOTE — Telephone Encounter (Signed)
Called again = still says - enter mailbox number - NA - [phone] 1/13

## 2021-07-28 NOTE — Telephone Encounter (Signed)
Please call back she has questions about taking desvenlafaxine (PRISTIQ) 100 MG 24 hr tablet and venlafaxine XR (EFFEXOR-XR) 150 MG 24 hr capsule   Pt started feeling dizzy yesterday and contiuning today. She believes it is because she stopped taking the venlafaxine XR (EFFEXOR-XR) 150 MG 24 hr capsule. Was pt suppose to stop this medication without slowing coming off it? Pt aware [facility] is off but preferred to know something today about the dizziness.

## 2021-08-08 NOTE — Telephone Encounter (Signed)
Called and spoke to patient she states she is doing a lot better. Doe not need anything at this time.

## 2021-08-09 DIAGNOSIS — I10 Essential (primary) hypertension: Secondary | ICD-10-CM | POA: Diagnosis not present

## 2021-08-09 DIAGNOSIS — M545 Low back pain, unspecified: Secondary | ICD-10-CM | POA: Diagnosis not present

## 2021-08-09 DIAGNOSIS — M5416 Radiculopathy, lumbar region: Secondary | ICD-10-CM | POA: Diagnosis not present

## 2021-08-21 ENCOUNTER — Other Ambulatory Visit: Payer: Self-pay | Admitting: Family

## 2021-08-21 DIAGNOSIS — N951 Menopausal and female climacteric states: Secondary | ICD-10-CM

## 2021-08-21 DIAGNOSIS — F411 Generalized anxiety disorder: Secondary | ICD-10-CM

## 2021-08-21 DIAGNOSIS — I10 Essential (primary) hypertension: Secondary | ICD-10-CM

## 2021-08-21 DIAGNOSIS — E785 Hyperlipidemia, unspecified: Secondary | ICD-10-CM

## 2021-08-24 ENCOUNTER — Encounter: Payer: Self-pay | Admitting: *Deleted

## 2021-08-25 ENCOUNTER — Encounter (HOSPITAL_COMMUNITY): Payer: Self-pay | Admitting: Hematology

## 2021-08-28 ENCOUNTER — Other Ambulatory Visit: Payer: Medicare Other

## 2021-08-28 DIAGNOSIS — D75839 Thrombocytosis, unspecified: Secondary | ICD-10-CM | POA: Diagnosis not present

## 2021-08-28 DIAGNOSIS — K219 Gastro-esophageal reflux disease without esophagitis: Secondary | ICD-10-CM | POA: Diagnosis not present

## 2021-08-28 DIAGNOSIS — I10 Essential (primary) hypertension: Secondary | ICD-10-CM | POA: Diagnosis not present

## 2021-08-28 DIAGNOSIS — E039 Hypothyroidism, unspecified: Secondary | ICD-10-CM | POA: Diagnosis not present

## 2021-08-28 DIAGNOSIS — E559 Vitamin D deficiency, unspecified: Secondary | ICD-10-CM | POA: Diagnosis not present

## 2021-08-29 LAB — CMP14+EGFR
ALT: 14 IU/L (ref 0–32)
AST: 16 IU/L (ref 0–40)
Albumin/Globulin Ratio: 2.3 — ABNORMAL HIGH (ref 1.2–2.2)
Albumin: 4.8 g/dL (ref 3.8–4.8)
Alkaline Phosphatase: 50 IU/L (ref 44–121)
BUN/Creatinine Ratio: 19 (ref 12–28)
BUN: 17 mg/dL (ref 8–27)
Bilirubin Total: 0.3 mg/dL (ref 0.0–1.2)
CO2: 26 mmol/L (ref 20–29)
Calcium: 9.6 mg/dL (ref 8.7–10.3)
Chloride: 100 mmol/L (ref 96–106)
Creatinine, Ser: 0.89 mg/dL (ref 0.57–1.00)
Globulin, Total: 2.1 g/dL (ref 1.5–4.5)
Glucose: 89 mg/dL (ref 70–99)
Potassium: 4.9 mmol/L (ref 3.5–5.2)
Sodium: 139 mmol/L (ref 134–144)
Total Protein: 6.9 g/dL (ref 6.0–8.5)
eGFR: 74 mL/min/{1.73_m2} (ref >59)

## 2021-08-29 LAB — TSH: TSH: 1.02 u[IU]/mL (ref 0.450–4.500)

## 2021-09-01 ENCOUNTER — Ambulatory Visit (INDEPENDENT_AMBULATORY_CARE_PROVIDER_SITE_OTHER): Payer: Medicare Other

## 2021-09-01 VITALS — Wt 156.0 lb

## 2021-09-01 DIAGNOSIS — Z0000 Encounter for general adult medical examination without abnormal findings: Secondary | ICD-10-CM

## 2021-09-01 DIAGNOSIS — M5412 Radiculopathy, cervical region: Secondary | ICD-10-CM | POA: Insufficient documentation

## 2021-09-01 DIAGNOSIS — M4802 Spinal stenosis, cervical region: Secondary | ICD-10-CM | POA: Insufficient documentation

## 2021-09-01 NOTE — Patient Instructions (Signed)
Ms. Densmore , Thank you for taking time to come for your Medicare Wellness Visit. I appreciate your ongoing commitment to your health goals. Please review the following plan we discussed and let me know if I can assist you in the future.   Screening recommendations/referrals: Colonoscopy: Done 06/18/2011 - Repeat in 10 years *call Dr Roseanne Kaufman office for appointment Mammogram: Done 02/24/2021 - Repeat annually  Bone Density: Due at age 62 unless recommended by provider sooner Recommended yearly ophthalmology/optometry visit for glaucoma screening and checkup Recommended yearly dental visit for hygiene and checkup  Vaccinations: Influenza vaccine: Done 04/28/2021 - Repeat annually  Pneumococcal vaccine: Due now -ask about Prevnar-20 Tdap vaccine: Done 09/02/2012 - Repeat in 10 years Shingles vaccine: Done 07/24/2021 - get second dose at next visit  Covid-19:  Done 11/08/2019, 12/06/2019, & 09/14/2020  Advanced directives: Advance directive discussed with you today. Even though you declined this today, please call our office should you change your mind, and we can give you the proper paperwork for you to fill out.   Conditions/risks identified: Aim for 30 minutes of exercise or brisk walking each day, drink 6-8 glasses of water and eat lots of fruits and vegetables.   Next appointment: Follow up in one year for your annual wellness visit.   Preventive Care 40-64 Years, Female Preventive care refers to lifestyle choices and visits with your health care provider that can promote health and wellness. What does preventive care include? A yearly physical exam. This is also called an annual well check. Dental exams once or twice a year. Routine eye exams. Ask your health care provider how often you should have your eyes checked. Personal lifestyle choices, including: Daily care of your teeth and gums. Regular physical activity. Eating a healthy diet. Avoiding tobacco and drug use. Limiting alcohol  use. Practicing safe sex. Taking low-dose aspirin daily starting at age 43. Taking vitamin and mineral supplements as recommended by your health care provider. What happens during an annual well check? The services and screenings done by your health care provider during your annual well check will depend on your age, overall health, lifestyle risk factors, and family history of disease. Counseling  Your health care provider may ask you questions about your: Alcohol use. Tobacco use. Drug use. Emotional well-being. Home and relationship well-being. Sexual activity. Eating habits. Work and work Statistician. Method of birth control. Menstrual cycle. Pregnancy history. Screening  You may have the following tests or measurements: Height, weight, and BMI. Blood pressure. Lipid and cholesterol levels. These may be checked every 5 years, or more frequently if you are over 80 years old. Skin check. Lung cancer screening. You may have this screening every year starting at age 9 if you have a 30-pack-year history of smoking and currently smoke or have quit within the past 15 years. Fecal occult blood test (FOBT) of the stool. You may have this test every year starting at age 37. Flexible sigmoidoscopy or colonoscopy. You may have a sigmoidoscopy every 5 years or a colonoscopy every 10 years starting at age 40. Hepatitis C blood test. Hepatitis B blood test. Sexually transmitted disease (STD) testing. Diabetes screening. This is done by checking your blood sugar (glucose) after you have not eaten for a while (fasting). You may have this done every 1-3 years. Mammogram. This may be done every 1-2 years. Talk to your health care provider about when you should start having regular mammograms. This may depend on whether you have a family history of breast  cancer. BRCA-related cancer screening. This may be done if you have a family history of breast, ovarian, tubal, or peritoneal cancers. Pelvic  exam and Pap test. This may be done every 3 years starting at age 44. Starting at age 28, this may be done every 5 years if you have a Pap test in combination with an HPV test. Bone density scan. This is done to screen for osteoporosis. You may have this scan if you are at high risk for osteoporosis. Discuss your test results, treatment options, and if necessary, the need for more tests with your health care provider. Vaccines  Your health care provider may recommend certain vaccines, such as: Influenza vaccine. This is recommended every year. Tetanus, diphtheria, and acellular pertussis (Tdap, Td) vaccine. You may need a Td booster every 10 years. Zoster vaccine. You may need this after age 86. Pneumococcal 13-valent conjugate (PCV13) vaccine. You may need this if you have certain conditions and were not previously vaccinated. Pneumococcal polysaccharide (PPSV23) vaccine. You may need one or two doses if you smoke cigarettes or if you have certain conditions. Talk to your health care provider about which screenings and vaccines you need and how often you need them. This information is not intended to replace advice given to you by your health care provider. Make sure you discuss any questions you have with your health care provider. Document Released: 07/29/2015 Document Revised: 03/21/2016 Document Reviewed: 05/03/2015 Elsevier Interactive Patient Education  2017 [place] Prevention in the Home Falls can cause injuries. They can happen to people of all ages. There are many things you can do to make your home safe and to help prevent falls. What can I do on the outside of my home? Regularly fix the edges of walkways and driveways and fix any cracks. Remove anything that might make you trip as you walk through a door, such as a raised step or threshold. Trim any bushes or trees on the path to your home. Use bright outdoor lighting. Clear any walking paths of anything that might  make someone trip, such as rocks or tools. Regularly check to see if handrails are loose or broken. Make sure that both sides of any steps have handrails. Any raised decks and porches should have guardrails on the edges. Have any leaves, snow, or ice cleared regularly. Use sand or salt on walking paths during winter. Clean up any spills in your garage right away. This includes oil or grease spills. What can I do in the bathroom? Use night lights. Install grab bars by the toilet and in the tub and shower. Do not use towel bars as grab bars. Use non-skid mats or decals in the tub or shower. If you need to sit down in the shower, use a plastic, non-slip stool. Keep the floor dry. Clean up any water that spills on the floor as soon as it happens. Remove soap buildup in the tub or shower regularly. Attach bath mats securely with double-sided non-slip rug tape. Do not have throw rugs and other things on the floor that can make you trip. What can I do in the bedroom? Use night lights. Make sure that you have a light by your bed that is easy to reach. Do not use any sheets or blankets that are too big for your bed. They should not hang down onto the floor. Have a firm chair that has side arms. You can use this for support while you get dressed. Do not  have throw rugs and other things on the floor that can make you trip. What can I do in the kitchen? Clean up any spills right away. Avoid walking on wet floors. Keep items that you use a lot in easy-to-reach places. If you need to reach something above you, use a strong step stool that has a grab bar. Keep electrical cords out of the way. Do not use floor polish or wax that makes floors slippery. If you must use wax, use non-skid floor wax. Do not have throw rugs and other things on the floor that can make you trip. What can I do with my stairs? Do not leave any items on the stairs. Make sure that there are handrails on both sides of the stairs  and use them. Fix handrails that are broken or loose. Make sure that handrails are as long as the stairways. Check any carpeting to make sure that it is firmly attached to the stairs. Fix any carpet that is loose or worn. Avoid having throw rugs at the top or bottom of the stairs. If you do have throw rugs, attach them to the floor with carpet tape. Make sure that you have a light switch at the top of the stairs and the bottom of the stairs. If you do not have them, ask someone to add them for you. What else can I do to help prevent falls? Wear shoes that: Do not have high heels. Have rubber bottoms. Are comfortable and fit you well. Are closed at the toe. Do not wear sandals. If you use a stepladder: Make sure that it is fully opened. Do not climb a closed stepladder. Make sure that both sides of the stepladder are locked into place. Ask someone to hold it for you, if possible. Clearly mark and make sure that you can see: Any grab bars or handrails. First and last steps. Where the edge of each step is. Use tools that help you move around (mobility aids) if they are needed. These include: Canes. Walkers. Scooters. Crutches. Turn on the lights when you go into a dark area. Replace any light bulbs as soon as they burn out. Set up your furniture so you have a clear path. Avoid moving your furniture around. If any of your floors are uneven, fix them. If there are any pets around you, be aware of where they are. Review your medicines with your doctor. Some medicines can make you feel dizzy. This can increase your chance of falling. Ask your doctor what other things that you can do to help prevent falls. This information is not intended to replace advice given to you by your health care provider. Make sure you discuss any questions you have with your health care provider. Document Released: 04/28/2009 Document Revised: 12/08/2015 Document Reviewed: 08/06/2014 Elsevier Interactive Patient  Education  2017 [employer].

## 2021-09-01 NOTE — Progress Notes (Signed)
Subjective:   Brittany Lynch is a 61 y.o. female who presents for Medicare Annual (Subsequent) preventive examination.  Virtual Visit via Telephone Note  I connected with  Brittany Lynch on 09/01/21 at  9:00 AM EST by telephone and verified that I am speaking with the correct person using two identifiers.  Location: Patient: Home Provider: [facility] Persons participating in the virtual visit: patient/Nurse Health Advisor   I discussed the limitations, risks, security and privacy concerns of performing an evaluation and management service by telephone and the availability of in person appointments. The patient expressed understanding and agreed to proceed.  Interactive audio and video telecommunications were attempted between this nurse and patient, however failed, due to patient having technical difficulties OR patient did not have access to video capability.  We continued and completed visit with audio only.  Some vital signs may be absent or patient reported.   Maree Ainley E Pericles Carmicheal, LPN   Review of Systems     Cardiac Risk Factors include: advanced age (>68men, >63 women);dyslipidemia;hypertension;sedentary lifestyle;Other (see comment), Risk factor comments: anemia, thrombocytosis     Objective:    Today's Vitals   09/01/21 0858  Weight: 156 lb (70.8 kg)  PainSc: 4    Body mass index is 28.53 kg/m.  Advanced Directives 09/01/2021 07/04/2021 05/04/2021 04/28/2021 02/06/2021 01/06/2021 12/08/2020  Does Patient Have a Medical Advance Directive? No No No No No No No  Would patient like information on creating a medical advance directive? No - Patient declined No - Patient declined No - Patient declined No - Patient declined No - Patient declined No - Patient declined No - Patient declined  Pre-existing out of facility DNR order (yellow form or pink MOST form) - - - - Yellow form placed in chart (order not valid for inpatient use) - -    Current Medications (verified) Outpatient  Encounter Medications as of 09/01/2021  Medication Sig   amoxicillin-clavulanate (AUGMENTIN) 875-125 MG tablet Take 1 tablet by mouth 2 (two) times daily.   atorvastatin (LIPITOR) 20 MG tablet Take 1 tablet (20 mg total) by mouth daily.   buPROPion (WELLBUTRIN XL) 300 MG 24 hr tablet Take 1 tablet (300 mg total) by mouth daily.   busPIRone (BUSPAR) 10 MG tablet TAKE ONE TABLET BY MOUTH THREE TIMES DAILY   chlorhexidine (PERIDEX) 0.12 % solution 15 mLs 2 (two) times daily.   desvenlafaxine (PRISTIQ) 100 MG 24 hr tablet Take 1 tablet (100 mg total) by mouth daily.   diazepam (VALIUM) 5 MG tablet SMARTSIG:1 Tablet(s) By Mouth   diphenhydrAMINE (BENADRYL) 25 mg capsule Take 25 mg by mouth every 6 (six) hours as needed for itching (after neck injections).   estradiol (ESTRACE) 2 MG tablet Take 1 tablet (2 mg total) by mouth daily.   fenofibrate (TRICOR) 145 MG tablet Take 1 tablet (145 mg total) by mouth daily.   ferrous sulfate 325 (65 FE) MG tablet Take 325 mg by mouth at bedtime.   ibuprofen (ADVIL) 800 MG tablet Take 800 mg by mouth every 8 (eight) hours as needed.   levothyroxine (SYNTHROID) 100 MCG tablet Take 1 tablet (100 mcg total) by mouth daily.   linaclotide (LINZESS) 145 MCG CAPS capsule Take 1 capsule (145 mcg total) by mouth daily before breakfast.   methocarbamol (ROBAXIN) 750 MG tablet Take 750 mg by mouth every 6 (six) hours as needed for muscle spasms.   metoprolol tartrate (LOPRESSOR) 50 MG tablet TAKE ONE TABLET BY MOUTH TWICE DAILY   Multiple  Vitamin (MULTIVITAMIN WITH MINERALS) TABS tablet Take 1 tablet by mouth daily. Women's 50+   omeprazole (PRILOSEC) 40 MG capsule TAKE ONE CAPSULE BY MOUTH EVERY DAY   Oxycodone HCl 10 MG TABS Take 10 mg by mouth every 6 (six) hours as needed for severe pain.   pramipexole (MIRAPEX) 0.5 MG tablet TAKE TWO TABLETS BY MOUTH EVERY EVENING   No facility-administered encounter medications on file as of 09/01/2021.    Allergies  (verified) Chocolate, Iodinated contrast media, Methotrexate derivatives, Lisinopril, and Tape   History: Past Medical History:  Diagnosis Date   Anemia    Anxiety    Arthritis    spine/neck   Depression    GERD (gastroesophageal reflux disease)    Headache    Hyperlipidemia    Hypertension    Hypothyroidism    Iron deficiency 04/28/2021   Thyroid disease    Past Surgical History:  Procedure Laterality Date   ABDOMINAL HYSTERECTOMY     ANTERIOR CERVICAL DECOMP/DISCECTOMY FUSION N/A 02/08/2021   Procedure: CERVICAL FOUR-FIVE ANTERIOR CERVICAL DECOMPRESSION/DISCECTOMY FUSION;  Surgeon: Eustace Moore, MD;  Location: [place];  Service: Neurosurgery;  Laterality: N/A;  3C   BACK SURGERY     CATARACT EXTRACTION Bilateral    EYE SURGERY     LUMBAR LAMINECTOMY/DECOMPRESSION MICRODISCECTOMY Bilateral 03/06/2018   Procedure: Laminectomy and Foraminotomy - Lumbar Two-Three, Three-Four bilateral;  Surgeon: Eustace Moore, MD;  Location: [place];  Service: Neurosurgery;  Laterality: Bilateral;  Laminectomy and Foraminotomy - Lumbar Two-Three, Three-Four bilateral   NECK SURGERY     Right hand and wrist surgery Right    Laceration    SPINE SURGERY     Family History  Problem Relation Age of Onset   Mental illness Mother    Alcohol abuse Father    Diabetes Father    Heart disease Father    Social History   Socioeconomic History   Marital status: Legally Separated    Spouse name: Not on file   Number of children: 3   Years of education: Not on file   Highest education level: 9th grade  Occupational History   Occupation: Retired  Tobacco Use   Smoking status: Former    Types: Cigarettes    Quit date: 07/16/2001    Years since quitting: 20.1   Smokeless tobacco: Never  Vaping Use   Vaping Use: Never used  Substance and Sexual Activity   Alcohol use: No    Comment: hx alcoholism. none since age 75   Drug use: Yes    Comment: once every 2 weeks [facility])   Sexual activity: Yes   Other Topics Concern   Not on file  Social History Narrative   Lives in apartment basement of daughters home    Social Determinants of Health   Financial Resource Strain: Low Risk    Difficulty of Paying Living Expenses: Not very hard  Food Insecurity: Food Insecurity Present   Worried About Running Out of Food in the Last Year: Sometimes true   Ran Out of Food in the Last Year: Never true  Transportation Needs: No Transportation Needs   Lack of Transportation (Medical): No   Lack of Transportation (Non-Medical): No  Physical Activity: Insufficiently Active   Days of Exercise per Week: 7 days   Minutes of Exercise per Session: 10 min  Stress: No Stress Concern Present   Feeling of Stress : Only a little  Social Connections: Moderately Integrated   Frequency of Communication with Friends and Family:  More than three times a week   Frequency of Social Gatherings with Friends and Family: Never   Attends Religious Services: More than 4 times per year   Active Member of Clubs or Organizations: Yes   Attends Archivist Meetings: 1 to 4 times per year   Marital Status: Separated    Tobacco Counseling Counseling given: Not Answered   Clinical Intake:  Pre-visit preparation completed: Yes  Pain : 0-10 Pain Score: 4  Pain Type: Chronic pain Pain Location: Back Pain Orientation: Lower Pain Descriptors / Indicators: Aching, Sharp, Sore, Discomfort Pain Onset: More than a month ago Pain Frequency: Intermittent     BMI - recorded: 28.53 Nutritional Status: BMI 25 -29 Overweight Nutritional Risks: None Diabetes: No  How often do you need to have someone help you when you read instructions, pamphlets, or other written materials from your doctor or pharmacy?: 3 - Sometimes  Diabetic? no  Interpreter Needed?: No  Information entered by :: Georgena Weisheit, LPN   Activities of Daily Living In your present state of health, do you have any difficulty performing the  following activities: 09/01/2021 08/28/2021  Hearing? N N  Vision? N N  Difficulty concentrating or making decisions? N N  Walking or climbing stairs? Y Y  Dressing or bathing? N N  Doing errands, shopping? N N  Preparing Food and eating ? N N  Using the Toilet? N N  In the past six months, have you accidently leaked urine? Y N  Comment urgency -  Do you have problems with loss of bowel control? N N  Managing your Medications? N N  Managing your Finances? N N  Housekeeping or managing your Housekeeping? Tempie Donning  Some recent data might be hidden    Patient Care Team: Sharion Balloon, FNP as PCP - General (Family Medicine) Gala Romney Cristopher Estimable, MD as Consulting Physician (Gastroenterology) Konrad Saha as Physician Assistant (Oncology) Reece Agar, MD as Consulting Physician (Pain Medicine)  Indicate any recent Medical Services you may have received from other than [facility] providers in the past year (date may be approximate).     Assessment:   This is a routine wellness examination for Brittany Lynch.  Hearing/Vision screen Hearing Screening - Comments:: Denies hearing difficulties   Vision Screening - Comments:: Wears rx glasses - up to date with routine eye exams with [facility]  Dietary issues and exercise activities discussed: Current Exercise Habits: The patient does not participate in regular exercise at present, Exercise limited by: orthopedic condition(s)   Goals Addressed             This Visit's Progress    Exercise 3x per week (30 min per time)   On track    Try to exercise for at least 30 minutes, 3 times weekly       Depression Screen PHQ 2/9 Scores 09/01/2021 11/17/2020 08/31/2020 11/18/2019 09/24/2019 08/27/2019 08/25/2018  PHQ - 2 Score 0 0 1 0 3 0 6  PHQ- 9 Score - - - - 14 - 13    Fall Risk Fall Risk  09/01/2021 08/28/2021 01/19/2021 11/17/2020 08/31/2020  Falls in the past year? 0 0 0 1 1  Number falls in past yr: 0 0 - 0 1  Injury with Fall? 0 0  - 1 1  Risk for fall due to : No Fall Risks - - History of fall(s) History of fall(s)  Follow up Falls prevention discussed - - Education provided Falls evaluation  completed    FALL RISK PREVENTION PERTAINING TO THE HOME:  Any stairs in or around the home? No  If so, are there any without handrails? No  Home free of loose throw rugs in walkways, pet beds, electrical cords, etc? Yes  Adequate lighting in your home to reduce risk of falls? Yes   ASSISTIVE DEVICES UTILIZED TO PREVENT FALLS:  Life alert? No  Use of a cane, walker or w/c? No  Grab bars in the bathroom? No  Shower chair or bench in shower? Yes  Elevated toilet seat or a handicapped toilet? Yes   TIMED UP AND GO:  Was the test performed? No . Telephonic visit  Cognitive Function:Normal cognitive status assessed by direct observation by this Nurse Health Advisor. No abnormalities found.   MMSE - Mini Mental State Exam 08/25/2018  Orientation to time 5  Orientation to Place 5  Registration 3  Attention/ Calculation 5  Recall 2  Language- name 2 objects 2  Language- repeat 1  Language- follow 3 step command 3  Language- read & follow direction 1  Write a sentence 1  Copy design 1  Total score 29     6CIT Screen 08/31/2020 08/27/2019  What Year? 0 points 0 points  What month? 0 points 0 points  What time? 0 points 0 points  Count back from 20 0 points 0 points  Months in reverse 0 points 0 points  Repeat phrase 0 points 0 points  Total Score 0 0    Immunizations Immunization History  Administered Date(s) Administered   H1N1 06/01/2008   Influenza Split 04/26/2013, 04/19/2015   Influenza,inj,Quad PF,6+ Mos 05/06/2017, 08/14/2018, 07/26/2020, 04/28/2021   Influenza-Unspecified 07/17/2011   Moderna SARS-COV2 Booster Vaccination 09/14/2020   Moderna Sars-Covid-2 Vaccination 11/08/2019, 12/06/2019   Tdap 09/02/2012   Zoster Recombinat (Shingrix) 07/24/2021    TDAP status: Up to date  Flu Vaccine status:  Up to date  Pneumococcal vaccine status: Due, Education has been provided regarding the importance of this vaccine. Advised may receive this vaccine at local pharmacy or Health Dept. Aware to provide a copy of the vaccination record if obtained from local pharmacy or Health Dept. Verbalized acceptance and understanding.  Covid-19 vaccine status: Completed vaccines  Qualifies for Shingles Vaccine? Yes   Zostavax completed No   Shingrix Completed?: No.    Education has been provided regarding the importance of this vaccine. Patient has been advised to call insurance company to determine out of pocket expense if they have not yet received this vaccine. Advised may also receive vaccine at local pharmacy or Health Dept. Verbalized acceptance and understanding.  Screening Tests Health Maintenance  Topic Date Due   COVID-19 Vaccine (3 - Booster for Moderna series) 11/09/2020   COLONOSCOPY (Pts 45-30yrs Insurance coverage will need to be confirmed)  07/24/2022 (Originally 06/17/2021)   Zoster Vaccines- Shingrix (2 of 2) 09/18/2021   TETANUS/TDAP  09/02/2022   MAMMOGRAM  01/24/2023   INFLUENZA VACCINE  Completed   Hepatitis C Screening  Completed   HIV Screening  Completed   HPV VACCINES  Aged Out    Health Maintenance  Health Maintenance Due  Topic Date Due   COVID-19 Vaccine (3 - Booster for Moderna series) 11/09/2020    Colorectal cancer screening: Type of screening: Colonoscopy. Completed 06/18/2011. Repeat every 10 years - says she called GI and they told her she is due next year  Mammogram status: Completed 02/24/2021. Repeat every year  Bone Density Status: Not yet due  Lung Cancer Screening: (Low Dose CT Chest recommended if Age 25-80 years, 30 pack-year currently smoking OR have quit w/in 15years.) does not qualify.  Additional Screening:  Hepatitis C Screening: does qualify; Completed 10/17/2017  Vision Screening: Recommended annual ophthalmology exams for early detection of  glaucoma and other disorders of the eye. Is the patient up to date with their annual eye exam?  Yes  Who is the provider or what is the name of the office in which the patient attends annual eye exams? [place] If pt is not established with a provider, would they like to be referred to a provider to establish care? No .   Dental Screening: Recommended annual dental exams for proper oral hygiene  Community Resource Referral / Chronic Care Management: CRR required this visit?  No   CCM required this visit?  No      Plan:     I have personally reviewed and noted the following in the patients chart:   Medical and social history Use of alcohol, tobacco or illicit drugs  Current medications and supplements including opioid prescriptions.  Functional ability and status Nutritional status Physical activity Advanced directives List of other physicians Hospitalizations, surgeries, and ER visits in previous 12 months Vitals Screenings to include cognitive, depression, and falls Referrals and appointments  In addition, I have reviewed and discussed with patient certain preventive protocols, quality metrics, and best practice recommendations. A written personalized care plan for preventive services as well as general preventive health recommendations were provided to patient.     Sandrea Hammond, LPN   2/70/7867   Nurse Notes: none

## 2021-09-18 ENCOUNTER — Other Ambulatory Visit: Payer: Self-pay

## 2021-09-18 ENCOUNTER — Other Ambulatory Visit: Payer: Self-pay | Admitting: Family

## 2021-09-18 DIAGNOSIS — E785 Hyperlipidemia, unspecified: Secondary | ICD-10-CM

## 2021-09-18 DIAGNOSIS — F411 Generalized anxiety disorder: Secondary | ICD-10-CM

## 2021-09-18 MED ORDER — ATORVASTATIN CALCIUM 20 MG PO TABS: 20.0000 mg | ORAL_TABLET | Freq: Every day | ORAL | 0 refills | Status: DC

## 2021-09-20 ENCOUNTER — Encounter: Payer: Medicare Other | Admitting: Family

## 2021-09-26 ENCOUNTER — Ambulatory Visit (INDEPENDENT_AMBULATORY_CARE_PROVIDER_SITE_OTHER): Payer: Medicare Other | Admitting: Family

## 2021-09-26 ENCOUNTER — Encounter: Payer: Self-pay | Admitting: Family

## 2021-09-26 VITALS — BP 130/82 | HR 68 | Temp 97.7°F | Ht 62.0 in | Wt 160.0 lb

## 2021-09-26 DIAGNOSIS — Z0000 Encounter for general adult medical examination without abnormal findings: Secondary | ICD-10-CM | POA: Diagnosis not present

## 2021-09-26 DIAGNOSIS — E785 Hyperlipidemia, unspecified: Secondary | ICD-10-CM | POA: Diagnosis not present

## 2021-09-26 DIAGNOSIS — G8929 Other chronic pain: Secondary | ICD-10-CM | POA: Diagnosis not present

## 2021-09-26 DIAGNOSIS — E559 Vitamin D deficiency, unspecified: Secondary | ICD-10-CM

## 2021-09-26 DIAGNOSIS — K5900 Constipation, unspecified: Secondary | ICD-10-CM | POA: Diagnosis not present

## 2021-09-26 DIAGNOSIS — F331 Major depressive disorder, recurrent, moderate: Secondary | ICD-10-CM

## 2021-09-26 DIAGNOSIS — F411 Generalized anxiety disorder: Secondary | ICD-10-CM

## 2021-09-26 DIAGNOSIS — E039 Hypothyroidism, unspecified: Secondary | ICD-10-CM | POA: Diagnosis not present

## 2021-09-26 DIAGNOSIS — I10 Essential (primary) hypertension: Secondary | ICD-10-CM | POA: Diagnosis not present

## 2021-09-26 DIAGNOSIS — Z0001 Encounter for general adult medical examination with abnormal findings: Secondary | ICD-10-CM

## 2021-09-26 DIAGNOSIS — K219 Gastro-esophageal reflux disease without esophagitis: Secondary | ICD-10-CM | POA: Diagnosis not present

## 2021-09-26 DIAGNOSIS — M545 Low back pain, unspecified: Secondary | ICD-10-CM

## 2021-09-26 DIAGNOSIS — Z23 Encounter for immunization: Secondary | ICD-10-CM

## 2021-09-26 DIAGNOSIS — E611 Iron deficiency: Secondary | ICD-10-CM

## 2021-09-26 NOTE — Patient Instructions (Signed)

## 2021-09-26 NOTE — Progress Notes (Signed)
? ?Subjective:  ? ? Patient ID: Brittany Lynch, female    DOB: Jun 12, 1961, 61 y.o.   MRN: [id number] ? ?Chief Complaint  ?Patient presents with  ? Annual Exam  ? ?Pt presents to the office today for CPE without pap. She has had a hysterectomy. She is followed by Neurosurgeon and pain management every month and has hx of lumbar laminectomy and hx of cervical spinal fusion. She is followed by Hematologists every 6 months for iron deficiency and thrombocytosis.  ?  ?She is complaining of increase fatigue. She feels overwhelmed with doing tasks.   ?Hypertension ?This is a chronic problem. The current episode started more than 1 year ago. The problem has been resolved since onset. The problem is controlled. Associated symptoms include anxiety and malaise/fatigue. Pertinent negatives include no peripheral edema or shortness of breath. Risk factors for coronary artery disease include dyslipidemia, obesity and sedentary lifestyle. The current treatment provides moderate improvement. Identifiable causes of hypertension include a thyroid problem.  ?Gastroesophageal Reflux ?She complains of belching and heartburn. This is a chronic problem. The current episode started more than 1 year ago. The problem occurs occasionally. Associated symptoms include fatigue. She has tried a PPI for the symptoms. The treatment provided moderate relief.  ?Thyroid Problem ?Presents for follow-up visit. Symptoms include anxiety, constipation, depressed mood, dry skin and fatigue. Patient reports no nail problem. The symptoms have been stable.  ?Anemia ?Presents for follow-up visit. Symptoms include malaise/fatigue. There has been no fever.  ?Anxiety ?Presents for follow-up visit. Symptoms include depressed mood, excessive worry, irritability, nervous/anxious behavior and restlessness. Patient reports no shortness of breath. Symptoms occur most days. The severity of symptoms is moderate.  ? ?Her past medical history is significant for anemia.   ?Depression ?       This is a chronic problem.  The current episode started more than 1 year ago.   The problem occurs intermittently.  Associated symptoms include fatigue, restlessness and sad.  Associated symptoms include no helplessness and no hopelessness.  Past medical history includes thyroid problem and anxiety.   ?Constipation ?This is a chronic problem. The current episode started more than 1 year ago. The problem has been waxing and waning since onset. Associated symptoms include back pain. Pertinent negatives include no fever. She has tried laxatives for the symptoms. The treatment provided moderate relief.  ?Back Pain ?This is a chronic problem. The current episode started more than 1 year ago. The problem occurs intermittently. The problem has been waxing and waning since onset. The pain is present in the lumbar spine. The quality of the pain is described as aching. The pain is moderate. Pertinent negatives include no fever. She has tried analgesics for the symptoms. The treatment provided mild relief.  ? ? ? ?Review of Systems  ?Constitutional:  Positive for fatigue, irritability and malaise/fatigue. Negative for fever.  ?Respiratory:  Negative for shortness of breath.   ?Gastrointestinal:  Positive for constipation and heartburn.  ?Musculoskeletal:  Positive for back pain.  ?Psychiatric/Behavioral:  Positive for depression. The patient is nervous/anxious.   ?All other systems reviewed and are negative. ? ?Family History  ?Problem Relation Age of Onset  ? Mental illness Mother   ? Alcohol abuse Father   ? Diabetes Father   ? Heart disease Father   ? ?Social History  ? ?Socioeconomic History  ? Marital status: Legally Separated  ?  Spouse name: Not on file  ? Number of children: 3  ? Years of education: Not on  file  ? Highest education level: 9th grade  ?Occupational History  ? Occupation: Retired  ?Tobacco Use  ? Smoking status: Former  ?  Types: Cigarettes  ?  Quit date: 07/16/2001  ?  Years since  quitting: 20.2  ? Smokeless tobacco: Never  ?Vaping Use  ? Vaping Use: Never used  ?Substance and Sexual Activity  ? Alcohol use: No  ?  Comment: hx alcoholism. none since age 30  ? Drug use: Yes  ?  Comment: once every 2 weeks (THC)  ? Sexual activity: Yes  ?Other Topics Concern  ? Not on file  ?Social History Narrative  ? Lives in apartment basement of daughters home   ? ?Social Determinants of Health  ? ?Financial Resource Strain: Low Risk   ? Difficulty of Paying Living Expenses: Not very hard  ?Food Insecurity: Food Insecurity Present  ? Worried About Charity fundraiser in the Last Year: Sometimes true  ? Ran Out of Food in the Last Year: Never true  ?Transportation Needs: No Transportation Needs  ? Lack of Transportation (Medical): No  ? Lack of Transportation (Non-Medical): No  ?Physical Activity: Insufficiently Active  ? Days of Exercise per Week: 7 days  ? Minutes of Exercise per Session: 10 min  ?Stress: No Stress Concern Present  ? Feeling of Stress : Only a little  ?Social Connections: Moderately Integrated  ? Frequency of Communication with Friends and Family: More than three times a week  ? Frequency of Social Gatherings with Friends and Family: Never  ? Attends Religious Services: More than 4 times per year  ? Active Member of Clubs or Organizations: Yes  ? Attends Archivist Meetings: 1 to 4 times per year  ? Marital Status: Separated  ? ? ?   ?Objective:  ? Physical Exam ?Vitals reviewed.  ?Constitutional:   ?   General: She is not in acute distress. ?   Appearance: She is well-developed.  ?HENT:  ?   Head: Normocephalic and atraumatic.  ?   Right Ear: Tympanic membrane normal.  ?   Left Ear: Tympanic membrane normal.  ?Eyes:  ?   Pupils: Pupils are equal, round, and reactive to light.  ?Neck:  ?   Thyroid: No thyromegaly.  ?Cardiovascular:  ?   Rate and Rhythm: Normal rate and regular rhythm.  ?   Heart sounds: Normal heart sounds. No murmur heard. ?Pulmonary:  ?   Effort: Pulmonary  effort is normal. No respiratory distress.  ?   Breath sounds: Normal breath sounds. No wheezing.  ?Abdominal:  ?   General: Bowel sounds are normal. There is no distension.  ?   Palpations: Abdomen is soft.  ?   Tenderness: There is no abdominal tenderness.  ?Musculoskeletal:     ?   General: No tenderness. Normal range of motion.  ?   Cervical back: Normal range of motion and neck supple.  ?Skin: ?   General: Skin is warm and dry.  ?Neurological:  ?   Mental Status: She is alert and oriented to person, place, and time.  ?   Cranial Nerves: No cranial nerve deficit.  ?   Deep Tendon Reflexes: Reflexes are normal and symmetric.  ?Psychiatric:     ?   Behavior: Behavior normal.     ?   Thought Content: Thought content normal.     ?   Judgment: Judgment normal.  ? ? ? ? ?BP 130/82   Pulse 68   Temp 97.7 ?  F (36.5 ?C) (Temporal)   Ht _0  (1.575 m)   Wt 160 lb (72.6 kg)   BMI 29.26 kg/m?  ? ?   ?Assessment & Plan:  ?Brittany Lynch comes in today with chief complaint of Annual Exam ? ? ?Diagnosis and orders addressed: ? ?1. Primary hypertension ?- CMP14+EGFR ?- CBC with Differential/Platelet ? ?2. Gastroesophageal reflux disease, unspecified whether esophagitis present ?- CMP14+EGFR ?- CBC with Differential/Platelet ? ?3. Hypothyroidism, unspecified type ? ?- CMP14+EGFR ?- CBC with Differential/Platelet ?- TSH ? ?4. Iron deficiency ?- CMP14+EGFR ?- CBC with Differential/Platelet ? ?5. Constipation, unspecified constipation type ?- CMP14+EGFR ?- CBC with Differential/Platelet ? ?6. Vitamin D deficiency ?- CMP14+EGFR ?- CBC with Differential/Platelet ? ?7. Chronic low back pain, unspecified back pain laterality, unspecified whether sciatica present ? ?- CMP14+EGFR ?- CBC with Differential/Platelet ? ?8. Moderate episode of recurrent major depressive disorder ([place]) ?- CMP14+EGFR ?- CBC with Differential/Platelet ? ?9. GAD (generalized anxiety disorder) ?- CMP14+EGFR ?- CBC with Differential/Platelet ? ?10.  Hyperlipidemia, unspecified hyperlipidemia type ?- CMP14+EGFR ?- CBC with Differential/Platelet ?- Lipid panel ? ?11. Annual physical exam ?- CMP14+EGFR ?- CBC with Differential/Platelet ?- Lipid panel ?- TSH ? ? ?Labs pending

## 2021-09-27 LAB — LIPID PANEL
Chol/HDL Ratio: 2.5 ratio (ref 0.0–4.4)
Cholesterol, Total: 201 mg/dL — ABNORMAL HIGH (ref 100–199)
HDL: 81 mg/dL (ref >39)
LDL Chol Calc (NIH): 101 mg/dL — ABNORMAL HIGH (ref 0–99)
Triglycerides: 109 mg/dL (ref 0–149)
VLDL Cholesterol Cal: 19 mg/dL (ref 5–40)

## 2021-09-27 LAB — CBC WITH DIFFERENTIAL/PLATELET
Basophils Absolute: 0.1 10*3/uL (ref 0.0–0.2)
Basos: 1 %
EOS (ABSOLUTE): 0.2 10*3/uL (ref 0.0–0.4)
Eos: 2 %
Hematocrit: 41.2 % (ref 34.0–46.6)
Hemoglobin: 14.2 g/dL (ref 11.1–15.9)
Immature Grans (Abs): 0 10*3/uL (ref 0.0–0.1)
Immature Granulocytes: 0 %
Lymphocytes Absolute: 2.7 10*3/uL (ref 0.7–3.1)
Lymphs: 36 %
MCH: 30 pg (ref 26.6–33.0)
MCHC: 34.5 g/dL (ref 31.5–35.7)
MCV: 87 fL (ref 79–97)
Monocytes Absolute: 0.6 10*3/uL (ref 0.1–0.9)
Monocytes: 8 %
Neutrophils Absolute: 3.8 10*3/uL (ref 1.4–7.0)
Neutrophils: 53 %
Platelets: 515 10*3/uL — ABNORMAL HIGH (ref 150–450)
RBC: 4.73 x10E6/uL (ref 3.77–5.28)
RDW: 12.3 % (ref 11.7–15.4)
WBC: 7.3 10*3/uL (ref 3.4–10.8)

## 2021-09-27 LAB — CMP14+EGFR
ALT: 17 IU/L (ref 0–32)
AST: 17 IU/L (ref 0–40)
Albumin/Globulin Ratio: 2.2 (ref 1.2–2.2)
Albumin: 5.1 g/dL — ABNORMAL HIGH (ref 3.8–4.8)
Alkaline Phosphatase: 52 IU/L (ref 44–121)
BUN/Creatinine Ratio: 15 (ref 12–28)
BUN: 12 mg/dL (ref 8–27)
Bilirubin Total: 0.2 mg/dL (ref 0.0–1.2)
CO2: 24 mmol/L (ref 20–29)
Calcium: 10 mg/dL (ref 8.7–10.3)
Chloride: 101 mmol/L (ref 96–106)
Creatinine, Ser: 0.82 mg/dL (ref 0.57–1.00)
Globulin, Total: 2.3 g/dL (ref 1.5–4.5)
Glucose: 93 mg/dL (ref 70–99)
Potassium: 5.2 mmol/L (ref 3.5–5.2)
Sodium: 141 mmol/L (ref 134–144)
Total Protein: 7.4 g/dL (ref 6.0–8.5)
eGFR: 81 mL/min/{1.73_m2} (ref >59)

## 2021-09-27 LAB — TSH: TSH: 0.866 u[IU]/mL (ref 0.450–4.500)

## 2021-09-28 DIAGNOSIS — M5416 Radiculopathy, lumbar region: Secondary | ICD-10-CM | POA: Diagnosis not present

## 2021-10-18 ENCOUNTER — Ambulatory Visit: Payer: Commercial Managed Care - HMO | Admitting: Family

## 2021-10-18 ENCOUNTER — Other Ambulatory Visit: Payer: Self-pay | Admitting: Family

## 2021-10-18 DIAGNOSIS — G2581 Restless legs syndrome: Secondary | ICD-10-CM

## 2021-10-23 ENCOUNTER — Ambulatory Visit: Payer: Commercial Managed Care - HMO | Admitting: Family

## 2021-11-15 ENCOUNTER — Other Ambulatory Visit: Payer: Self-pay | Admitting: Family

## 2021-11-15 DIAGNOSIS — I10 Essential (primary) hypertension: Secondary | ICD-10-CM

## 2021-11-15 DIAGNOSIS — N951 Menopausal and female climacteric states: Secondary | ICD-10-CM

## 2021-11-15 DIAGNOSIS — E785 Hyperlipidemia, unspecified: Secondary | ICD-10-CM

## 2021-11-15 DIAGNOSIS — K5900 Constipation, unspecified: Secondary | ICD-10-CM

## 2021-11-15 DIAGNOSIS — F411 Generalized anxiety disorder: Secondary | ICD-10-CM

## 2021-11-16 NOTE — Addendum Note (Signed)
Addended by: Antonietta Barcelona D on: 11/16/2021 08:53 AM ? ? Modules accepted: Orders ? ?

## 2021-12-06 DIAGNOSIS — M5416 Radiculopathy, lumbar region: Secondary | ICD-10-CM | POA: Diagnosis not present

## 2021-12-06 DIAGNOSIS — M5412 Radiculopathy, cervical region: Secondary | ICD-10-CM | POA: Diagnosis not present

## 2021-12-13 ENCOUNTER — Encounter: Payer: Self-pay | Admitting: Family

## 2021-12-13 ENCOUNTER — Other Ambulatory Visit: Payer: Self-pay | Admitting: Family

## 2021-12-13 DIAGNOSIS — K5900 Constipation, unspecified: Secondary | ICD-10-CM

## 2021-12-13 DIAGNOSIS — F411 Generalized anxiety disorder: Secondary | ICD-10-CM

## 2021-12-13 DIAGNOSIS — F331 Major depressive disorder, recurrent, moderate: Secondary | ICD-10-CM

## 2021-12-13 NOTE — Telephone Encounter (Signed)
[facility]. Needs 6 mos ckup in Sept. RF sent

## 2021-12-13 NOTE — Telephone Encounter (Signed)
Called to schedule appt, no answer. Left message and sent letter.

## 2021-12-26 ENCOUNTER — Encounter (HOSPITAL_COMMUNITY): Payer: Self-pay | Admitting: Hematology

## 2022-01-02 ENCOUNTER — Other Ambulatory Visit (HOSPITAL_COMMUNITY): Payer: Self-pay | Admitting: *Deleted

## 2022-01-02 ENCOUNTER — Other Ambulatory Visit: Payer: Medicare Other

## 2022-01-02 ENCOUNTER — Other Ambulatory Visit: Payer: Self-pay

## 2022-01-02 ENCOUNTER — Inpatient Hospital Stay (HOSPITAL_COMMUNITY): Payer: Medicare Other

## 2022-01-02 DIAGNOSIS — E611 Iron deficiency: Secondary | ICD-10-CM

## 2022-01-02 DIAGNOSIS — D75839 Thrombocytosis, unspecified: Secondary | ICD-10-CM | POA: Diagnosis not present

## 2022-01-02 NOTE — Addendum Note (Signed)
Addended by: Renda Rolls A on: 01/02/2022 10:23 AM   Modules accepted: Orders

## 2022-01-03 LAB — CBC WITH DIFFERENTIAL/PLATELET
Basophils Absolute: 0.1 10*3/uL (ref 0.0–0.2)
Basos: 1 %
EOS (ABSOLUTE): 0.1 10*3/uL (ref 0.0–0.4)
Eos: 2 %
Hematocrit: 40.8 % (ref 34.0–46.6)
Hemoglobin: 13.3 g/dL (ref 11.1–15.9)
Immature Grans (Abs): 0 10*3/uL (ref 0.0–0.1)
Immature Granulocytes: 0 %
Lymphocytes Absolute: 2.2 10*3/uL (ref 0.7–3.1)
Lymphs: 37 %
MCH: 28.3 pg (ref 26.6–33.0)
MCHC: 32.6 g/dL (ref 31.5–35.7)
MCV: 87 fL (ref 79–97)
Monocytes Absolute: 0.6 10*3/uL (ref 0.1–0.9)
Monocytes: 9 %
Neutrophils Absolute: 3 10*3/uL (ref 1.4–7.0)
Neutrophils: 51 %
Platelets: 412 10*3/uL (ref 150–450)
RBC: 4.7 x10E6/uL (ref 3.77–5.28)
RDW: 12.7 % (ref 11.7–15.4)
WBC: 5.9 10*3/uL (ref 3.4–10.8)

## 2022-01-03 LAB — IRON AND TIBC
Iron Saturation: 24 % (ref 15–55)
Iron: 100 ug/dL (ref 27–139)
Total Iron Binding Capacity: 425 ug/dL (ref 250–450)
UIBC: 325 ug/dL (ref 118–369)

## 2022-01-03 LAB — FERRITIN: Ferritin: 200 ng/mL — ABNORMAL HIGH (ref 15–150)

## 2022-01-09 ENCOUNTER — Inpatient Hospital Stay (HOSPITAL_COMMUNITY): Payer: Medicare Other | Attending: Physician Assistant | Admitting: Physician Assistant

## 2022-01-09 ENCOUNTER — Other Ambulatory Visit: Payer: Self-pay | Admitting: Family

## 2022-01-09 VITALS — BP 138/80 | HR 66 | Temp 96.6°F | Resp 18 | Ht 61.42 in | Wt 161.8 lb

## 2022-01-09 DIAGNOSIS — D75839 Thrombocytosis, unspecified: Secondary | ICD-10-CM | POA: Insufficient documentation

## 2022-01-09 DIAGNOSIS — Z79899 Other long term (current) drug therapy: Secondary | ICD-10-CM | POA: Insufficient documentation

## 2022-01-09 DIAGNOSIS — E785 Hyperlipidemia, unspecified: Secondary | ICD-10-CM

## 2022-01-09 DIAGNOSIS — Z87891 Personal history of nicotine dependence: Secondary | ICD-10-CM | POA: Diagnosis not present

## 2022-01-09 DIAGNOSIS — F32A Depression, unspecified: Secondary | ICD-10-CM | POA: Insufficient documentation

## 2022-01-09 DIAGNOSIS — E538 Deficiency of other specified B group vitamins: Secondary | ICD-10-CM | POA: Diagnosis not present

## 2022-01-09 DIAGNOSIS — I10 Essential (primary) hypertension: Secondary | ICD-10-CM

## 2022-01-09 DIAGNOSIS — F411 Generalized anxiety disorder: Secondary | ICD-10-CM

## 2022-01-09 DIAGNOSIS — D509 Iron deficiency anemia, unspecified: Secondary | ICD-10-CM | POA: Diagnosis not present

## 2022-01-09 DIAGNOSIS — G2581 Restless legs syndrome: Secondary | ICD-10-CM | POA: Insufficient documentation

## 2022-01-09 DIAGNOSIS — K5900 Constipation, unspecified: Secondary | ICD-10-CM

## 2022-01-09 DIAGNOSIS — F331 Major depressive disorder, recurrent, moderate: Secondary | ICD-10-CM

## 2022-01-09 DIAGNOSIS — N951 Menopausal and female climacteric states: Secondary | ICD-10-CM

## 2022-01-18 DIAGNOSIS — M5416 Radiculopathy, lumbar region: Secondary | ICD-10-CM | POA: Diagnosis not present

## 2022-01-25 ENCOUNTER — Ambulatory Visit (INDEPENDENT_AMBULATORY_CARE_PROVIDER_SITE_OTHER): Payer: Medicare Other | Admitting: Family

## 2022-01-25 ENCOUNTER — Telehealth: Payer: Self-pay | Admitting: Family

## 2022-01-25 ENCOUNTER — Encounter: Payer: Self-pay | Admitting: Family

## 2022-01-25 VITALS — BP 139/80 | HR 66 | Temp 97.3°F | Ht 61.42 in | Wt 160.8 lb

## 2022-01-25 DIAGNOSIS — E559 Vitamin D deficiency, unspecified: Secondary | ICD-10-CM

## 2022-01-25 DIAGNOSIS — F411 Generalized anxiety disorder: Secondary | ICD-10-CM

## 2022-01-25 DIAGNOSIS — I10 Essential (primary) hypertension: Secondary | ICD-10-CM | POA: Diagnosis not present

## 2022-01-25 DIAGNOSIS — G8929 Other chronic pain: Secondary | ICD-10-CM

## 2022-01-25 DIAGNOSIS — F331 Major depressive disorder, recurrent, moderate: Secondary | ICD-10-CM

## 2022-01-25 DIAGNOSIS — E785 Hyperlipidemia, unspecified: Secondary | ICD-10-CM

## 2022-01-25 DIAGNOSIS — G2581 Restless legs syndrome: Secondary | ICD-10-CM | POA: Diagnosis not present

## 2022-01-25 DIAGNOSIS — M5416 Radiculopathy, lumbar region: Secondary | ICD-10-CM | POA: Diagnosis not present

## 2022-01-25 DIAGNOSIS — K5900 Constipation, unspecified: Secondary | ICD-10-CM

## 2022-01-25 DIAGNOSIS — E039 Hypothyroidism, unspecified: Secondary | ICD-10-CM | POA: Diagnosis not present

## 2022-01-25 DIAGNOSIS — K219 Gastro-esophageal reflux disease without esophagitis: Secondary | ICD-10-CM | POA: Diagnosis not present

## 2022-01-25 DIAGNOSIS — E611 Iron deficiency: Secondary | ICD-10-CM | POA: Diagnosis not present

## 2022-01-25 DIAGNOSIS — M5412 Radiculopathy, cervical region: Secondary | ICD-10-CM | POA: Diagnosis not present

## 2022-01-25 DIAGNOSIS — M545 Low back pain, unspecified: Secondary | ICD-10-CM

## 2022-01-25 NOTE — Telephone Encounter (Signed)
Patient said she found out that her insurance will not cover Wegovy. Is there anything else that she can try? Please call back and let her know.

## 2022-01-25 NOTE — Patient Instructions (Signed)

## 2022-01-25 NOTE — Telephone Encounter (Signed)
Sorry, there are no other medications that are affordable that she would qualify for. I recommend Weight Watches and exercises.

## 2022-01-25 NOTE — Telephone Encounter (Signed)
Pt aware of provider feedback and voiced understanding. 

## 2022-01-25 NOTE — Progress Notes (Signed)
Subjective:    Patient ID: Brittany Lynch, female    DOB: [birth year], 61 y.o.   MRN: [id number]  Chief Complaint  Patient presents with   Medication Refill   Pt presents to the office today for chronic follow up.   She is followed by Neurosurgeon and pain management every month and has hx of lumbar laminectomy and hx of cervical spinal fusion. She is followed by Hematologists every 6 months for iron deficiency and thrombocytosis.    She reports gaining 20 lbs over the last few months.  Hypertension This is a chronic problem. The current episode started more than 1 year ago. The problem has been resolved since onset. The problem is controlled. Associated symptoms include anxiety and malaise/fatigue. Pertinent negatives include no peripheral edema or shortness of breath. Risk factors for coronary artery disease include dyslipidemia and sedentary lifestyle. The current treatment provides moderate improvement. There is no history of heart failure. Identifiable causes of hypertension include a thyroid problem.  Gastroesophageal Reflux She complains of belching and heartburn. She reports no abdominal pain. This is a chronic problem. The current episode started more than 1 year ago. The problem occurs occasionally. The symptoms are aggravated by certain foods. Associated symptoms include fatigue. She has tried a PPI for the symptoms. The treatment provided moderate relief.  Thyroid Problem Presents for follow-up visit. Symptoms include anxiety, constipation, dry skin and fatigue. The symptoms have been stable. Her past medical history is significant for hyperlipidemia. There is no history of heart failure.  Back Pain This is a chronic problem. The current episode started more than 1 year ago. The problem occurs intermittently. The pain is at a severity of 3/10. The pain is moderate. Pertinent negatives include no abdominal pain. She has tried analgesics for the symptoms. The treatment provided  moderate relief.  Anemia Presents for follow-up visit. Symptoms include malaise/fatigue. There has been no abdominal pain. There is no history of heart failure.  Hyperlipidemia This is a chronic problem. The current episode started more than 1 year ago. Exacerbating diseases include obesity. Pertinent negatives include no shortness of breath. Current antihyperlipidemic treatment includes statins. The current treatment provides moderate improvement of lipids. Risk factors for coronary artery disease include dyslipidemia and a sedentary lifestyle.  Anxiety Presents for follow-up visit. Symptoms include excessive worry, irritability and nervous/anxious behavior. Patient reports no shortness of breath. Symptoms occur occasionally. The severity of symptoms is mild.   Her past medical history is significant for anemia.  Depression        This is a chronic problem.  The current episode started more than 1 year ago.   The onset quality is gradual.   The problem occurs intermittently.  Associated symptoms include fatigue.  Associated symptoms include no helplessness, no hopelessness and not sad.  Past treatments include SNRIs - Serotonin and norepinephrine reuptake inhibitors.  Past medical history includes thyroid problem and anxiety.   Constipation This is a chronic problem. The current episode started more than 1 year ago. Her stool frequency is 2 to 3 times per week. Associated symptoms include back pain. Pertinent negatives include no abdominal pain. She has tried diet changes and laxatives for the symptoms. The treatment provided moderate relief.      Review of Systems  Constitutional:  Positive for fatigue, irritability and malaise/fatigue.  Respiratory:  Negative for shortness of breath.   Gastrointestinal:  Positive for constipation and heartburn. Negative for abdominal pain.  Musculoskeletal:  Positive for back pain.  Psychiatric/Behavioral:  Positive for depression. The patient is  nervous/anxious.   All other systems reviewed and are negative.      Objective:   Physical Exam Vitals reviewed.  Constitutional:      General: She is not in acute distress.    Appearance: She is well-developed. She is obese.  HENT:     Head: Normocephalic and atraumatic.     Right Ear: Tympanic membrane normal.     Left Ear: Tympanic membrane normal.  Eyes:     Pupils: Pupils are equal, round, and reactive to light.  Neck:     Thyroid: No thyromegaly.  Cardiovascular:     Rate and Rhythm: Normal rate and regular rhythm.     Heart sounds: Normal heart sounds. No murmur heard. Pulmonary:     Effort: Pulmonary effort is normal. No respiratory distress.     Breath sounds: Normal breath sounds. No wheezing.  Abdominal:     General: Bowel sounds are normal. There is no distension.     Palpations: Abdomen is soft.     Tenderness: There is no abdominal tenderness.  Musculoskeletal:        General: No tenderness. Normal range of motion.     Cervical back: Normal range of motion and neck supple.  Skin:    General: Skin is warm and dry.  Neurological:     Mental Status: She is alert and oriented to person, place, and time.     Cranial Nerves: No cranial nerve deficit.     Deep Tendon Reflexes: Reflexes are normal and symmetric.  Psychiatric:        Behavior: Behavior normal.        Thought Content: Thought content normal.        Judgment: Judgment normal.       BP 139/80   Pulse 66   Temp (!) 97.3 F (36.3 C)   Ht 5' 1.42" (1.56 m)   Wt 160 lb 12.8 oz (72.9 kg)   SpO2 99%   BMI 29.97 kg/m      Assessment & Plan:  NAJMO PARDUE comes in today with chief complaint of Medication Refill   Diagnosis and orders addressed:  1. Primary hypertension  2. Gastroesophageal reflux disease, unspecified whether esophagitis present  3. Hypothyroidism, unspecified type  4. Hyperlipidemia, unspecified hyperlipidemia type  5. Moderate episode of recurrent major depressive  disorder ([place])  6. Iron deficiency  7. RLS (restless legs syndrome)  8. Constipation, unspecified constipation type  9. Lumbar radiculopathy  10. Cervical radiculopathy  11. GAD (generalized anxiety disorder)  12. Chronic low back pain, unspecified back pain laterality, unspecified whether sciatica present  13. Vitamin D deficiency   Labs reviewed, would like to wait on [place] Maintenance reviewed Diet and exercise encouraged  Follow up plan: 4 months    [name], FNP

## 2022-02-22 ENCOUNTER — Telehealth: Payer: Medicare Other | Admitting: Physician Assistant

## 2022-02-22 DIAGNOSIS — R3989 Other symptoms and signs involving the genitourinary system: Secondary | ICD-10-CM

## 2022-02-22 MED ORDER — CEPHALEXIN 500 MG PO CAPS: 500.0000 mg | ORAL_CAPSULE | Freq: Two times a day (BID) | ORAL | 0 refills | Status: AC

## 2022-02-22 NOTE — Progress Notes (Signed)
I have spent 5 minutes in review of e-visit questionnaire, review and updating patient chart, medical decision making and response to patient.   Lilyonna Steidle Cody Mckenzie Toruno, PA-C    

## 2022-02-22 NOTE — Progress Notes (Signed)

## 2022-03-07 DIAGNOSIS — M5416 Radiculopathy, lumbar region: Secondary | ICD-10-CM | POA: Diagnosis not present

## 2022-03-07 DIAGNOSIS — Z9889 Other specified postprocedural states: Secondary | ICD-10-CM | POA: Diagnosis not present

## 2022-04-06 ENCOUNTER — Other Ambulatory Visit: Payer: Self-pay | Admitting: Family

## 2022-04-06 DIAGNOSIS — F411 Generalized anxiety disorder: Secondary | ICD-10-CM

## 2022-04-10 DIAGNOSIS — M5416 Radiculopathy, lumbar region: Secondary | ICD-10-CM | POA: Diagnosis not present

## 2022-04-17 ENCOUNTER — Other Ambulatory Visit: Payer: Self-pay | Admitting: Family

## 2022-04-17 DIAGNOSIS — I10 Essential (primary) hypertension: Secondary | ICD-10-CM

## 2022-04-17 DIAGNOSIS — N951 Menopausal and female climacteric states: Secondary | ICD-10-CM

## 2022-04-17 DIAGNOSIS — E785 Hyperlipidemia, unspecified: Secondary | ICD-10-CM

## 2022-05-02 DIAGNOSIS — M5416 Radiculopathy, lumbar region: Secondary | ICD-10-CM | POA: Diagnosis not present

## 2022-05-02 DIAGNOSIS — Z9889 Other specified postprocedural states: Secondary | ICD-10-CM | POA: Diagnosis not present

## 2022-05-04 ENCOUNTER — Other Ambulatory Visit: Payer: Self-pay | Admitting: Family

## 2022-05-04 DIAGNOSIS — F411 Generalized anxiety disorder: Secondary | ICD-10-CM

## 2022-05-15 DIAGNOSIS — H905 Unspecified sensorineural hearing loss: Secondary | ICD-10-CM | POA: Diagnosis not present

## 2022-05-28 ENCOUNTER — Encounter: Payer: Self-pay | Admitting: Family

## 2022-05-28 ENCOUNTER — Ambulatory Visit (INDEPENDENT_AMBULATORY_CARE_PROVIDER_SITE_OTHER): Payer: Medicare Other | Admitting: Family

## 2022-05-28 VITALS — BP 141/82 | HR 62 | Temp 97.8°F | Ht 61.42 in | Wt 164.0 lb

## 2022-05-28 DIAGNOSIS — E559 Vitamin D deficiency, unspecified: Secondary | ICD-10-CM

## 2022-05-28 DIAGNOSIS — K5900 Constipation, unspecified: Secondary | ICD-10-CM

## 2022-05-28 DIAGNOSIS — E785 Hyperlipidemia, unspecified: Secondary | ICD-10-CM | POA: Diagnosis not present

## 2022-05-28 DIAGNOSIS — E611 Iron deficiency: Secondary | ICD-10-CM

## 2022-05-28 DIAGNOSIS — Z23 Encounter for immunization: Secondary | ICD-10-CM | POA: Diagnosis not present

## 2022-05-28 DIAGNOSIS — K219 Gastro-esophageal reflux disease without esophagitis: Secondary | ICD-10-CM | POA: Diagnosis not present

## 2022-05-28 DIAGNOSIS — E781 Pure hyperglyceridemia: Secondary | ICD-10-CM | POA: Diagnosis not present

## 2022-05-28 DIAGNOSIS — E039 Hypothyroidism, unspecified: Secondary | ICD-10-CM

## 2022-05-28 DIAGNOSIS — F411 Generalized anxiety disorder: Secondary | ICD-10-CM

## 2022-05-28 DIAGNOSIS — R5383 Other fatigue: Secondary | ICD-10-CM | POA: Diagnosis not present

## 2022-05-28 DIAGNOSIS — M545 Low back pain, unspecified: Secondary | ICD-10-CM | POA: Diagnosis not present

## 2022-05-28 DIAGNOSIS — I10 Essential (primary) hypertension: Secondary | ICD-10-CM

## 2022-05-28 DIAGNOSIS — G8929 Other chronic pain: Secondary | ICD-10-CM

## 2022-05-28 DIAGNOSIS — F331 Major depressive disorder, recurrent, moderate: Secondary | ICD-10-CM

## 2022-05-28 NOTE — Progress Notes (Signed)
Subjective:    Patient ID: Brittany Lynch, female    DOB: 25-Jun-1961, 61 y.o.   MRN: [id number]  Chief Complaint  Patient presents with   Medical Management of Chronic Issues   Pt presents to the office today for chronic follow up.    She is followed by Neurosurgeon and pain management every month and has hx of lumbar laminectomy and hx of cervical spinal fusion. She is followed by Hematologists every 6 months for iron deficiency and thrombocytosis.   Hypertension This is a chronic problem. The current episode started more than 1 year ago. The problem has been resolved since onset. The problem is controlled. Associated symptoms include anxiety, malaise/fatigue, palpitations and shortness of breath. Pertinent negatives include no peripheral edema. Risk factors for coronary artery disease include dyslipidemia, diabetes mellitus, obesity and sedentary lifestyle. The current treatment provides moderate improvement. There is no history of CVA or heart failure. Identifiable causes of hypertension include a thyroid problem.  Gastroesophageal Reflux She complains of belching and heartburn. She reports no hoarse voice or no nausea. This is a chronic problem. The current episode started more than 1 year ago. The problem occurs occasionally. Associated symptoms include fatigue. Risk factors include obesity. She has tried a PPI for the symptoms. The treatment provided moderate relief.  Thyroid Problem Presents for follow-up visit. Symptoms include anxiety, constipation, depressed mood, dry skin, fatigue and palpitations. Patient reports no diaphoresis or hoarse voice. The symptoms have been stable. There is no history of heart failure.  Back Pain This is a chronic problem. The current episode started more than 1 year ago. The problem occurs intermittently. The problem has been waxing and waning since onset. The pain is present in the lumbar spine. The quality of the pain is described as aching. The pain  is at a severity of 4/10. The pain is moderate. Associated symptoms include leg pain, tingling and weakness. Pertinent negatives include no fever or paresis. She has tried analgesics and home exercises for the symptoms. The treatment provided moderate relief.  Anemia Presents for follow-up visit. Symptoms include malaise/fatigue and palpitations. There has been no fever. There is no history of heart failure.  Anxiety Presents for follow-up visit. Symptoms include depressed mood, irritability, nervous/anxious behavior, obsessions, palpitations, restlessness and shortness of breath. Patient reports no nausea. Symptoms occur most days. The severity of symptoms is moderate.   Her past medical history is significant for anemia.  Depression        This is a chronic problem.  The current episode started more than 1 year ago.   The problem occurs intermittently.  Associated symptoms include fatigue, restlessness and sad.  Associated symptoms include no helplessness and no hopelessness.  Past medical history includes thyroid problem and anxiety.   Constipation This is a chronic problem. The current episode started more than 1 year ago. The problem has been waxing and waning since onset. Associated symptoms include back pain. Pertinent negatives include no fever, nausea or vomiting. Risk factors include obesity. She has tried laxatives for the symptoms. The treatment provided moderate relief.      Review of Systems  Constitutional:  Positive for fatigue, irritability and malaise/fatigue. Negative for diaphoresis and fever.  HENT:  Negative for hoarse voice.   Respiratory:  Positive for shortness of breath.   Cardiovascular:  Positive for palpitations.  Gastrointestinal:  Positive for constipation and heartburn. Negative for nausea and vomiting.  Musculoskeletal:  Positive for back pain.  Neurological:  Positive for tingling and  weakness.  Psychiatric/Behavioral:  Positive for depression. The patient is  nervous/anxious.   All other systems reviewed and are negative.      Objective:   Physical Exam Vitals reviewed.  Constitutional:      General: She is not in acute distress.    Appearance: She is well-developed. She is obese.  HENT:     Head: Normocephalic and atraumatic.     Right Ear: Tympanic membrane normal.     Left Ear: Tympanic membrane normal.  Eyes:     Pupils: Pupils are equal, round, and reactive to light.  Neck:     Thyroid: No thyromegaly.  Cardiovascular:     Rate and Rhythm: Normal rate and regular rhythm.     Heart sounds: Normal heart sounds. No murmur heard. Pulmonary:     Effort: Pulmonary effort is normal. No respiratory distress.     Breath sounds: Normal breath sounds. No wheezing.  Abdominal:     General: Bowel sounds are normal. There is no distension.     Palpations: Abdomen is soft.     Tenderness: There is no abdominal tenderness.  Musculoskeletal:        General: No tenderness. Normal range of motion.     Cervical back: Normal range of motion and neck supple.  Skin:    General: Skin is warm and dry.  Neurological:     Mental Status: She is alert and oriented to person, place, and time.     Cranial Nerves: No cranial nerve deficit.     Deep Tendon Reflexes: Reflexes are normal and symmetric.  Psychiatric:        Behavior: Behavior normal.        Thought Content: Thought content normal.        Judgment: Judgment normal.          BP (!) 145/77   Pulse 62   Temp 97.8 F (36.6 C) (Temporal)   Ht 5' 1.42" (1.56 m)   Wt 164 lb (74.4 kg)   BMI 30.57 kg/m   Assessment & Plan:  [name] comes in today with chief complaint of Medical Management of Chronic Issues   Diagnosis and orders addressed:  1. Need for immunization against influenza  - Flu Vaccine QUAD 50moIM (Fluarix, Fluzone & Alfiuria Quad PF) - CMP14+EGFR  2. Primary hypertension - CMP14+EGFR  3. Hyperlipidemia, unspecified hyperlipidemia type - CMP14+EGFR  4.  GAD (generalized anxiety disorder) - CMP14+EGFR  5. Moderate episode of recurrent major depressive disorder (HCC) - CMP14+EGFR  6. Hypothyroidism, unspecified type - CMP14+EGFR  7. Chronic low back pain, unspecified back pain laterality, unspecified whether sciatica present  - CMP14+EGFR  8. Vitamin D deficiency - CMP14+EGFR  9. Hypertriglyceridemia - CMP14+EGFR  10. Gastroesophageal reflux disease, unspecified whether esophagitis present - CMP14+EGFR  11. Constipation, unspecified constipation type  - CMP14+EGFR  12. Iron deficiency - CMP14+EGFR   Labs pending Health Maintenance reviewed Diet and exercise encouraged  Follow up plan: 4 months    CEvelina Dun FNP

## 2022-05-28 NOTE — Patient Instructions (Signed)

## 2022-05-29 LAB — CMP14+EGFR
ALT: 23 IU/L (ref 0–32)
AST: 23 IU/L (ref 0–40)
Albumin/Globulin Ratio: 2 (ref 1.2–2.2)
Albumin: 4.7 g/dL (ref 3.9–4.9)
Alkaline Phosphatase: 44 IU/L (ref 44–121)
BUN/Creatinine Ratio: 13 (ref 12–28)
BUN: 12 mg/dL (ref 8–27)
Bilirubin Total: 0.4 mg/dL (ref 0.0–1.2)
CO2: 25 mmol/L (ref 20–29)
Calcium: 9.7 mg/dL (ref 8.7–10.3)
Chloride: 104 mmol/L (ref 96–106)
Creatinine, Ser: 0.91 mg/dL (ref 0.57–1.00)
Globulin, Total: 2.4 g/dL (ref 1.5–4.5)
Glucose: 81 mg/dL (ref 70–99)
Potassium: 4.5 mmol/L (ref 3.5–5.2)
Sodium: 142 mmol/L (ref 134–144)
Total Protein: 7.1 g/dL (ref 6.0–8.5)
eGFR: 71 mL/min/{1.73_m2} (ref >59)

## 2022-05-31 ENCOUNTER — Telehealth: Payer: Self-pay | Admitting: Family Medicine

## 2022-05-31 NOTE — Telephone Encounter (Signed)
Ok to add Vit B 12 for fatigue.

## 2022-05-31 NOTE — Telephone Encounter (Signed)
Patient wants to know if you will add on a B12 to labs

## 2022-05-31 NOTE — Telephone Encounter (Signed)
Patient aware and verbalized understanding. °

## 2022-06-02 LAB — SPECIMEN STATUS REPORT

## 2022-06-02 LAB — B12 AND FOLATE PANEL
Folate: 17 ng/mL (ref >3.0)
Vitamin B-12: 373 pg/mL (ref 232–1245)

## 2022-06-05 ENCOUNTER — Other Ambulatory Visit: Payer: Self-pay | Admitting: Family

## 2022-06-05 DIAGNOSIS — F411 Generalized anxiety disorder: Secondary | ICD-10-CM

## 2022-07-02 ENCOUNTER — Other Ambulatory Visit: Payer: Self-pay | Admitting: Family

## 2022-07-02 DIAGNOSIS — K5900 Constipation, unspecified: Secondary | ICD-10-CM

## 2022-07-02 DIAGNOSIS — F411 Generalized anxiety disorder: Secondary | ICD-10-CM

## 2022-07-02 DIAGNOSIS — F331 Major depressive disorder, recurrent, moderate: Secondary | ICD-10-CM

## 2022-07-02 DIAGNOSIS — G2581 Restless legs syndrome: Secondary | ICD-10-CM

## 2022-07-06 ENCOUNTER — Other Ambulatory Visit: Payer: Self-pay | Admitting: Family

## 2022-07-06 DIAGNOSIS — F411 Generalized anxiety disorder: Secondary | ICD-10-CM

## 2022-07-18 DIAGNOSIS — M5416 Radiculopathy, lumbar region: Secondary | ICD-10-CM | POA: Diagnosis not present

## 2022-07-25 ENCOUNTER — Other Ambulatory Visit: Payer: 59

## 2022-07-25 ENCOUNTER — Encounter (HOSPITAL_COMMUNITY): Payer: Self-pay | Admitting: Hematology

## 2022-07-25 DIAGNOSIS — E538 Deficiency of other specified B group vitamins: Secondary | ICD-10-CM | POA: Diagnosis not present

## 2022-07-25 DIAGNOSIS — D509 Iron deficiency anemia, unspecified: Secondary | ICD-10-CM

## 2022-07-26 LAB — FERRITIN: Ferritin: 139 ng/mL (ref 15–150)

## 2022-07-26 LAB — CBC WITH DIFFERENTIAL/PLATELET
Basophils Absolute: 0.1 10*3/uL (ref 0.0–0.2)
Basos: 1 %
EOS (ABSOLUTE): 0.1 10*3/uL (ref 0.0–0.4)
Eos: 1 %
Hematocrit: 42.4 % (ref 34.0–46.6)
Hemoglobin: 13.7 g/dL (ref 11.1–15.9)
Immature Grans (Abs): 0 10*3/uL (ref 0.0–0.1)
Immature Granulocytes: 0 %
Lymphocytes Absolute: 2.5 10*3/uL (ref 0.7–3.1)
Lymphs: 38 %
MCH: 28.4 pg (ref 26.6–33.0)
MCHC: 32.3 g/dL (ref 31.5–35.7)
MCV: 88 fL (ref 79–97)
Monocytes Absolute: 0.5 10*3/uL (ref 0.1–0.9)
Monocytes: 8 %
Neutrophils Absolute: 3.4 10*3/uL (ref 1.4–7.0)
Neutrophils: 52 %
Platelets: 454 10*3/uL — ABNORMAL HIGH (ref 150–450)
RBC: 4.83 x10E6/uL (ref 3.77–5.28)
RDW: 12.6 % (ref 11.7–15.4)
WBC: 6.6 10*3/uL (ref 3.4–10.8)

## 2022-07-26 LAB — IRON AND TIBC
Iron Saturation: 29 % (ref 15–55)
Iron: 129 ug/dL (ref 27–139)
Total Iron Binding Capacity: 449 ug/dL (ref 250–450)
UIBC: 320 ug/dL (ref 118–369)

## 2022-07-26 NOTE — Progress Notes (Signed)
VIRTUAL VISIT via [place]   I connected with Brittany Lynch  on 07/27/22 at  1:28 PM by telephone and verified that I am speaking with the correct person using two identifiers.  Location: Patient: Home Provider: [facility]   I discussed the limitations, risks, security and privacy concerns of performing an evaluation and management service by telephone and the availability of in person appointments. I also discussed with the patient that there may be a patient responsible charge related to this service. The patient expressed understanding and agreed to proceed.  REASON FOR VISIT:  Follow-up for iron deficiency anemia and thrombocytosis   PRIOR THERAPY: Oral iron supplementation   CURRENT THERAPY: IV iron (Feraheme x2 on 05/04/2021 and 05/12/2021)  INTERVAL HISTORY:  [name] is contacted today for follow-up of  iron deficiency anemia and thrombocytosis. She was last seen by Tarri Abernethy, PA-C on 01/09/2022.   At today's visit, she reports feeling fair.  She continues to have chronic diaphoresis and fatigue.  She feels that her fatigue is somewhat worsened due to depression, but denies any thoughts of self-harm. She continues to have ongoing restless leg symptoms.   She denies any current ice pica.  No lightheadedness, dyspnea on exertion, or chest pain. No recent signs of GI bleeding such as bright red blood per rectum or melena.  She denies any vasomotor symptoms, erythromelalgia, or aquagenic pruritus.  No DVT or PE.   She has 25% energy and 85% appetite. She endorses that she is maintaining a stable weight.  REVIEW OF SYSTEMS:   Review of Systems  Constitutional:  Positive for diaphoresis and malaise/fatigue. Negative for chills, fever and weight loss.  Respiratory:  Positive for shortness of breath. Negative for cough.   Cardiovascular:  Positive for palpitations. Negative for chest pain.  Gastrointestinal:  Negative  for abdominal pain, blood in stool, melena, nausea and vomiting.  Genitourinary:  Positive for urgency.  Neurological:  Negative for dizziness and headaches.  Psychiatric/Behavioral:  Positive for depression. The patient is nervous/anxious and has insomnia.      PHYSICAL EXAM: (per limitations of virtual telephone visit)  The patient is alert and oriented x 3, exhibiting adequate mentation, good mood, and ability to speak in full sentences and execute sound judgement.  ASSESSMENT & PLAN:  1.  Thrombocytosis - Referred to hematology for initial consult with Dr. Chryl Heck on 12/09/2020. - No previous history of blood clots, no current signs or symptoms of DVT or PE   - Review of past labs shows intermittent thrombocytosis since January 2020, with maximum platelets 536 - MPN work-up negative for JAK2, CALR, or MPL mutation.  (BCR/ABL not tested) - Other work-up revealed iron deficiency with ferritin 12 and iron saturation 9% - IV iron repletion with Feraheme in October 2022 - Platelets returned to normal after iron repletion, with platelets 366 (06/27/2021) - Most recent labs (07/25/2022): Platelets 454 - Differential diagnosis favors reactive thrombocytosis secondary to iron deficiency. - PLAN: No indication for further testing/MPN work-up at this time (i.e. BCR/ABL testing). - We will check CBC/iron panel again in 1 year   2.  Iron deficiency state without anemia - Initial work-up (12/09/2020): Iron deficiency with ferritin 12, iron saturation 9% - Patient was started on oral iron supplementation (ferrous sulfate) at her last visit (01/06/2021), which caused some constipation - Repeat iron panel (04/25/2021): Persistent iron deficiency with ferritin 22, iron saturation 13%, elevated TIBC 572 - Patient received  Feraheme x2 on 05/04/2021 and 05/12/2021 (steroid premedication given due to multiple medication allergies) - Patient denies any signs or symptoms of blood loss such as epistaxis,  hematemesis, hematochezia, or melena. - She has ongoing fatigue and restless legs despite adequate iron stores - Most recent labs (07/25/2022): Hgb 13.7/MCV 88, ferritin 139, iron saturation 29%. - Folate, B12, methylmalonic acid within normal limits when checked in December 2022. - Suspect malabsorption, possibly due to daily PPI use  - PLAN: No indication for IV iron at this time.  Repeat CBC/iron panel and RTC in 1 year   3.  Borderline B12 deficiency - B12 checked on 06/27/2021 was borderline at 293, with normal methylmalonic acid - She is taking OTC vitamin B12 supplement - Improved vitamin B12 (07/25/2022) 396.  (MMA pending) - PLAN: Continue over-the-counter vitamin B 12 supplement.  We will recheck at follow-up visit.   PLAN SUMMARY: >> Labs in 1 year (CBC/D, ferritin, iron/TIBC, B12, MMA, CMP) >> Office visit in 1 year, 1 week after labs     I discussed the assessment and treatment plan with the patient. The patient was provided an opportunity to ask questions and all were answered. The patient agreed with the plan and demonstrated an understanding of the instructions.   The patient was advised to call back or seek an in-person evaluation if the symptoms worsen or if the condition fails to improve as anticipated.  I provided 22 minutes of non-face-to-face time during this encounter.  Harriett Rush, PA-C 07/27/2022 2:01 PM

## 2022-07-27 ENCOUNTER — Inpatient Hospital Stay: Payer: Medicare Other | Attending: Physician Assistant | Admitting: Physician Assistant

## 2022-07-27 DIAGNOSIS — E538 Deficiency of other specified B group vitamins: Secondary | ICD-10-CM | POA: Diagnosis not present

## 2022-07-27 DIAGNOSIS — D509 Iron deficiency anemia, unspecified: Secondary | ICD-10-CM | POA: Diagnosis not present

## 2022-07-27 DIAGNOSIS — D75839 Thrombocytosis, unspecified: Secondary | ICD-10-CM

## 2022-07-30 LAB — VITAMIN B12: Vitamin B-12: 396 pg/mL (ref 232–1245)

## 2022-07-30 LAB — METHYLMALONIC ACID, SERUM: Methylmalonic Acid: 263 nmol/L (ref 0–378)

## 2022-07-31 ENCOUNTER — Other Ambulatory Visit: Payer: Self-pay | Admitting: Family

## 2022-07-31 DIAGNOSIS — N951 Menopausal and female climacteric states: Secondary | ICD-10-CM

## 2022-07-31 DIAGNOSIS — I10 Essential (primary) hypertension: Secondary | ICD-10-CM

## 2022-07-31 DIAGNOSIS — E785 Hyperlipidemia, unspecified: Secondary | ICD-10-CM

## 2022-08-29 DIAGNOSIS — M542 Cervicalgia: Secondary | ICD-10-CM | POA: Diagnosis not present

## 2022-08-29 DIAGNOSIS — M5416 Radiculopathy, lumbar region: Secondary | ICD-10-CM | POA: Diagnosis not present

## 2022-09-02 NOTE — Progress Notes (Unsigned)
Subjective:   Brittany Lynch is a 62 y.o. female who presents for Medicare Annual (Subsequent) preventive examination.  Review of Systems    ***       Objective:    There were no vitals filed for this visit. There is no height or weight on file to calculate BMI.     07/27/2022   11:14 AM 01/09/2022    2:57 PM 09/01/2021    9:10 AM 07/04/2021    8:48 AM 05/04/2021    9:06 AM 04/28/2021   10:44 AM 02/06/2021    3:12 PM  Advanced Directives  Does Patient Have a Medical Advance Directive? No No No No No No No  Would patient like information on creating a medical advance directive? No - Patient declined No - Patient declined No - Patient declined No - Patient declined No - Patient declined No - Patient declined No - Patient declined  Pre-existing out of facility DNR order (yellow form or pink MOST form)       Yellow form placed in chart (order not valid for inpatient use)    Current Medications (verified) Outpatient Encounter Medications as of 09/03/2022  Medication Sig   atorvastatin (LIPITOR) 20 MG tablet Take 1 tablet (20 mg total) by mouth daily.   buPROPion (WELLBUTRIN XL) 300 MG 24 hr tablet Take 1 tablet (300 mg total) by mouth daily.   chlorhexidine (PERIDEX) 0.12 % solution 15 mLs 2 (two) times daily.   desvenlafaxine (PRISTIQ) 100 MG 24 hr tablet Take 1 tablet (100 mg total) by mouth daily.   diazepam (VALIUM) 5 MG tablet SMARTSIG:1 Tablet(s) By Mouth   diphenhydrAMINE (BENADRYL) 25 mg capsule Take 25 mg by mouth every 6 (six) hours as needed for itching (after neck injections).   estradiol (ESTRACE) 2 MG tablet TAKE ONE TABLET BY MOUTH DAILY   fenofibrate (TRICOR) 145 MG tablet TAKE ONE TABLET BY MOUTH DAILY   ibuprofen (ADVIL) 800 MG tablet Take 800 mg by mouth every 8 (eight) hours as needed.   levothyroxine (SYNTHROID) 100 MCG tablet Take 1 tablet (100 mcg total) by mouth daily.   LINZESS 145 MCG CAPS capsule Take 1 capsule (145 mcg total) by mouth daily before  breakfast.   metoprolol tartrate (LOPRESSOR) 50 MG tablet TAKE ONE TABLET BY MOUTH TWICE DAILY   omeprazole (PRILOSEC) 40 MG capsule TAKE ONE CAPSULE BY MOUTH EVERY DAY   Oxycodone HCl 10 MG TABS Take 10 mg by mouth every 6 (six) hours as needed for severe pain.   pramipexole (MIRAPEX) 0.5 MG tablet TAKE TWO TABLETS BY MOUTH EVERY EVENING   No facility-administered encounter medications on file as of 09/03/2022.    Allergies (verified) Chocolate, Iodinated contrast media, Methotrexate derivatives, Lisinopril, and Tape   History: Past Medical History:  Diagnosis Date   Anemia    Anxiety    Arthritis    spine/neck   Depression    GERD (gastroesophageal reflux disease)    Headache    Hyperlipidemia    Hypertension    Hypothyroidism    Iron deficiency 04/28/2021   Thyroid disease    Past Surgical History:  Procedure Laterality Date   ABDOMINAL HYSTERECTOMY     ANTERIOR CERVICAL DECOMP/DISCECTOMY FUSION N/A 02/08/2021   Procedure: CERVICAL FOUR-FIVE ANTERIOR CERVICAL DECOMPRESSION/DISCECTOMY FUSION;  Surgeon: Eustace Moore, MD;  Location: [facility];  Service: Neurosurgery;  Laterality: N/A;  3C   BACK SURGERY     CATARACT EXTRACTION Bilateral    EYE SURGERY  LUMBAR LAMINECTOMY/DECOMPRESSION MICRODISCECTOMY Bilateral 03/06/2018   Procedure: Laminectomy and Foraminotomy - Lumbar Two-Three, Three-Four bilateral;  Surgeon: Eustace Moore, MD;  Location: [facility];  Service: Neurosurgery;  Laterality: Bilateral;  Laminectomy and Foraminotomy - Lumbar Two-Three, Three-Four bilateral   NECK SURGERY     Right hand and wrist surgery Right    Laceration    SPINE SURGERY     Family History  Problem Relation Age of Onset   Mental illness Mother    Alcohol abuse Father    Diabetes Father    Heart disease Father    Social History   Socioeconomic History   Marital status: Legally Separated    Spouse name: Not on file   Number of children: 3   Years of education: Not on file   Highest  education level: 9th grade  Occupational History   Occupation: Retired  Tobacco Use   Smoking status: Former    Types: Cigarettes    Quit date: 07/16/2001    Years since quitting: 21.1   Smokeless tobacco: Never  Vaping Use   Vaping Use: Never used  Substance and Sexual Activity   Alcohol use: No    Comment: hx alcoholism. none since age 57   Drug use: Yes    Comment: once every 2 weeks [facility])   Sexual activity: Yes  Other Topics Concern   Not on file  Social History Narrative   Lives in apartment basement of daughters home    Social Determinants of Health   Financial Resource Strain: Low Risk  (09/01/2021)   Overall Financial Resource Strain (CARDIA)    Difficulty of Paying Living Expenses: Not very hard  Food Insecurity: Food Insecurity Present (09/01/2021)   Hunger Vital Sign    Worried About Running Out of Food in the Last Year: Sometimes true    Ran Out of Food in the Last Year: Never true  Transportation Needs: No Transportation Needs (09/01/2021)   PRAPARE - Hydrologist (Medical): No    Lack of Transportation (Non-Medical): No  Physical Activity: Insufficiently Active (09/01/2021)   Exercise Vital Sign    Days of Exercise per Week: 7 days    Minutes of Exercise per Session: 10 min  Stress: No Stress Concern Present (09/01/2021)   [place]    Feeling of Stress : Only a little  Social Connections: Moderately Integrated (09/01/2021)   Social Connection and Isolation Panel [NHANES]    Frequency of Communication with Friends and Family: More than three times a week    Frequency of Social Gatherings with Friends and Family: Never    Attends Religious Services: More than 4 times per year    Active Member of [employer] or Organizations: Yes    Attends Archivist Meetings: 1 to 4 times per year    Marital Status: Separated    Tobacco Counseling Counseling given: Not  Answered   Clinical Intake:                 Diabetic?No          Activities of Daily Living     No data to display          Patient Care Team: Sharion Balloon, FNP as PCP - General (Family Medicine) Gala Romney, Cristopher Estimable, MD as Consulting Physician (Gastroenterology) Konrad Saha as Physician Assistant (Oncology) Reece Agar, MD as Consulting Physician (Pain Medicine)  Indicate any recent Medical  Services you may have received from other than [facility] providers in the past year (date may be approximate).     Assessment:   This is a routine wellness examination for Estrella.  Hearing/Vision screen No results found.  Dietary issues and exercise activities discussed:     Goals Addressed   None    Depression Screen    05/28/2022    3:55 PM 01/25/2022   11:37 AM 09/26/2021    3:42 PM 09/01/2021    9:04 AM 11/17/2020   10:15 AM 08/31/2020    9:48 AM 11/18/2019    3:00 PM  PHQ 2/9 Scores  PHQ - 2 Score 3 5 3 $ 0 0 1 0  PHQ- 9 Score 15 17 14        $ Fall Risk    01/25/2022   11:37 AM 09/01/2021    9:01 AM 08/28/2021    8:40 AM 01/19/2021   10:58 AM 11/17/2020   10:11 AM  Fall Risk   Falls in the past year? 0 0 0 0 1  Number falls in past yr:  0 0  0  Injury with Fall?  0 0  1  Risk for fall due to :  No Fall Risks   History of fall(s)  Follow up  Falls prevention discussed   Education provided    [place]:  Any stairs in or around the home? {YES/NO:21197} If so, are there any without handrails? {YES/NO:21197} Home free of loose throw rugs in walkways, pet beds, electrical cords, etc? {YES/NO:21197} Adequate lighting in your home to reduce risk of falls? {YES/NO:21197}  ASSISTIVE DEVICES UTILIZED TO PREVENT FALLS:  Life alert? {YES/NO:21197} Use of a cane, walker or w/c? {YES/NO:21197} Grab bars in the bathroom? {YES/NO:21197} Shower chair or bench in shower? {YES/NO:21197} Elevated toilet seat or a  handicapped toilet? {YES/NO:21197}  TIMED UP AND GO:  Was the test performed? No . Telephonic visit   Cognitive Function:    08/25/2018    2:58 PM  MMSE - Mini Mental State Exam  Orientation to time 5  Orientation to Place 5  Registration 3  Attention/ Calculation 5  Recall 2  Language- name 2 objects 2  Language- repeat 1  Language- follow 3 step command 3  Language- read & follow direction 1  Write a sentence 1  Copy design 1  Total score 29        08/31/2020    9:51 AM 08/27/2019    9:36 AM  6CIT Screen  What Year? 0 points 0 points  What month? 0 points 0 points  What time? 0 points 0 points  Count back from 20 0 points 0 points  Months in reverse 0 points 0 points  Repeat phrase 0 points 0 points  Total Score 0 points 0 points    Immunizations Immunization History  Administered Date(s) Administered   H1N1 06/01/2008   Influenza Split 04/26/2013, 04/19/2015   Influenza,inj,Quad PF,6+ Mos 05/06/2017, 08/14/2018, 07/26/2020, 04/28/2021, 05/28/2022   Influenza-Unspecified 07/17/2011   Moderna SARS-COV2 Booster Vaccination 09/14/2020   Moderna Sars-Covid-2 Vaccination 11/08/2019, 12/06/2019   Tdap 09/02/2012   Zoster Recombinat (Shingrix) 07/24/2021, 09/26/2021    {TDAP status:2101805}  Flu Vaccine status: Up to date  Pneumococcal vaccine status: Up to date  Covid-19 vaccine status: Information provided on how to obtain vaccines.   Qualifies for Shingles Vaccine? Yes   Zostavax completed No   Shingrix Completed?: Yes  Screening Tests Health Maintenance  Topic Date Due  COVID-19 Vaccine (3 - Moderna risk series) 10/12/2020   Medicare Annual Wellness (AWV)  09/01/2022   DTaP/Tdap/Td (2 - Td or Tdap) 09/02/2022   MAMMOGRAM  01/24/2023   COLONOSCOPY (Pts 45-80yr Insurance coverage will need to be confirmed)  11/07/2027   INFLUENZA VACCINE  Completed   Hepatitis C Screening  Completed   HIV Screening  Completed   Zoster Vaccines- Shingrix   Completed   HPV VACCINES  Aged Out    Health Maintenance  Health Maintenance Due  Topic Date Due   COVID-19 Vaccine (3 - Moderna risk series) 10/12/2020   Medicare Annual Wellness (AWV)  09/01/2022   DTaP/Tdap/Td (2 - Td or Tdap) 09/02/2022    Colorectal cancer screening: Type of screening: Colonoscopy. Completed 11/06/17. Repeat every 10 years  {Mammogram status:21018020}  {Bone Density status:21018021}  Lung Cancer Screening: (Low Dose CT Chest recommended if Age 63-80years, 30 pack-year currently smoking OR have quit w/in 15years.) does not qualify.   Lung Cancer Screening Referral: n/a  Additional Screening:  Hepatitis C Screening: does qualify; Completed 10/17/17  Vision Screening: Recommended annual ophthalmology exams for early detection of glaucoma and other disorders of the eye. Is the patient up to date with their annual eye exam?  {YES/NO:21197} Who is the provider or what is the name of the office in which the patient attends annual eye exams? *** If pt is not established with a provider, would they like to be referred to a provider to establish care? {YES/NO:21197}.   Dental Screening: Recommended annual dental exams for proper oral hygiene  Community Resource Referral / Chronic Care Management: CRR required this visit?  {YES/NO:21197}  CCM required this visit?  {YES/NO:21197}     Plan:     I have personally reviewed and noted the following in the patient's chart:   Medical and social history Use of alcohol, tobacco or illicit drugs  Current medications and supplements including opioid prescriptions. {Opioid [phone]} Functional ability and status Nutritional status Physical activity Advanced directives List of other physicians Hospitalizations, surgeries, and ER visits in previous 12 months Vitals Screenings to include cognitive, depression, and falls Referrals and appointments  In addition, I have reviewed and discussed with  patient certain preventive protocols, quality metrics, and best practice recommendations. A written personalized care plan for preventive services as well as general preventive health recommendations were provided to patient.     SVanetta Mulders [place]  [id number]  Due to this being a virtual visit, the after visit summary with patients personalized plan was offered to patient via mail or my-chart. ***Patient declined at this time./ Patient would like to access on my-chart/ per request, patient was mailed a copy of AVS./ Patient preferred to pick up at office at next visit   Nurse Notes: ***

## 2022-09-02 NOTE — Patient Instructions (Incomplete)
Brittany Lynch , Thank you for taking time to come for your Medicare Wellness Visit. I appreciate your ongoing commitment to your health goals. Please review the following plan we discussed and let me know if I can assist you in the future.   These are the goals we discussed:  Goals      Exercise 3x per week (30 min per time)     Try to exercise for at least 30 minutes, 3 times weekly     Prevent falls     Stay active         This is a list of the screening recommended for you and due dates:  Health Maintenance  Topic Date Due   COVID-19 Vaccine (3 - Moderna risk series) 10/12/2020   Medicare Annual Wellness Visit  09/01/2022   DTaP/Tdap/Td vaccine (2 - Td or Tdap) 09/02/2022   Mammogram  01/24/2023   Colon Cancer Screening  11/07/2027   Flu Shot  Completed   Hepatitis C Screening: USPSTF Recommendation to screen - Ages 18-79 yo.  Completed   HIV Screening  Completed   Zoster (Shingles) Vaccine  Completed   HPV Vaccine  Aged Out    Advanced directives: ***  Conditions/risks identified: Aim for 30 minutes of exercise or brisk walking, 6-8 glasses of water, and 5 servings of fruits and vegetables each day.   Next appointment: Follow up in one year for your annual wellness visit.   Preventive Care 40-64 Years, Female Preventive care refers to lifestyle choices and visits with your health care provider that can promote health and wellness. What does preventive care include? A yearly physical exam. This is also called an annual well check. Dental exams once or twice a year. Routine eye exams. Ask your health care provider how often you should have your eyes checked. Personal lifestyle choices, including: Daily care of your teeth and gums. Regular physical activity. Eating a healthy diet. Avoiding tobacco and drug use. Limiting alcohol use. Practicing safe sex. Taking low-dose aspirin daily starting at age 28. Taking vitamin and mineral supplements as recommended by your  health care provider. What happens during an annual well check? The services and screenings done by your health care provider during your annual well check will depend on your age, overall health, lifestyle risk factors, and family history of disease. Counseling  Your health care provider may ask you questions about your: Alcohol use. Tobacco use. Drug use. Emotional well-being. Home and relationship well-being. Sexual activity. Eating habits. Work and work Statistician. Method of birth control. Menstrual cycle. Pregnancy history. Screening  You may have the following tests or measurements: Height, weight, and BMI. Blood pressure. Lipid and cholesterol levels. These may be checked every 5 years, or more frequently if you are over 26 years old. Skin check. Lung cancer screening. You may have this screening every year starting at age 53 if you have a 30-pack-year history of smoking and currently smoke or have quit within the past 15 years. Fecal occult blood test (FOBT) of the stool. You may have this test every year starting at age 54. Flexible sigmoidoscopy or colonoscopy. You may have a sigmoidoscopy every 5 years or a colonoscopy every 10 years starting at age 20. Hepatitis C blood test. Hepatitis B blood test. Sexually transmitted disease (STD) testing. Diabetes screening. This is done by checking your blood sugar (glucose) after you have not eaten for a while (fasting). You may have this done every 1-3 years. Mammogram. This may be done every  1-2 years. Talk to your health care provider about when you should start having regular mammograms. This may depend on whether you have a family history of breast cancer. BRCA-related cancer screening. This may be done if you have a family history of breast, ovarian, tubal, or peritoneal cancers. Pelvic exam and Pap test. This may be done every 3 years starting at age 50. Starting at age 41, this may be done every 5 years if you have a Pap test  in combination with an HPV test. Bone density scan. This is done to screen for osteoporosis. You may have this scan if you are at high risk for osteoporosis. Discuss your test results, treatment options, and if necessary, the need for more tests with your health care provider. Vaccines  Your health care provider may recommend certain vaccines, such as: Influenza vaccine. This is recommended every year. Tetanus, diphtheria, and acellular pertussis (Tdap, Td) vaccine. You may need a Td booster every 10 years. Zoster vaccine. You may need this after age 56. Pneumococcal 13-valent conjugate (PCV13) vaccine. You may need this if you have certain conditions and were not previously vaccinated. Pneumococcal polysaccharide (PPSV23) vaccine. You may need one or two doses if you smoke cigarettes or if you have certain conditions. Talk to your health care provider about which screenings and vaccines you need and how often you need them. This information is not intended to replace advice given to you by your health care provider. Make sure you discuss any questions you have with your health care provider. Document Released: 07/29/2015 Document Revised: 03/21/2016 Document Reviewed: 05/03/2015 Elsevier Interactive Patient Education  2017 [place] Prevention in the Home Falls can cause injuries. They can happen to people of all ages. There are many things you can do to make your home safe and to help prevent falls. What can I do on the outside of my home? Regularly fix the edges of walkways and driveways and fix any cracks. Remove anything that might make you trip as you walk through a door, such as a raised step or threshold. Trim any bushes or trees on the path to your home. Use bright outdoor lighting. Clear any walking paths of anything that might make someone trip, such as rocks or tools. Regularly check to see if handrails are loose or broken. Make sure that both sides of any steps have  handrails. Any raised decks and porches should have guardrails on the edges. Have any leaves, snow, or ice cleared regularly. Use sand or salt on walking paths during winter. Clean up any spills in your garage right away. This includes oil or grease spills. What can I do in the bathroom? Use night lights. Install grab bars by the toilet and in the tub and shower. Do not use towel bars as grab bars. Use non-skid mats or decals in the tub or shower. If you need to sit down in the shower, use a plastic, non-slip stool. Keep the floor dry. Clean up any water that spills on the floor as soon as it happens. Remove soap buildup in the tub or shower regularly. Attach bath mats securely with double-sided non-slip rug tape. Do not have throw rugs and other things on the floor that can make you trip. What can I do in the bedroom? Use night lights. Make sure that you have a light by your bed that is easy to reach. Do not use any sheets or blankets that are too big for your bed.  They should not hang down onto the floor. Have a firm chair that has side arms. You can use this for support while you get dressed. Do not have throw rugs and other things on the floor that can make you trip. What can I do in the kitchen? Clean up any spills right away. Avoid walking on wet floors. Keep items that you use a lot in easy-to-reach places. If you need to reach something above you, use a strong step stool that has a grab bar. Keep electrical cords out of the way. Do not use floor polish or wax that makes floors slippery. If you must use wax, use non-skid floor wax. Do not have throw rugs and other things on the floor that can make you trip. What can I do with my stairs? Do not leave any items on the stairs. Make sure that there are handrails on both sides of the stairs and use them. Fix handrails that are broken or loose. Make sure that handrails are as long as the stairways. Check any carpeting to make sure  that it is firmly attached to the stairs. Fix any carpet that is loose or worn. Avoid having throw rugs at the top or bottom of the stairs. If you do have throw rugs, attach them to the floor with carpet tape. Make sure that you have a light switch at the top of the stairs and the bottom of the stairs. If you do not have them, ask someone to add them for you. What else can I do to help prevent falls? Wear shoes that: Do not have high heels. Have rubber bottoms. Are comfortable and fit you well. Are closed at the toe. Do not wear sandals. If you use a stepladder: Make sure that it is fully opened. Do not climb a closed stepladder. Make sure that both sides of the stepladder are locked into place. Ask someone to hold it for you, if possible. Clearly mark and make sure that you can see: Any grab bars or handrails. First and last steps. Where the edge of each step is. Use tools that help you move around (mobility aids) if they are needed. These include: Canes. Walkers. Scooters. Crutches. Turn on the lights when you go into a dark area. Replace any light bulbs as soon as they burn out. Set up your furniture so you have a clear path. Avoid moving your furniture around. If any of your floors are uneven, fix them. If there are any pets around you, be aware of where they are. Review your medicines with your doctor. Some medicines can make you feel dizzy. This can increase your chance of falling. Ask your doctor what other things that you can do to help prevent falls. This information is not intended to replace advice given to you by your health care provider. Make sure you discuss any questions you have with your health care provider. Document Released: 04/28/2009 Document Revised: 12/08/2015 Document Reviewed: 08/06/2014 Elsevier Interactive Patient Education  2017 [employer].

## 2022-09-03 ENCOUNTER — Ambulatory Visit (INDEPENDENT_AMBULATORY_CARE_PROVIDER_SITE_OTHER): Payer: 59

## 2022-09-03 VITALS — Ht 61.0 in | Wt 164.0 lb

## 2022-09-03 DIAGNOSIS — Z0000 Encounter for general adult medical examination without abnormal findings: Secondary | ICD-10-CM

## 2022-09-03 DIAGNOSIS — Z1231 Encounter for screening mammogram for malignant neoplasm of breast: Secondary | ICD-10-CM | POA: Diagnosis not present

## 2022-09-05 ENCOUNTER — Encounter (HOSPITAL_COMMUNITY): Payer: Self-pay | Admitting: Hematology

## 2022-09-13 ENCOUNTER — Encounter (HOSPITAL_COMMUNITY): Payer: Self-pay | Admitting: Hematology

## 2022-09-17 ENCOUNTER — Ambulatory Visit
Admission: RE | Admit: 2022-09-17 | Discharge: 2022-09-17 | Disposition: A | Discharge status: Home / Self Care | Payer: 59 | Source: Ambulatory Visit | Attending: Family | Admitting: Family

## 2022-09-17 DIAGNOSIS — Z1231 Encounter for screening mammogram for malignant neoplasm of breast: Secondary | ICD-10-CM | POA: Diagnosis not present

## 2022-09-28 ENCOUNTER — Ambulatory Visit: Payer: 59 | Admitting: Family

## 2022-09-29 ENCOUNTER — Other Ambulatory Visit: Payer: Self-pay | Admitting: Family

## 2022-09-29 DIAGNOSIS — G2581 Restless legs syndrome: Secondary | ICD-10-CM

## 2022-09-29 DIAGNOSIS — F331 Major depressive disorder, recurrent, moderate: Secondary | ICD-10-CM

## 2022-09-29 DIAGNOSIS — F411 Generalized anxiety disorder: Secondary | ICD-10-CM

## 2022-09-29 DIAGNOSIS — K5900 Constipation, unspecified: Secondary | ICD-10-CM

## 2022-10-01 ENCOUNTER — Encounter: Payer: Self-pay | Admitting: Family

## 2022-10-01 NOTE — Telephone Encounter (Signed)
Letter sent.

## 2022-10-01 NOTE — Telephone Encounter (Signed)
Pt scheduled for 10/29/2022.  

## 2022-10-02 ENCOUNTER — Encounter: Payer: Self-pay | Admitting: Family

## 2022-10-03 ENCOUNTER — Other Ambulatory Visit: Payer: Self-pay | Admitting: Family

## 2022-10-03 DIAGNOSIS — F331 Major depressive disorder, recurrent, moderate: Secondary | ICD-10-CM

## 2022-10-03 DIAGNOSIS — K5900 Constipation, unspecified: Secondary | ICD-10-CM

## 2022-10-03 DIAGNOSIS — G2581 Restless legs syndrome: Secondary | ICD-10-CM

## 2022-10-03 DIAGNOSIS — F411 Generalized anxiety disorder: Secondary | ICD-10-CM

## 2022-10-18 DIAGNOSIS — M5416 Radiculopathy, lumbar region: Secondary | ICD-10-CM | POA: Diagnosis not present

## 2022-10-29 ENCOUNTER — Ambulatory Visit (INDEPENDENT_AMBULATORY_CARE_PROVIDER_SITE_OTHER): Payer: 59 | Admitting: Family

## 2022-10-29 ENCOUNTER — Encounter: Payer: Self-pay | Admitting: Family

## 2022-10-29 ENCOUNTER — Other Ambulatory Visit: Payer: Self-pay | Admitting: Family

## 2022-10-29 VITALS — BP 138/84 | HR 68 | Temp 96.2°F | Ht 61.0 in | Wt 166.0 lb

## 2022-10-29 DIAGNOSIS — E785 Hyperlipidemia, unspecified: Secondary | ICD-10-CM

## 2022-10-29 DIAGNOSIS — R8290 Unspecified abnormal findings in urine: Secondary | ICD-10-CM | POA: Diagnosis not present

## 2022-10-29 DIAGNOSIS — E781 Pure hyperglyceridemia: Secondary | ICD-10-CM | POA: Diagnosis not present

## 2022-10-29 DIAGNOSIS — Z0000 Encounter for general adult medical examination without abnormal findings: Secondary | ICD-10-CM

## 2022-10-29 DIAGNOSIS — F331 Major depressive disorder, recurrent, moderate: Secondary | ICD-10-CM

## 2022-10-29 DIAGNOSIS — R399 Unspecified symptoms and signs involving the genitourinary system: Secondary | ICD-10-CM

## 2022-10-29 DIAGNOSIS — E559 Vitamin D deficiency, unspecified: Secondary | ICD-10-CM | POA: Diagnosis not present

## 2022-10-29 DIAGNOSIS — F411 Generalized anxiety disorder: Secondary | ICD-10-CM

## 2022-10-29 DIAGNOSIS — M545 Low back pain, unspecified: Secondary | ICD-10-CM

## 2022-10-29 DIAGNOSIS — K5900 Constipation, unspecified: Secondary | ICD-10-CM

## 2022-10-29 DIAGNOSIS — E039 Hypothyroidism, unspecified: Secondary | ICD-10-CM | POA: Diagnosis not present

## 2022-10-29 DIAGNOSIS — G8929 Other chronic pain: Secondary | ICD-10-CM

## 2022-10-29 DIAGNOSIS — Z0001 Encounter for general adult medical examination with abnormal findings: Secondary | ICD-10-CM | POA: Diagnosis not present

## 2022-10-29 DIAGNOSIS — H66001 Acute suppurative otitis media without spontaneous rupture of ear drum, right ear: Secondary | ICD-10-CM | POA: Diagnosis not present

## 2022-10-29 DIAGNOSIS — I10 Essential (primary) hypertension: Secondary | ICD-10-CM

## 2022-10-29 DIAGNOSIS — M48061 Spinal stenosis, lumbar region without neurogenic claudication: Secondary | ICD-10-CM

## 2022-10-29 DIAGNOSIS — M1990 Unspecified osteoarthritis, unspecified site: Secondary | ICD-10-CM | POA: Insufficient documentation

## 2022-10-29 DIAGNOSIS — N951 Menopausal and female climacteric states: Secondary | ICD-10-CM

## 2022-10-29 DIAGNOSIS — K219 Gastro-esophageal reflux disease without esophagitis: Secondary | ICD-10-CM | POA: Diagnosis not present

## 2022-10-29 DIAGNOSIS — F1120 Opioid dependence, uncomplicated: Secondary | ICD-10-CM

## 2022-10-29 DIAGNOSIS — Z23 Encounter for immunization: Secondary | ICD-10-CM

## 2022-10-29 LAB — MICROSCOPIC EXAMINATION: Renal Epithel, UA: NONE SEEN /hpf

## 2022-10-29 LAB — URINALYSIS, COMPLETE
Bilirubin, UA: NEGATIVE
Glucose, UA: NEGATIVE
Ketones, UA: NEGATIVE
Nitrite, UA: NEGATIVE
Protein,UA: NEGATIVE
Specific Gravity, UA: >1.03 — ABNORMAL HIGH (ref 1.005–1.030)
Urobilinogen, Ur: 0.2 mg/dL (ref 0.2–1.0)
pH, UA: 5.5 (ref 5.0–7.5)

## 2022-10-29 MED ORDER — AMOXICILLIN-POT CLAVULANATE 875-125 MG PO TABS
1.0000 | ORAL_TABLET | Freq: Two times a day (BID) | ORAL | 0 refills | Status: DC
Start: 2022-10-29 — End: 2023-01-29

## 2022-10-29 NOTE — Progress Notes (Signed)
Subjective:    Patient ID: Brittany Lynch, female    DOB: Apr 16, 1961, 62 y.o.   MRN: [id number]  Chief Complaint  Patient presents with   Medical Management of Chronic Issues   Ear Fullness    Left side with pain    urine smell   Pt presents to the office today for CPE and chronic follow up.    She is followed by Neurosurgeon and pain management every month and has hx of lumbar laminectomy and hx of cervical spinal fusion. She is followed by Hematologists every 6 months for iron deficiency and thrombocytosis.  Ear Fullness  There is pain in the right ear. This is a new problem. The current episode started 1 to 4 weeks ago. The problem occurs constantly. There has been no fever. The pain is mild. Associated symptoms include hearing loss. Pertinent negatives include no diarrhea, ear discharge, rhinorrhea or sore throat. Treatments tried: peroxide. The treatment provided mild relief.  Hypertension This is a chronic problem. The current episode started more than 1 year ago. The problem has been resolved since onset. Associated symptoms include anxiety and peripheral edema (trace). Pertinent negatives include no malaise/fatigue or shortness of breath. Risk factors for coronary artery disease include dyslipidemia and sedentary lifestyle. The current treatment provides moderate improvement. Identifiable causes of hypertension include a thyroid problem.  Gastroesophageal Reflux She complains of belching and heartburn. She reports no hoarse voice, no nausea or no sore throat. This is a chronic problem. The current episode started more than 1 year ago. The problem occurs occasionally. Associated symptoms include fatigue. Risk factors include obesity. She has tried a PPI for the symptoms. The treatment provided moderate relief.  Thyroid Problem Presents for follow-up visit. Symptoms include anxiety, constipation, dry skin and fatigue. Patient reports no diarrhea or hoarse voice. The symptoms have been  stable.  Back Pain This is a chronic problem. The current episode started more than 1 year ago. The problem occurs intermittently. The pain is present in the lumbar spine. The quality of the pain is described as aching. The pain is at a severity of 3/10. The pain is mild. Associated symptoms include dysuria. She has tried analgesics for the symptoms. The treatment provided moderate relief.  Anxiety Presents for follow-up visit. Symptoms include excessive worry, nervous/anxious behavior and restlessness. Patient reports no nausea, shortness of breath or suicidal ideas. Symptoms occur occasionally. The severity of symptoms is moderate.   Her past medical history is significant for anemia.  Anemia Presents for follow-up visit. There has been no malaise/fatigue.  Depression        This is a chronic problem.  The current episode started more than 1 year ago.   The problem occurs intermittently.  Associated symptoms include fatigue, restlessness and sad.  Associated symptoms include no helplessness, no hopelessness and no suicidal ideas.  Past treatments include SNRIs - Serotonin and norepinephrine reuptake inhibitors.  Past medical history includes thyroid problem and anxiety.   Constipation This is a chronic problem. The current episode started more than 1 year ago. Her stool frequency is 1 time per day. Associated symptoms include back pain. Pertinent negatives include no diarrhea or nausea. She has tried laxatives (linzess) for the symptoms. The treatment provided moderate relief.  Dysuria  This is a new problem. The current episode started 1 to 4 weeks ago. The problem occurs intermittently. The problem has been waxing and waning. The quality of the pain is described as burning. The pain is mild. Associated  symptoms include frequency and urgency. Pertinent negatives include no hematuria or nausea. Associated symptoms comments: Urine odor . She has tried increased fluids for the symptoms. The treatment  provided mild relief.      Review of Systems  Constitutional:  Positive for fatigue. Negative for malaise/fatigue.  HENT:  Positive for hearing loss. Negative for ear discharge, hoarse voice, rhinorrhea and sore throat.   Respiratory:  Negative for shortness of breath.   Gastrointestinal:  Positive for constipation and heartburn. Negative for diarrhea and nausea.  Genitourinary:  Positive for dysuria, frequency and urgency. Negative for hematuria.  Musculoskeletal:  Positive for back pain.  Psychiatric/Behavioral:  Positive for depression. Negative for suicidal ideas. The patient is nervous/anxious.   All other systems reviewed and are negative.  Family History  Problem Relation Age of Onset   Mental illness Mother    Alcohol abuse Father    Diabetes Father    Heart disease Father    Breast cancer Neg Hx    Social History   Socioeconomic History   Marital status: Legally Separated    Spouse name: Not on file   Number of children: 3   Years of education: Not on file   Highest education level: 9th grade  Occupational History   Occupation: Retired  Tobacco Use   Smoking status: Former    Types: Cigarettes    Quit date: 07/16/2001    Years since quitting: 21.3   Smokeless tobacco: Never  Vaping Use   Vaping Use: Never used  Substance and Sexual Activity   Alcohol use: No    Comment: hx alcoholism. none since age 42   Drug use: Yes    Comment: once every 2 weeks [facility])   Sexual activity: Yes  Other Topics Concern   Not on file  Social History Narrative   Lives in apartment basement of daughters home    Social Determinants of Health   Financial Resource Strain: Low Risk  (09/03/2022)   Overall Financial Resource Strain (CARDIA)    Difficulty of Paying Living Expenses: Not hard at all  Food Insecurity: No Food Insecurity (09/03/2022)   Hunger Vital Sign    Worried About Running Out of Food in the Last Year: Never true    Ran Out of Food in the Last Year: Never true   Transportation Needs: No Transportation Needs (09/03/2022)   PRAPARE - Administrator, Civil Service (Medical): No    Lack of Transportation (Non-Medical): No  Physical Activity: Insufficiently Active (09/03/2022)   Exercise Vital Sign    Days of Exercise per Week: 3 days    Minutes of Exercise per Session: 30 min  Stress: No Stress Concern Present (09/03/2022)   [employer] of Occupational Health - Occupational Stress Questionnaire    Feeling of Stress : Not at all  Social Connections: Moderately Integrated (09/03/2022)   Social Connection and Isolation Panel [NHANES]    Frequency of Communication with Friends and Family: More than three times a week    Frequency of Social Gatherings with Friends and Family: Once a week    Attends Religious Services: More than 4 times per year    Active Member of [employer] or Organizations: Yes    Attends Banker Meetings: 1 to 4 times per year    Marital Status: Separated       Objective:   Physical Exam Vitals reviewed.  Constitutional:      General: She is not in acute distress.  Appearance: She is well-developed.  HENT:     Head: Normocephalic and atraumatic.     Right Ear: Tympanic membrane normal.     Left Ear: Tympanic membrane normal.  Eyes:     Pupils: Pupils are equal, round, and reactive to light.  Neck:     Thyroid: No thyromegaly.  Cardiovascular:     Rate and Rhythm: Normal rate and regular rhythm.     Heart sounds: Normal heart sounds. No murmur heard. Pulmonary:     Effort: Pulmonary effort is normal. No respiratory distress.     Breath sounds: Normal breath sounds. No wheezing.  Abdominal:     General: Bowel sounds are normal. There is no distension.     Palpations: Abdomen is soft.     Tenderness: There is no abdominal tenderness.  Musculoskeletal:        General: No tenderness. Normal range of motion.     Cervical back: Normal range of motion and neck supple.  Skin:    General: Skin is  warm and dry.  Neurological:     Mental Status: She is alert and oriented to person, place, and time.     Cranial Nerves: No cranial nerve deficit.     Deep Tendon Reflexes: Reflexes are normal and symmetric.  Psychiatric:        Behavior: Behavior normal.        Thought Content: Thought content normal.        Judgment: Judgment normal.       BP 138/84   Pulse 68   Temp (!) 96.2 F (35.7 C) (Temporal)   Ht 5\' 1"  (1.549 m)   Wt 166 lb (75.3 kg)   SpO2 96%   BMI 31.37 kg/m      Assessment & Plan:   SHEBRA DALOISIO comes in today with chief complaint of Medical Management of Chronic Issues, Ear Fullness (Left side with pain ), and urine smell   Diagnosis and orders addressed:  1. Annual physical exam - CMP14+EGFR - CBC with Differential/Platelet - Lipid panel - Urinalysis, Complete - TSH - VITAMIN D 25 Hydroxy (Vit-D Deficiency, Fractures) - Urine Culture  2. Primary hypertension - CMP14+EGFR - CBC with Differential/Platelet  3. Constipation, unspecified constipation type - CMP14+EGFR - CBC with Differential/Platelet  4. Hyperlipidemia, unspecified hyperlipidemia type - CMP14+EGFR - CBC with Differential/Platelet  5. Moderate episode of recurrent major depressive disorder - CMP14+EGFR - CBC with Differential/Platelet  6. GAD (generalized anxiety disorder) - CMP14+EGFR - CBC with Differential/Platelet  7. Chronic low back pain, unspecified back pain laterality, unspecified whether sciatica present - CMP14+EGFR - CBC with Differential/Platelet  8. Hypothyroidism, unspecified type - CMP14+EGFR - CBC with Differential/Platelet  9. Vitamin D deficiency - CMP14+EGFR - CBC with Differential/Platelet  10. Hypertriglyceridemia - CMP14+EGFR - CBC with Differential/Platelet  11. Gastroesophageal reflux disease, unspecified whether esophagitis present - CMP14+EGFR - CBC with Differential/Platelet  12. Continuous opioid dependence - CMP14+EGFR - CBC  with Differential/Platelet  13. Spinal stenosis of lumbar region without neurogenic claudication - CMP14+EGFR - CBC with Differential/Platelet  14. Anxiety state - CMP14+EGFR - CBC with Differential/Platelet  15. Abnormal urine odor - CMP14+EGFR - CBC with Differential/Platelet - Urinalysis, Complete - Urine Culture  16. Non-recurrent acute suppurative otitis media of right ear without spontaneous rupture of tympanic membrane Start Augmentin  Tylenol   17. UTI symptoms Start Augmentin Force fluids AZO over the counter X2 days RTO prn Culture pending - amoxicillin-clavulanate (AUGMENTIN) 875-125 MG tablet; Take 1 tablet by  mouth 2 (two) times daily.  Dispense: 14 tablet; Refill: 0   Labs pending Health Maintenance reviewed Diet and exercise encouraged  Follow up plan: 4 months    Jannifer Rodney, FNP

## 2022-10-29 NOTE — Patient Instructions (Signed)

## 2022-10-30 ENCOUNTER — Other Ambulatory Visit: Payer: Self-pay | Admitting: Family

## 2022-10-30 LAB — LIPID PANEL
Chol/HDL Ratio: 2.8 ratio (ref 0.0–4.4)
Cholesterol, Total: 201 mg/dL — ABNORMAL HIGH (ref 100–199)
HDL: 71 mg/dL (ref >39)
LDL Chol Calc (NIH): 108 mg/dL — ABNORMAL HIGH (ref 0–99)
Triglycerides: 125 mg/dL (ref 0–149)
VLDL Cholesterol Cal: 22 mg/dL (ref 5–40)

## 2022-10-30 LAB — VITAMIN D 25 HYDROXY (VIT D DEFICIENCY, FRACTURES): Vit D, 25-Hydroxy: 24.8 ng/mL — ABNORMAL LOW (ref 30.0–100.0)

## 2022-10-30 LAB — CBC WITH DIFFERENTIAL/PLATELET
Basophils Absolute: 0.1 10*3/uL (ref 0.0–0.2)
Basos: 1 %
EOS (ABSOLUTE): 0.1 10*3/uL (ref 0.0–0.4)
Eos: 2 %
Hematocrit: 41.5 % (ref 34.0–46.6)
Hemoglobin: 13.1 g/dL (ref 11.1–15.9)
Immature Grans (Abs): 0 10*3/uL (ref 0.0–0.1)
Immature Granulocytes: 0 %
Lymphocytes Absolute: 1.9 10*3/uL (ref 0.7–3.1)
Lymphs: 34 %
MCH: 27.6 pg (ref 26.6–33.0)
MCHC: 31.6 g/dL (ref 31.5–35.7)
MCV: 87 fL (ref 79–97)
Monocytes Absolute: 0.5 10*3/uL (ref 0.1–0.9)
Monocytes: 9 %
Neutrophils Absolute: 3.1 10*3/uL (ref 1.4–7.0)
Neutrophils: 54 %
Platelets: 430 10*3/uL (ref 150–450)
RBC: 4.75 x10E6/uL (ref 3.77–5.28)
RDW: 12.5 % (ref 11.7–15.4)
WBC: 5.8 10*3/uL (ref 3.4–10.8)

## 2022-10-30 LAB — CMP14+EGFR
ALT: 29 IU/L (ref 0–32)
AST: 25 IU/L (ref 0–40)
Albumin/Globulin Ratio: 2.2 (ref 1.2–2.2)
Albumin: 4.8 g/dL (ref 3.9–4.9)
Alkaline Phosphatase: 45 IU/L (ref 44–121)
BUN/Creatinine Ratio: 10 — ABNORMAL LOW (ref 12–28)
BUN: 10 mg/dL (ref 8–27)
Bilirubin Total: 0.3 mg/dL (ref 0.0–1.2)
CO2: 24 mmol/L (ref 20–29)
Calcium: 10.3 mg/dL (ref 8.7–10.3)
Chloride: 103 mmol/L (ref 96–106)
Creatinine, Ser: 0.96 mg/dL (ref 0.57–1.00)
Globulin, Total: 2.2 g/dL (ref 1.5–4.5)
Glucose: 91 mg/dL (ref 70–99)
Potassium: 4.9 mmol/L (ref 3.5–5.2)
Sodium: 143 mmol/L (ref 134–144)
Total Protein: 7 g/dL (ref 6.0–8.5)
eGFR: 67 mL/min/{1.73_m2} (ref >59)

## 2022-10-30 LAB — TSH: TSH: 0.892 u[IU]/mL (ref 0.450–4.500)

## 2022-10-30 MED ORDER — VITAMIN D (ERGOCALCIFEROL) 1.25 MG (50000 UNIT) PO CAPS: 50000.0000 [IU] | ORAL_CAPSULE | ORAL | 3 refills | Status: DC

## 2022-10-31 LAB — URINE CULTURE

## 2022-11-02 ENCOUNTER — Telehealth: Payer: Self-pay | Admitting: Family

## 2022-11-02 ENCOUNTER — Other Ambulatory Visit: Payer: Self-pay | Admitting: Family

## 2022-11-02 DIAGNOSIS — E785 Hyperlipidemia, unspecified: Secondary | ICD-10-CM

## 2022-11-02 MED ORDER — VITAMIN D (ERGOCALCIFEROL) 1.25 MG (50000 UNIT) PO CAPS: 50000.0000 [IU] | ORAL_CAPSULE | ORAL | 3 refills | Status: DC

## 2022-11-02 NOTE — Telephone Encounter (Signed)
Resent

## 2022-11-02 NOTE — Telephone Encounter (Signed)
Pt called stating that [facility] in [facility] is her pharmacy and her Vitamin D Rx needs to be sent there because she doesn't use [facility], [facility]. Also says if there are any other meds that were sent to a different pharmacy, to go ahead and send them to [facility] in [place] too.

## 2022-11-21 DIAGNOSIS — Z9889 Other specified postprocedural states: Secondary | ICD-10-CM | POA: Diagnosis not present

## 2022-11-21 DIAGNOSIS — M5412 Radiculopathy, cervical region: Secondary | ICD-10-CM | POA: Diagnosis not present

## 2022-11-21 DIAGNOSIS — M5416 Radiculopathy, lumbar region: Secondary | ICD-10-CM | POA: Diagnosis not present

## 2022-12-01 ENCOUNTER — Other Ambulatory Visit: Payer: Self-pay | Admitting: Family

## 2022-12-12 DIAGNOSIS — Z1211 Encounter for screening for malignant neoplasm of colon: Secondary | ICD-10-CM | POA: Diagnosis not present

## 2022-12-12 DIAGNOSIS — I10 Essential (primary) hypertension: Secondary | ICD-10-CM | POA: Diagnosis not present

## 2022-12-12 DIAGNOSIS — Z09 Encounter for follow-up examination after completed treatment for conditions other than malignant neoplasm: Secondary | ICD-10-CM | POA: Diagnosis not present

## 2022-12-12 DIAGNOSIS — Z538 Procedure and treatment not carried out for other reasons: Secondary | ICD-10-CM | POA: Diagnosis not present

## 2022-12-12 DIAGNOSIS — K5730 Diverticulosis of large intestine without perforation or abscess without bleeding: Secondary | ICD-10-CM | POA: Diagnosis not present

## 2022-12-12 DIAGNOSIS — Z86010 Personal history of colonic polyps: Secondary | ICD-10-CM | POA: Diagnosis not present

## 2022-12-31 ENCOUNTER — Other Ambulatory Visit: Payer: Self-pay | Admitting: Family

## 2022-12-31 ENCOUNTER — Encounter: Payer: Self-pay | Admitting: Family

## 2022-12-31 DIAGNOSIS — F411 Generalized anxiety disorder: Secondary | ICD-10-CM

## 2022-12-31 DIAGNOSIS — G2581 Restless legs syndrome: Secondary | ICD-10-CM

## 2022-12-31 DIAGNOSIS — K5900 Constipation, unspecified: Secondary | ICD-10-CM

## 2022-12-31 DIAGNOSIS — F331 Major depressive disorder, recurrent, moderate: Secondary | ICD-10-CM

## 2022-12-31 NOTE — Telephone Encounter (Signed)
[facility] NTBS in Aug for 4 mos FU RFs sent to pharmacy

## 2022-12-31 NOTE — Telephone Encounter (Signed)
LMTCB to schedule appt Letter mailed 

## 2023-01-28 DIAGNOSIS — M5416 Radiculopathy, lumbar region: Secondary | ICD-10-CM | POA: Diagnosis not present

## 2023-01-29 ENCOUNTER — Ambulatory Visit (INDEPENDENT_AMBULATORY_CARE_PROVIDER_SITE_OTHER): Payer: 59 | Admitting: Family

## 2023-01-29 ENCOUNTER — Encounter: Payer: Self-pay | Admitting: Family

## 2023-01-29 VITALS — BP 134/84 | HR 68 | Temp 97.9°F | Ht 61.0 in | Wt 167.4 lb

## 2023-01-29 DIAGNOSIS — E039 Hypothyroidism, unspecified: Secondary | ICD-10-CM | POA: Diagnosis not present

## 2023-01-29 DIAGNOSIS — G2581 Restless legs syndrome: Secondary | ICD-10-CM | POA: Diagnosis not present

## 2023-01-29 DIAGNOSIS — F331 Major depressive disorder, recurrent, moderate: Secondary | ICD-10-CM

## 2023-01-29 DIAGNOSIS — M4807 Spinal stenosis, lumbosacral region: Secondary | ICD-10-CM

## 2023-01-29 DIAGNOSIS — F411 Generalized anxiety disorder: Secondary | ICD-10-CM | POA: Diagnosis not present

## 2023-01-29 DIAGNOSIS — K5900 Constipation, unspecified: Secondary | ICD-10-CM

## 2023-01-29 DIAGNOSIS — K219 Gastro-esophageal reflux disease without esophagitis: Secondary | ICD-10-CM

## 2023-01-29 DIAGNOSIS — M48061 Spinal stenosis, lumbar region without neurogenic claudication: Secondary | ICD-10-CM

## 2023-01-29 DIAGNOSIS — E785 Hyperlipidemia, unspecified: Secondary | ICD-10-CM | POA: Diagnosis not present

## 2023-01-29 DIAGNOSIS — M545 Low back pain, unspecified: Secondary | ICD-10-CM

## 2023-01-29 DIAGNOSIS — E611 Iron deficiency: Secondary | ICD-10-CM

## 2023-01-29 DIAGNOSIS — I10 Essential (primary) hypertension: Secondary | ICD-10-CM

## 2023-01-29 DIAGNOSIS — N951 Menopausal and female climacteric states: Secondary | ICD-10-CM

## 2023-01-29 MED ORDER — LINACLOTIDE 145 MCG PO CAPS
145.0000 ug | ORAL_CAPSULE | Freq: Every day | ORAL | 1 refills | Status: DC
Start: 2023-01-29 — End: 2023-04-25

## 2023-01-29 MED ORDER — ATORVASTATIN CALCIUM 20 MG PO TABS: 20.0000 mg | ORAL_TABLET | Freq: Every day | ORAL | 1 refills | Status: DC

## 2023-01-29 MED ORDER — METOPROLOL TARTRATE 50 MG PO TABS
50.0000 mg | ORAL_TABLET | Freq: Two times a day (BID) | ORAL | 1 refills | Status: DC
Start: 2023-01-29 — End: 2023-04-25

## 2023-01-29 MED ORDER — DESVENLAFAXINE SUCCINATE ER 100 MG PO TB24: 100.0000 mg | ORAL_TABLET | Freq: Every day | ORAL | 0 refills | Status: DC

## 2023-01-29 MED ORDER — ESTRADIOL 2 MG PO TABS: 2.0000 mg | ORAL_TABLET | Freq: Every day | ORAL | 3 refills | Status: DC

## 2023-01-29 MED ORDER — FENOFIBRATE 145 MG PO TABS: 145.0000 mg | ORAL_TABLET | Freq: Every day | ORAL | 1 refills | Status: DC

## 2023-01-29 MED ORDER — BUPROPION HCL ER (XL) 300 MG PO TB24
300.0000 mg | ORAL_TABLET | Freq: Every day | ORAL | 0 refills | Status: DC
Start: 2023-01-29 — End: 2023-04-25

## 2023-01-29 MED ORDER — LEVOTHYROXINE SODIUM 100 MCG PO TABS: 100.0000 ug | ORAL_TABLET | Freq: Every day | ORAL | 3 refills | Status: DC

## 2023-01-29 MED ORDER — PRAMIPEXOLE DIHYDROCHLORIDE 0.5 MG PO TABS
ORAL_TABLET | ORAL | 0 refills | Status: DC
Start: 2023-01-29 — End: 2023-04-25

## 2023-01-29 NOTE — Progress Notes (Signed)
Subjective:    Patient ID: Brittany Lynch, female    DOB: 23-Apr-1961, 62 y.o.   MRN: [id number]  Chief Complaint  Patient presents with   Medical Management of Chronic Issues    Ear gets sore x 1 year    Pt presents to the office today for chronic follow up.    She is followed by Neurosurgeon and pain management every month and has hx of lumbar laminectomy and hx of cervical spinal fusion.   She is followed by Hematologists every 6 months for iron deficiency and thrombocytosis.  She has RLS and takes mirapex 1 mg that helps.  Hypertension This is a chronic problem. The current episode started more than 1 year ago. The problem has been resolved since onset. The problem is controlled. Associated symptoms include anxiety and malaise/fatigue. Pertinent negatives include no peripheral edema or shortness of breath. Risk factors for coronary artery disease include dyslipidemia, obesity and sedentary lifestyle. The current treatment provides moderate improvement. Identifiable causes of hypertension include a thyroid problem.  Gastroesophageal Reflux She complains of belching and heartburn. She reports no hoarse voice. This is a chronic problem. The current episode started more than 1 year ago. The problem occurs occasionally. The symptoms are aggravated by certain foods. Associated symptoms include fatigue. She has tried an antacid and a PPI for the symptoms. The treatment provided moderate relief.  Thyroid Problem Presents for follow-up visit. Symptoms include anxiety, constipation and fatigue. Patient reports no hoarse voice. The symptoms have been stable. Her past medical history is significant for hyperlipidemia.  Back Pain This is a chronic problem. The current episode started more than 1 year ago. The problem occurs intermittently. The problem has been waxing and waning since onset. The pain is present in the lumbar spine. The pain is at a severity of 3/10. The pain is moderate. She has  tried analgesics for the symptoms. The treatment provided moderate relief.  Anxiety Presents for follow-up visit. Symptoms include excessive worry and nervous/anxious behavior. Patient reports no shortness of breath. Symptoms occur most days. The severity of symptoms is mild.    Hyperlipidemia This is a chronic problem. The current episode started more than 1 year ago. The problem is uncontrolled. Recent lipid tests were reviewed and are high. Exacerbating diseases include obesity. Pertinent negatives include no shortness of breath. Current antihyperlipidemic treatment includes statins. The current treatment provides mild improvement of lipids. Risk factors for coronary artery disease include dyslipidemia, hypertension and a sedentary lifestyle.  Depression        This is a chronic problem.  The current episode started more than 1 year ago.   The problem occurs intermittently.  Associated symptoms include fatigue.  Associated symptoms include no helplessness, no hopelessness and not sad.  Past treatments include SNRIs - Serotonin and norepinephrine reuptake inhibitors.  Past medical history includes thyroid problem and anxiety.   Constipation This is a chronic problem. The current episode started more than 1 year ago. The problem has been resolved since onset. Her stool frequency is 1 time per day. Associated symptoms include back pain. She has tried laxatives for the symptoms. The treatment provided moderate relief.      Review of Systems  Constitutional:  Positive for fatigue and malaise/fatigue.  HENT:  Negative for hoarse voice.   Respiratory:  Negative for shortness of breath.   Gastrointestinal:  Positive for constipation and heartburn.  Musculoskeletal:  Positive for back pain.  Psychiatric/Behavioral:  Positive for depression. The patient is nervous/anxious.  All other systems reviewed and are negative.      Objective:   Physical Exam Vitals reviewed.  Constitutional:       General: She is not in acute distress.    Appearance: She is well-developed. She is obese.  HENT:     Head: Normocephalic and atraumatic.     Right Ear: Tympanic membrane normal.     Left Ear: Tympanic membrane normal.  Eyes:     Pupils: Pupils are equal, round, and reactive to light.  Neck:     Thyroid: No thyromegaly.  Cardiovascular:     Rate and Rhythm: Normal rate and regular rhythm.     Heart sounds: Normal heart sounds. No murmur heard. Pulmonary:     Effort: Pulmonary effort is normal. No respiratory distress.     Breath sounds: Normal breath sounds. No wheezing.  Abdominal:     General: Bowel sounds are normal. There is no distension.     Palpations: Abdomen is soft.     Tenderness: There is no abdominal tenderness.  Musculoskeletal:        General: No tenderness. Normal range of motion.     Cervical back: Normal range of motion and neck supple.  Skin:    General: Skin is warm and dry.  Neurological:     Mental Status: She is alert and oriented to person, place, and time.     Cranial Nerves: No cranial nerve deficit.     Deep Tendon Reflexes: Reflexes are normal and symmetric.  Psychiatric:        Mood and Affect: Mood is anxious.        Behavior: Behavior normal.        Thought Content: Thought content normal.        Judgment: Judgment normal.          BP 134/84   Pulse 68   Temp 97.9 F (36.6 C) (Temporal)   Ht 5\' 1"  (1.549 m)   Wt 167 lb 6.4 oz (75.9 kg)   SpO2 99%   BMI 31.63 kg/m   Assessment & Plan:  PAYDEN BONUS comes in today with chief complaint of Medical Management of Chronic Issues (Ear gets sore x 1 year )   Diagnosis and orders addressed:  1. Hyperlipidemia, unspecified hyperlipidemia type - atorvastatin (LIPITOR) 20 MG tablet; Take 1 tablet (20 mg total) by mouth daily.  Dispense: 90 tablet; Refill: 1 - fenofibrate (TRICOR) 145 MG tablet; Take 1 tablet (145 mg total) by mouth daily.  Dispense: 90 tablet; Refill: 1 -  CMP14+EGFR  2. GAD (generalized anxiety disorder) - buPROPion (WELLBUTRIN XL) 300 MG 24 hr tablet; Take 1 tablet (300 mg total) by mouth daily.  Dispense: 90 tablet; Refill: 0 - desvenlafaxine (PRISTIQ) 100 MG 24 hr tablet; Take 1 tablet (100 mg total) by mouth daily.  Dispense: 90 tablet; Refill: 0 - CMP14+EGFR  3. Moderate episode of recurrent major depressive disorder (HCC) - buPROPion (WELLBUTRIN XL) 300 MG 24 hr tablet; Take 1 tablet (300 mg total) by mouth daily.  Dispense: 90 tablet; Refill: 0 - desvenlafaxine (PRISTIQ) 100 MG 24 hr tablet; Take 1 tablet (100 mg total) by mouth daily.  Dispense: 90 tablet; Refill: 0 - CMP14+EGFR  4. Hot flash, menopausal - estradiol (ESTRACE) 2 MG tablet; Take 1 tablet (2 mg total) by mouth daily.  Dispense: 90 tablet; Refill: 3 - CMP14+EGFR  5. Constipation, unspecified constipation type - linaclotide (LINZESS) 145 MCG CAPS capsule; Take 1 capsule (145 mcg total)  by mouth daily before breakfast.  Dispense: 90 capsule; Refill: 1 - CMP14+EGFR  6. Essential hypertension  - metoprolol tartrate (LOPRESSOR) 50 MG tablet; Take 1 tablet (50 mg total) by mouth 2 (two) times daily.  Dispense: 180 tablet; Refill: 1 - CMP14+EGFR  7. RLS (restless legs syndrome) - pramipexole (MIRAPEX) 0.5 MG tablet; TAKE TWO TABLETS BY MOUTH EVERY EVENING  Dispense: 180 tablet; Refill: 0 - CMP14+EGFR  8. Primary hypertension  - CMP14+EGFR  9. Hypothyroidism, unspecified type - levothyroxine (SYNTHROID) 100 MCG tablet; Take 1 tablet (100 mcg total) by mouth daily.  Dispense: 90 tablet; Refill: 3 - CMP14+EGFR  10. Chronic low back pain, unspecified back pain laterality, unspecified whether sciatica present  - CMP14+EGFR  11. Gastroesophageal reflux disease, unspecified whether esophagitis present - CMP14+EGFR  12. Iron deficiency - CMP14+EGFR  13. Lumbosacral stenosis - CMP14+EGFR  14. Spinal stenosis of lumbar region without neurogenic claudication  -  CMP14+EGFR   Labs pending Continue current medications  Health Maintenance reviewed Diet and exercise encouraged  Follow up plan: 6 months    Jannifer Rodney, FNP

## 2023-01-29 NOTE — Patient Instructions (Signed)

## 2023-01-30 LAB — CMP14+EGFR
ALT: 18 IU/L (ref 0–32)
AST: 16 IU/L (ref 0–40)
Albumin: 4.7 g/dL (ref 3.9–4.9)
Alkaline Phosphatase: 44 IU/L (ref 44–121)
BUN/Creatinine Ratio: 17 (ref 12–28)
BUN: 15 mg/dL (ref 8–27)
Bilirubin Total: <0.2 mg/dL (ref 0.0–1.2)
CO2: 25 mmol/L (ref 20–29)
Calcium: 9.9 mg/dL (ref 8.7–10.3)
Chloride: 100 mmol/L (ref 96–106)
Creatinine, Ser: 0.89 mg/dL (ref 0.57–1.00)
Globulin, Total: 2.3 g/dL (ref 1.5–4.5)
Glucose: 86 mg/dL (ref 70–99)
Potassium: 4.9 mmol/L (ref 3.5–5.2)
Sodium: 139 mmol/L (ref 134–144)
Total Protein: 7 g/dL (ref 6.0–8.5)
eGFR: 73 mL/min/{1.73_m2} (ref >59)

## 2023-02-06 DIAGNOSIS — E7800 Pure hypercholesterolemia, unspecified: Secondary | ICD-10-CM | POA: Diagnosis not present

## 2023-02-06 DIAGNOSIS — Z86010 Personal history of colonic polyps: Secondary | ICD-10-CM | POA: Diagnosis not present

## 2023-02-06 DIAGNOSIS — Z09 Encounter for follow-up examination after completed treatment for conditions other than malignant neoplasm: Secondary | ICD-10-CM | POA: Diagnosis not present

## 2023-02-06 DIAGNOSIS — Z1211 Encounter for screening for malignant neoplasm of colon: Secondary | ICD-10-CM | POA: Diagnosis not present

## 2023-02-06 DIAGNOSIS — K5730 Diverticulosis of large intestine without perforation or abscess without bleeding: Secondary | ICD-10-CM | POA: Diagnosis not present

## 2023-02-06 LAB — HM COLONOSCOPY

## 2023-02-13 DIAGNOSIS — M5416 Radiculopathy, lumbar region: Secondary | ICD-10-CM | POA: Diagnosis not present

## 2023-02-13 DIAGNOSIS — G894 Chronic pain syndrome: Secondary | ICD-10-CM | POA: Diagnosis not present

## 2023-02-13 DIAGNOSIS — Z9889 Other specified postprocedural states: Secondary | ICD-10-CM | POA: Diagnosis not present

## 2023-04-01 ENCOUNTER — Other Ambulatory Visit: Payer: Self-pay | Admitting: Family

## 2023-04-25 ENCOUNTER — Other Ambulatory Visit: Payer: Self-pay | Admitting: Family

## 2023-04-25 DIAGNOSIS — E785 Hyperlipidemia, unspecified: Secondary | ICD-10-CM

## 2023-04-25 DIAGNOSIS — K5900 Constipation, unspecified: Secondary | ICD-10-CM

## 2023-04-25 DIAGNOSIS — G2581 Restless legs syndrome: Secondary | ICD-10-CM

## 2023-04-25 DIAGNOSIS — F411 Generalized anxiety disorder: Secondary | ICD-10-CM

## 2023-04-25 DIAGNOSIS — I10 Essential (primary) hypertension: Secondary | ICD-10-CM

## 2023-04-25 DIAGNOSIS — F331 Major depressive disorder, recurrent, moderate: Secondary | ICD-10-CM

## 2023-04-26 IMAGING — MG MM DIGITAL DIAGNOSTIC UNILAT*R* W/ TOMO W/ CAD
6 series · 6 of 18 positions shown · non-contrast
Comparison: Previous exam(s).

CLINICAL DATA: The patient was called back for a right breast
asymmetry.

EXAM:
DIGITAL DIAGNOSTIC UNILATERAL RIGHT MAMMOGRAM WITH TOMOSYNTHESIS AND
CAD
TECHNIQUE: Right digital diagnostic mammography and breast tomosynthesis was
performed. The images were evaluated with computer-aided detection.

[R ML synth-2D]
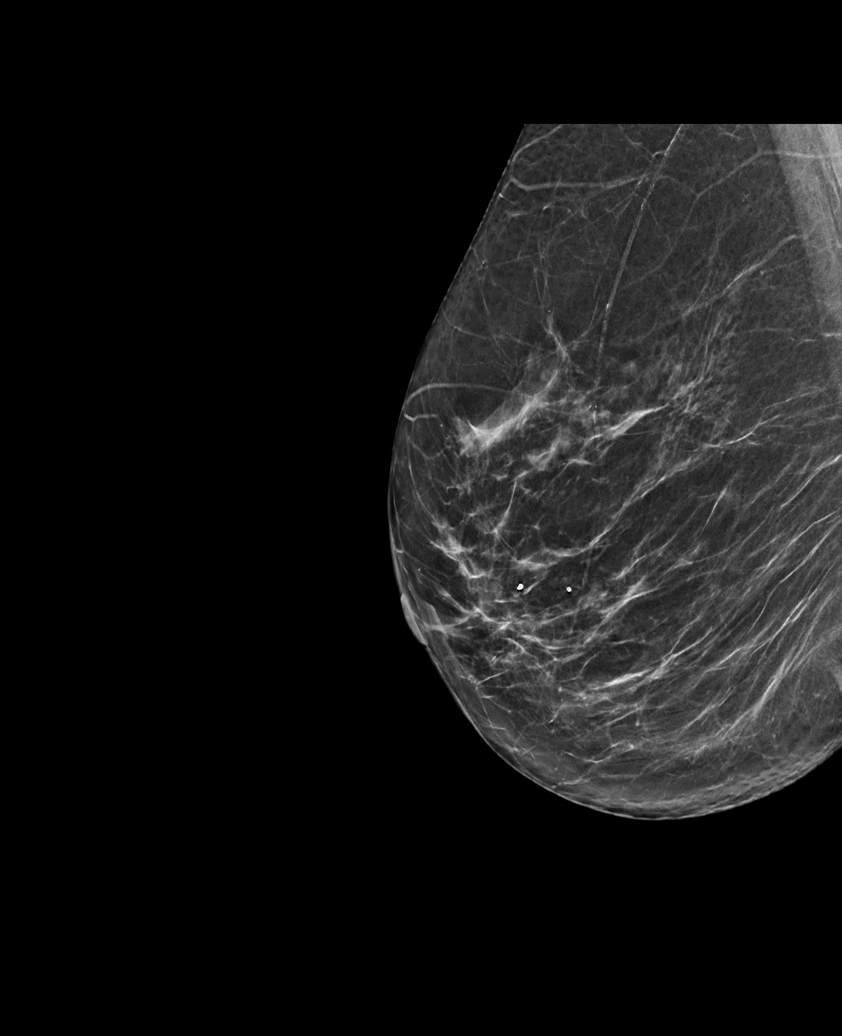

[R MLO synth-2D (1 of 2)]
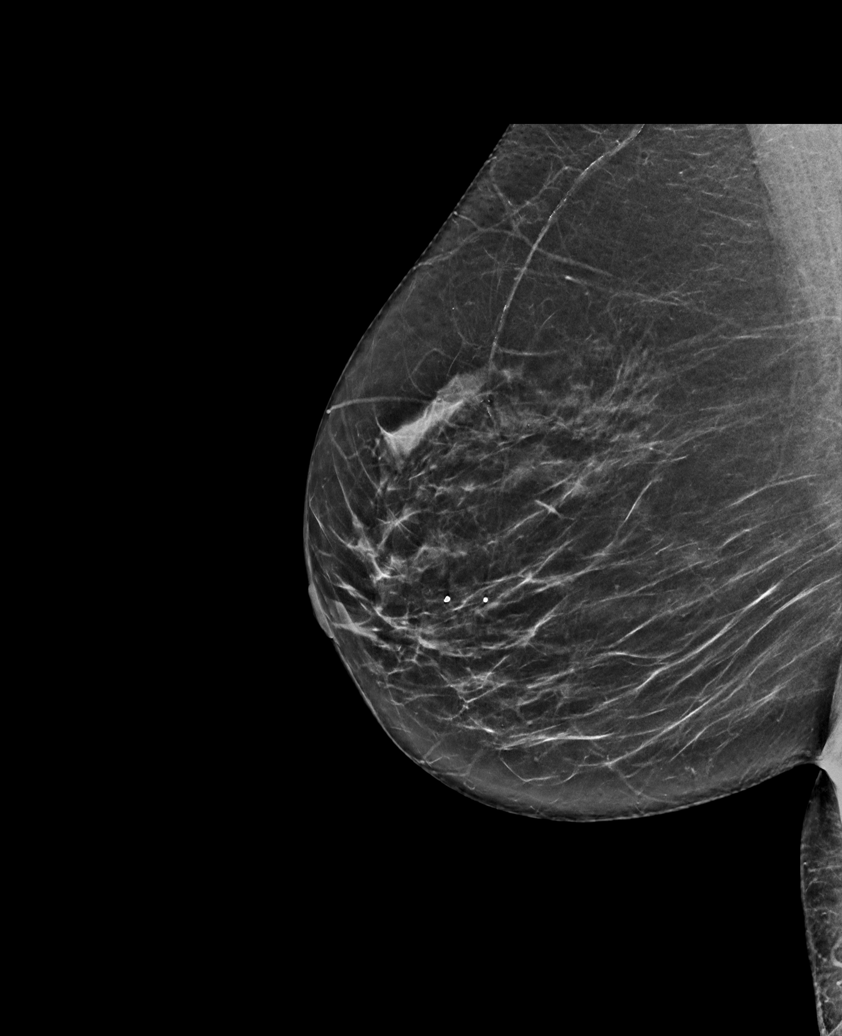

[R MLO synth-2D (2 of 2)]
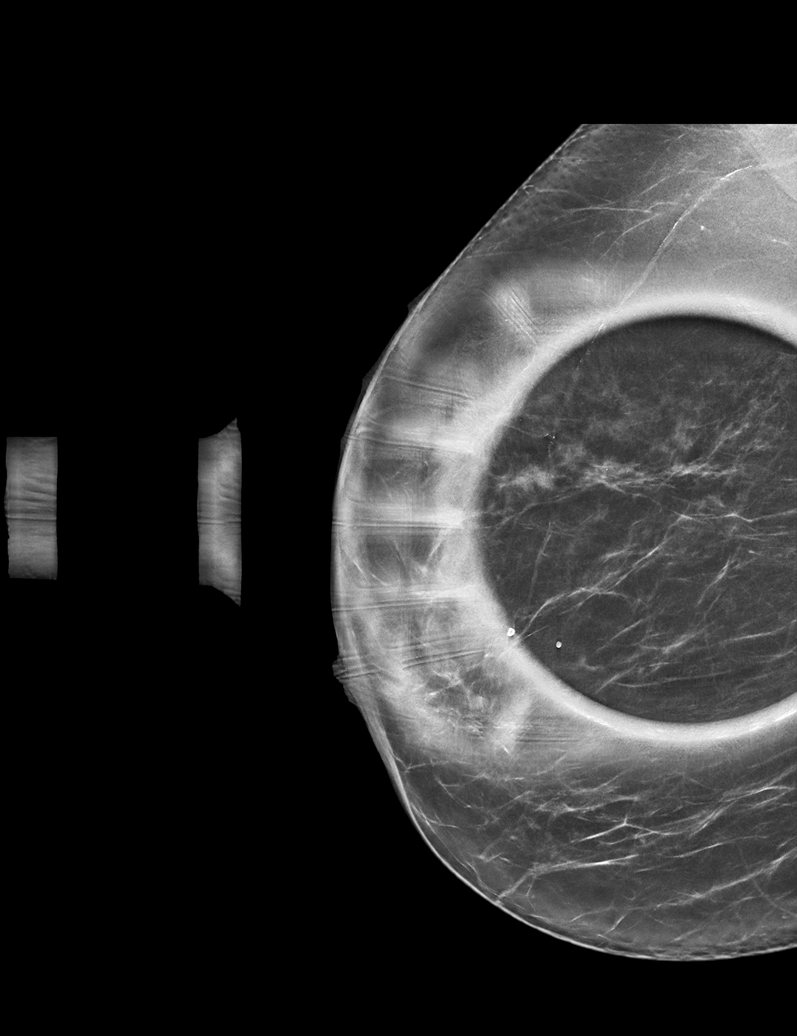

[R MLO tomo (1 of 2) · tomo slice 34/67.0]
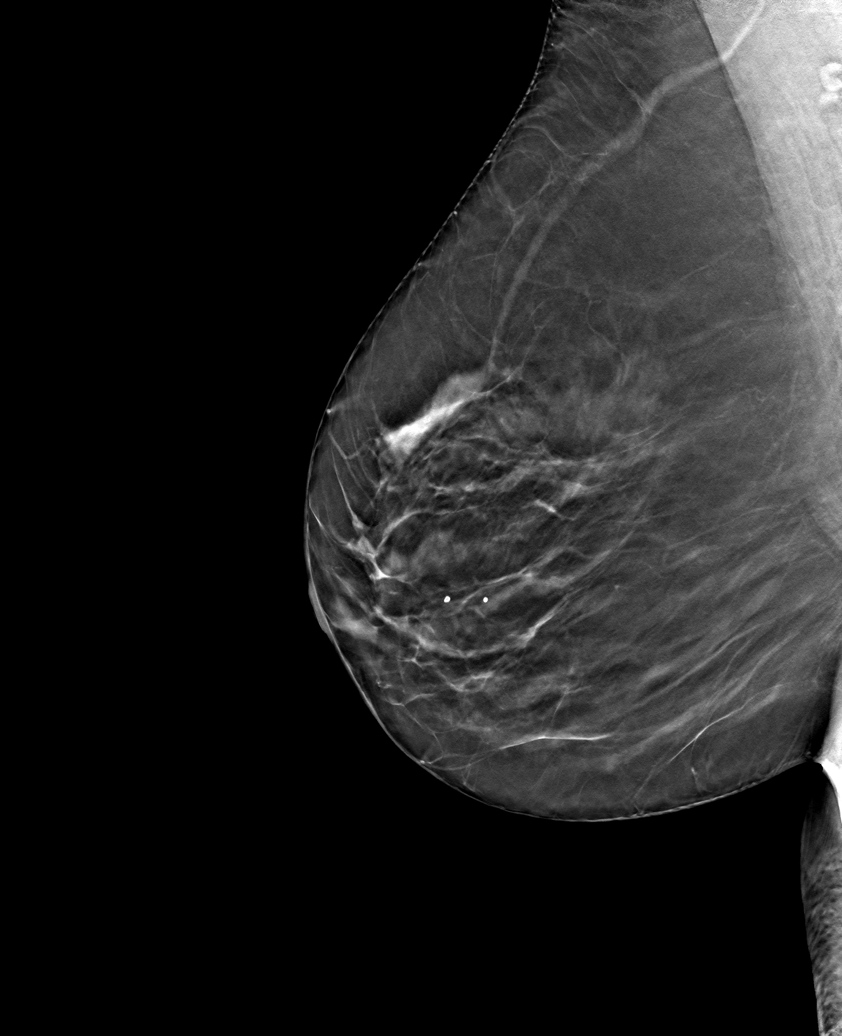

[R MLO tomo (2 of 2) · tomo slice 31/60.0]
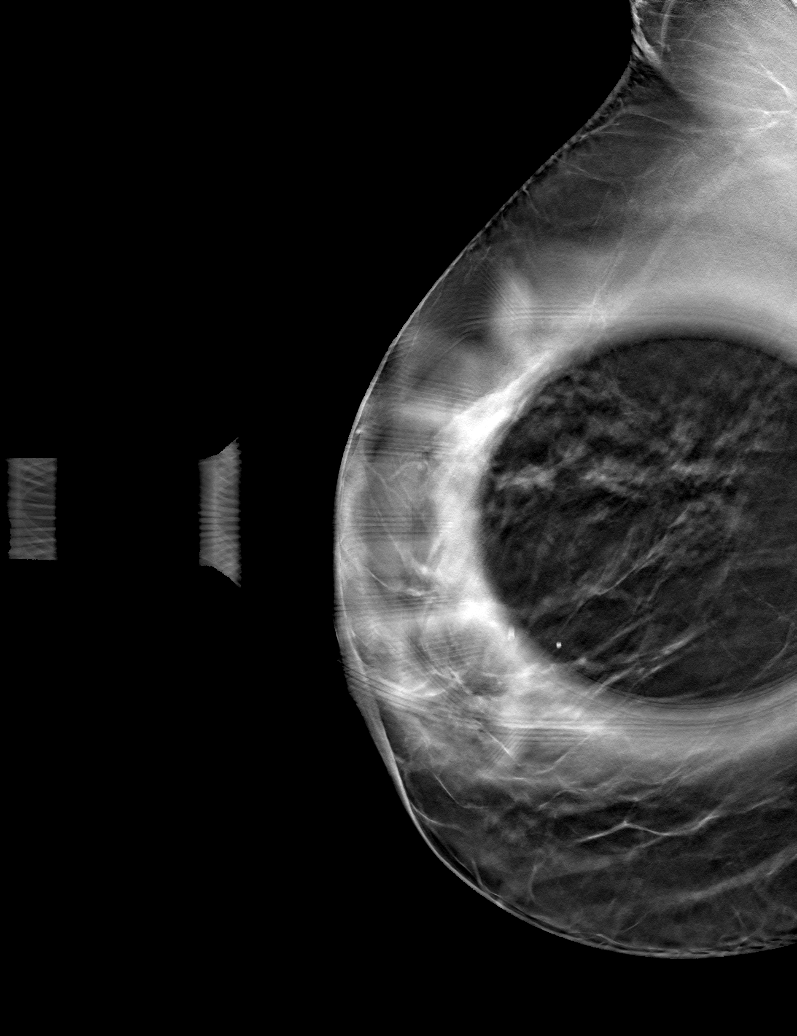

[R ML tomo · tomo slice 34/67.0]
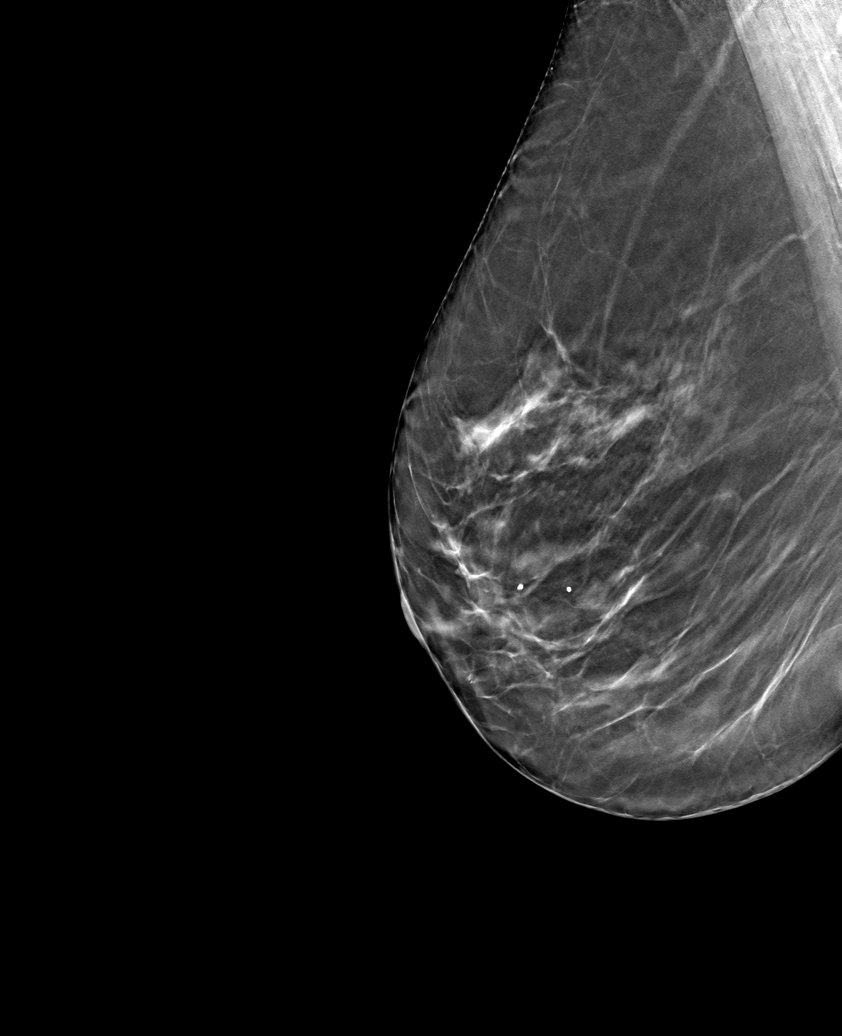

[6 of 18 positions shown; findings below may reference images not displayed]

ACR Breast Density Category b: There are scattered areas of
fibroglandular density.
FINDINGS: The right breast asymmetry resolves on today's imaging.
IMPRESSION: No mammographic evidence of malignancy.

RECOMMENDATION:
Annual screening mammography.

I have discussed the findings and recommendations with the patient.
If applicable, a reminder letter will be sent to the patient
regarding the next appointment.

BI-RADS CATEGORY  2: Benign.

## 2023-05-01 DIAGNOSIS — Z800 Family history of malignant neoplasm of digestive organs: Secondary | ICD-10-CM | POA: Diagnosis not present

## 2023-05-01 DIAGNOSIS — K5900 Constipation, unspecified: Secondary | ICD-10-CM | POA: Diagnosis not present

## 2023-05-02 DIAGNOSIS — M5416 Radiculopathy, lumbar region: Secondary | ICD-10-CM | POA: Diagnosis not present

## 2023-05-28 DIAGNOSIS — M4726 Other spondylosis with radiculopathy, lumbar region: Secondary | ICD-10-CM | POA: Diagnosis not present

## 2023-05-28 DIAGNOSIS — M5416 Radiculopathy, lumbar region: Secondary | ICD-10-CM | POA: Diagnosis not present

## 2023-05-28 DIAGNOSIS — M4856XA Collapsed vertebra, not elsewhere classified, lumbar region, initial encounter for fracture: Secondary | ICD-10-CM | POA: Diagnosis not present

## 2023-05-28 DIAGNOSIS — M4857XA Collapsed vertebra, not elsewhere classified, lumbosacral region, initial encounter for fracture: Secondary | ICD-10-CM | POA: Diagnosis not present

## 2023-05-28 DIAGNOSIS — M48061 Spinal stenosis, lumbar region without neurogenic claudication: Secondary | ICD-10-CM | POA: Diagnosis not present

## 2023-05-29 DIAGNOSIS — M5416 Radiculopathy, lumbar region: Secondary | ICD-10-CM | POA: Diagnosis not present

## 2023-06-20 DIAGNOSIS — M5416 Radiculopathy, lumbar region: Secondary | ICD-10-CM | POA: Diagnosis not present

## 2023-07-22 ENCOUNTER — Inpatient Hospital Stay: Payer: 59 | Attending: Physician Assistant

## 2023-07-22 DIAGNOSIS — E039 Hypothyroidism, unspecified: Secondary | ICD-10-CM | POA: Diagnosis not present

## 2023-07-22 DIAGNOSIS — Z91041 Radiographic dye allergy status: Secondary | ICD-10-CM | POA: Diagnosis not present

## 2023-07-22 DIAGNOSIS — D509 Iron deficiency anemia, unspecified: Secondary | ICD-10-CM | POA: Insufficient documentation

## 2023-07-22 DIAGNOSIS — R002 Palpitations: Secondary | ICD-10-CM | POA: Diagnosis not present

## 2023-07-22 DIAGNOSIS — R200 Anesthesia of skin: Secondary | ICD-10-CM | POA: Diagnosis not present

## 2023-07-22 DIAGNOSIS — E538 Deficiency of other specified B group vitamins: Secondary | ICD-10-CM | POA: Insufficient documentation

## 2023-07-22 DIAGNOSIS — D75839 Thrombocytosis, unspecified: Secondary | ICD-10-CM | POA: Diagnosis not present

## 2023-07-22 DIAGNOSIS — Z888 Allergy status to other drugs, medicaments and biological substances status: Secondary | ICD-10-CM | POA: Diagnosis not present

## 2023-07-22 DIAGNOSIS — M549 Dorsalgia, unspecified: Secondary | ICD-10-CM | POA: Diagnosis not present

## 2023-07-22 DIAGNOSIS — K5900 Constipation, unspecified: Secondary | ICD-10-CM | POA: Diagnosis not present

## 2023-07-22 DIAGNOSIS — E785 Hyperlipidemia, unspecified: Secondary | ICD-10-CM | POA: Insufficient documentation

## 2023-07-22 DIAGNOSIS — Z833 Family history of diabetes mellitus: Secondary | ICD-10-CM | POA: Diagnosis not present

## 2023-07-22 DIAGNOSIS — I10 Essential (primary) hypertension: Secondary | ICD-10-CM | POA: Diagnosis not present

## 2023-07-22 DIAGNOSIS — R5383 Other fatigue: Secondary | ICD-10-CM | POA: Insufficient documentation

## 2023-07-22 DIAGNOSIS — Z811 Family history of alcohol abuse and dependence: Secondary | ICD-10-CM | POA: Insufficient documentation

## 2023-07-22 DIAGNOSIS — Z79899 Other long term (current) drug therapy: Secondary | ICD-10-CM | POA: Diagnosis not present

## 2023-07-22 DIAGNOSIS — R519 Headache, unspecified: Secondary | ICD-10-CM | POA: Insufficient documentation

## 2023-07-22 DIAGNOSIS — Z87891 Personal history of nicotine dependence: Secondary | ICD-10-CM | POA: Insufficient documentation

## 2023-07-22 DIAGNOSIS — G2581 Restless legs syndrome: Secondary | ICD-10-CM | POA: Diagnosis not present

## 2023-07-22 DIAGNOSIS — K219 Gastro-esophageal reflux disease without esophagitis: Secondary | ICD-10-CM | POA: Insufficient documentation

## 2023-07-22 DIAGNOSIS — Z818 Family history of other mental and behavioral disorders: Secondary | ICD-10-CM | POA: Insufficient documentation

## 2023-07-22 DIAGNOSIS — Z79890 Hormone replacement therapy (postmenopausal): Secondary | ICD-10-CM | POA: Diagnosis not present

## 2023-07-22 DIAGNOSIS — F419 Anxiety disorder, unspecified: Secondary | ICD-10-CM | POA: Insufficient documentation

## 2023-07-22 DIAGNOSIS — Z8249 Family history of ischemic heart disease and other diseases of the circulatory system: Secondary | ICD-10-CM | POA: Insufficient documentation

## 2023-07-22 DIAGNOSIS — Z90710 Acquired absence of both cervix and uterus: Secondary | ICD-10-CM | POA: Diagnosis not present

## 2023-07-22 LAB — CBC WITH DIFFERENTIAL/PLATELET
Abs Immature Granulocytes: 0.03 10*3/uL (ref 0.00–0.07)
Basophils Absolute: 0 10*3/uL (ref 0.0–0.1)
Basophils Relative: 1 %
Eosinophils Absolute: 0.1 10*3/uL (ref 0.0–0.5)
Eosinophils Relative: 1 %
HCT: 40.6 % (ref 36.0–46.0)
Hemoglobin: 13.5 g/dL (ref 12.0–15.0)
Immature Granulocytes: 0 %
Lymphocytes Relative: 32 %
Lymphs Abs: 2.4 10*3/uL (ref 0.7–4.0)
MCH: 29.1 pg (ref 26.0–34.0)
MCHC: 33.3 g/dL (ref 30.0–36.0)
MCV: 87.5 fL (ref 80.0–100.0)
Monocytes Absolute: 0.5 10*3/uL (ref 0.1–1.0)
Monocytes Relative: 7 %
Neutro Abs: 4.5 10*3/uL (ref 1.7–7.7)
Neutrophils Relative %: 59 %
Platelets: 438 10*3/uL — ABNORMAL HIGH (ref 150–400)
RBC: 4.64 MIL/uL (ref 3.87–5.11)
RDW: 13.6 % (ref 11.5–15.5)
WBC: 7.6 10*3/uL (ref 4.0–10.5)
nRBC: 0 % (ref 0.0–0.2)

## 2023-07-22 LAB — COMPREHENSIVE METABOLIC PANEL
ALT: 18 U/L (ref 0–44)
AST: 18 U/L (ref 15–41)
Albumin: 4.5 g/dL (ref 3.5–5.0)
Alkaline Phosphatase: 41 U/L (ref 38–126)
Anion gap: 10 (ref 5–15)
BUN: 14 mg/dL (ref 8–23)
CO2: 29 mmol/L (ref 22–32)
Calcium: 9.9 mg/dL (ref 8.9–10.3)
Chloride: 98 mmol/L (ref 98–111)
Creatinine, Ser: 0.89 mg/dL (ref 0.44–1.00)
GFR, Estimated: >60 mL/min (ref >60)
Glucose, Bld: 159 mg/dL — ABNORMAL HIGH (ref 70–99)
Potassium: 4 mmol/L (ref 3.5–5.1)
Sodium: 137 mmol/L (ref 135–145)
Total Bilirubin: 0.2 mg/dL (ref 0.0–1.2)
Total Protein: 7.4 g/dL (ref 6.5–8.1)

## 2023-07-22 LAB — IRON AND TIBC
Iron: 85 ug/dL (ref 28–170)
Saturation Ratios: 16 % (ref 10.4–31.8)
TIBC: 532 ug/dL — ABNORMAL HIGH (ref 250–450)
UIBC: 447 ug/dL

## 2023-07-22 LAB — VITAMIN B12: Vitamin B-12: 217 pg/mL (ref 180–914)

## 2023-07-22 LAB — FERRITIN: Ferritin: 23 ng/mL (ref 11–307)

## 2023-07-24 ENCOUNTER — Other Ambulatory Visit: Payer: Self-pay | Admitting: Family

## 2023-07-24 DIAGNOSIS — F331 Major depressive disorder, recurrent, moderate: Secondary | ICD-10-CM

## 2023-07-24 DIAGNOSIS — F411 Generalized anxiety disorder: Secondary | ICD-10-CM

## 2023-07-24 DIAGNOSIS — G2581 Restless legs syndrome: Secondary | ICD-10-CM

## 2023-07-25 LAB — METHYLMALONIC ACID, SERUM: Methylmalonic Acid, Quantitative: 190 nmol/L (ref 0–378)

## 2023-07-28 NOTE — Progress Notes (Signed)
 [facility] [address] [place], [place] [address]   CLINIC:  Medical Oncology/Hematology  PCP:  Lavell Bari LABOR, FNP [address] [place] [address] [phone]   REASON FOR VISIT:  Follow-up for iron deficiency anemia and thrombocytosis   PRIOR THERAPY: Oral iron supplementation   CURRENT THERAPY: IV iron (Feraheme  x2 on 05/04/2021 and 05/12/2021)  INTERVAL HISTORY:   Brittany Lynch 63 y.o. female returns for routine follow-up of iron deficiency anemia and thrombocytosis.  She was last seen by Pleasant Barefoot PA-C on 07/27/2022.  At today's visit, she reports feeling fair.  She does report some decreased energy over the past few months, which had previously improved after IV iron.  She reports some chronic cravings for ice.  Restless leg symptoms have improved on pramipexole .  She denies any lightheadedness, dyspnea on exertion, or chest pain.  She reports occasional scant rectal bleeding related to constipation, but denies any gross hematochezia or melena.  She reports some general itchiness and occasional aquagenic pruritus.  She denies any other vasomotor symptoms or erythromelalgia.  No DVT or PE.  She continues to take daily vitamin B12 supplement.   She has 10% energy and 100% appetite. She endorses that she is maintaining a stable weight.  ASSESSMENT & PLAN:  1.   Thrombocytosis - Referred to hematology for initial consult with Dr. Loretha on 12/09/2020. - No previous history of blood clots, no current signs or symptoms of DVT or PE   - Review of past labs shows intermittent thrombocytosis since January 2020, with maximum platelets 536 - MPN work-up negative for JAK2, CALR, or MPL mutation.  (BCR/ABL not tested) - Other work-up revealed iron deficiency with ferritin 12 and iron saturation 9% - IV iron repletion with Feraheme  in October 2022 - Platelets returned to normal after iron repletion, with platelets 366 (06/27/2021) - Most recent labs  (07/22/2023): Platelets 438 (in the setting of recurrent iron deficiency) - Differential diagnosis favors reactive thrombocytosis secondary to iron deficiency. - PLAN: No indication for further testing/MPN work-up at this time (i.e. BCR/ABL testing). - Iron repletion as below, with continued annual follow-up or sooner if needed.   2.  Iron deficiency state without anemia - Initial work-up (12/09/2020): Iron deficiency with ferritin 12, iron saturation 9% - Colonoscopy (02/06/2023): Mild diverticulosis.  Residual stool despite aggressive prep.  Gastroenterologist felt they were confidently able to rule out lesions >5 mm, but are planning for repeat colonoscopy in 5 years. - Patient was started on oral iron supplementation (ferrous sulfate ) at her last visit (01/06/2021), which caused some constipation - Repeat iron panel (04/25/2021): Persistent iron deficiency with ferritin 22, iron saturation 13%, elevated TIBC 572 - Patient received Feraheme  x2 on 05/04/2021 and 05/12/2021 (steroid premedication given due to multiple medication allergies) - Patient denies any signs or symptoms of blood loss such as epistaxis, hematemesis, hematochezia, or melena. - Most recent labs (07/22/2023): Hgb 13.5/MCV 87.5, ferritin 23, iron saturation 16% with elevated TIBC 532 - Suspect malabsorption, possibly due to daily PPI use  - PLAN: Recommend IV iron with Feraheme  x 2 with PREMEDICATION.   Repeat CBC/iron panel and RTC in 1 year   3.  Borderline B12 deficiency - B12 checked on 06/27/2021 was borderline at 293, with normal methylmalonic acid - She is taking OTC vitamin B12 supplement daily, but without improvement in B12 levels - She reports family history of grandmother not absorbing B12 -Labs from 07/22/2023 show Vitamin B12 marginal at 217, normal MMA  190.  - PLAN: We will switch to vitamin B12 injections.  First dose to be given in clinic today.  Prescription sent to pharmacy for monthly B12 injections to be given  at home by patient's daughter. - Recheck B12/MMA in 1 year   PLAN SUMMARY: >> Vitamin B12 injection x 1 to be given TODAY + Rx to pharmacy for monthly B12 injections administered at home >> IV Feraheme  x 2 with PREMEDS >> Labs in 1 year (CBC/D, ferritin, iron/TIBC, B12, MMA) >> Office visit in 1 year, 1 week after labs      REVIEW OF SYSTEMS:   Review of Systems  Constitutional:  Positive for fatigue. Negative for appetite change, chills, diaphoresis, fever and unexpected weight change.  HENT:   Positive for trouble swallowing. Negative for lump/mass and nosebleeds.   Eyes:  Negative for eye problems.  Respiratory:  Negative for cough, hemoptysis and shortness of breath.   Cardiovascular:  Positive for palpitations. Negative for chest pain and leg swelling.  Gastrointestinal:  Negative for abdominal pain, blood in stool, constipation, diarrhea, nausea and vomiting.  Genitourinary:  Negative for hematuria.   Musculoskeletal:  Positive for back pain.  Skin: Negative.   Neurological:  Positive for headaches and numbness. Negative for dizziness and light-headedness.  Hematological:  Does not bruise/bleed easily.     PHYSICAL EXAM:  ECOG PERFORMANCE STATUS: 1 - Symptomatic but completely ambulatory  Vitals:   07/29/23 1011  BP: (!) 149/83  Pulse: 65  Resp: 18  Temp: (!) 96.9 F (36.1 C)  SpO2: 100%   Filed Weights   07/29/23 1011  Weight: 160 lb 15 oz (73 kg)   Physical Exam Constitutional:      Appearance: Normal appearance. She is normal weight.  Cardiovascular:     Heart sounds: Normal heart sounds.  Pulmonary:     Breath sounds: Normal breath sounds.  Neurological:     General: No focal deficit present.     Mental Status: Mental status is at baseline.  Psychiatric:        Behavior: Behavior normal. Behavior is cooperative.     PAST MEDICAL/SURGICAL HISTORY:  Past Medical History:  Diagnosis Date   Anemia    Anxiety    Arthritis    spine/neck   Depression     GERD (gastroesophageal reflux disease)    Headache    Hyperlipidemia    Hypertension    Hypothyroidism    Iron deficiency 04/28/2021   Thyroid  disease    Past Surgical History:  Procedure Laterality Date   ABDOMINAL HYSTERECTOMY     ANTERIOR CERVICAL DECOMP/DISCECTOMY FUSION N/A 02/08/2021   Procedure: CERVICAL FOUR-FIVE ANTERIOR CERVICAL DECOMPRESSION/DISCECTOMY FUSION;  Surgeon: Joshua Alm RAMAN, MD;  Location: [facility] OR;  Service: Neurosurgery;  Laterality: N/A;  3C   BACK SURGERY     CATARACT EXTRACTION Bilateral    EYE SURGERY     LUMBAR LAMINECTOMY/DECOMPRESSION MICRODISCECTOMY Bilateral 03/06/2018   Procedure: Laminectomy and Foraminotomy - Lumbar Two-Three, Three-Four bilateral;  Surgeon: Joshua Alm RAMAN, MD;  Location: [facility] OR;  Service: Neurosurgery;  Laterality: Bilateral;  Laminectomy and Foraminotomy - Lumbar Two-Three, Three-Four bilateral   NECK SURGERY     Right hand and wrist surgery Right    Laceration    SPINE SURGERY      SOCIAL HISTORY:  Social History   Socioeconomic History   Marital status: Legally Separated    Spouse name: Not on file   Number of children: 3   Years of education: Not  on file   Highest education level: 9th grade  Occupational History   Occupation: Retired  Tobacco Use   Smoking status: Former    Current packs/day: 0.00    Types: Cigarettes    Quit date: 07/16/2001    Years since quitting: 22.0   Smokeless tobacco: Never  Vaping Use   Vaping status: Never Used  Substance and Sexual Activity   Alcohol use: No    Comment: hx alcoholism. none since age 45   Drug use: Yes    Comment: once every 2 weeks [facility])   Sexual activity: Yes  Other Topics Concern   Not on file  Social History Narrative   Lives in apartment basement of daughters home    Social Drivers of Health   Financial Resource Strain: Low Risk  (09/03/2022)   Overall Financial Resource Strain (CARDIA)    Difficulty of Paying Living Expenses: Not hard at all  Food  Insecurity: No Food Insecurity (09/03/2022)   Hunger Vital Sign    Worried About Running Out of Food in the Last Year: Never true    Ran Out of Food in the Last Year: Never true  Transportation Needs: No Transportation Needs (09/03/2022)   PRAPARE - Administrator, Civil Service (Medical): No    Lack of Transportation (Non-Medical): No  Physical Activity: Insufficiently Active (09/03/2022)   Exercise Vital Sign    Days of Exercise per Week: 3 days    Minutes of Exercise per Session: 30 min  Stress: No Stress Concern Present (09/03/2022)   [employer] of Occupational Health - Occupational Stress Questionnaire    Feeling of Stress : Not at all  Social Connections: Unknown (09/10/2022)   Received from [facility], [facility]   Social Network    Social Network: Not on file  Intimate Partner Violence: Unknown (09/10/2022)   Received from [facility] [place], [facility]   HITS    Physically Hurt: Not on file    Insult or Talk Down To: Not on file    Threaten Physical Harm: Not on file    Scream or Curse: Not on file    FAMILY HISTORY:  Family History  Problem Relation Age of Onset   Mental illness Mother    Alcohol abuse Father    Diabetes Father    Heart disease Father    Breast cancer Neg Hx     CURRENT MEDICATIONS:  Outpatient Encounter Medications as of 07/29/2023  Medication Sig   atorvastatin  (LIPITOR) 20 MG tablet Take 1 tablet (20 mg total) by mouth daily.   buPROPion  (WELLBUTRIN  XL) 300 MG 24 hr tablet Take 1 tablet (300 mg total) by mouth daily.   chlorhexidine  (PERIDEX ) 0.12 % solution 15 mLs 2 (two) times daily.   desvenlafaxine  (PRISTIQ ) 100 MG 24 hr tablet Take 1 tablet (100 mg total) by mouth daily.   diazepam (VALIUM) 5 MG tablet When she has back inj   diphenhydrAMINE (BENADRYL) 25 mg capsule Take 25 mg by mouth every 6 (six) hours as needed for itching (after neck injections).   estradiol  (ESTRACE ) 2 MG tablet Take 1 tablet (2 mg total) by mouth  daily.   fenofibrate  (TRICOR ) 145 MG tablet Take 1 tablet (145 mg total) by mouth daily.   ferrous sulfate  324 MG TBEC Take 324 mg by mouth.   ibuprofen  (ADVIL ) 800 MG tablet Take 800 mg by mouth every 8 (eight) hours as needed.   levothyroxine  (SYNTHROID ) 100 MCG tablet Take 1 tablet (100 mcg  total) by mouth daily.   LINZESS  145 MCG CAPS capsule Take 1 capsule (145 mcg total) by mouth daily before breakfast.   Magnesium Oxide -Mg Supplement ([employer] MAGNESIUM) 500 MG TABS Take by mouth.   metoprolol  tartrate (LOPRESSOR ) 50 MG tablet Take 1 tablet (50 mg total) by mouth 2 (two) times daily.   omeprazole  (PRILOSEC) 40 MG capsule TAKE ONE CAPSULE BY MOUTH EVERY DAY   Oxycodone  HCl 10 MG TABS Take 10 mg by mouth every 6 (six) hours as needed for severe pain.   pramipexole  (MIRAPEX ) 0.5 MG tablet TAKE TWO TABLETS BY MOUTH EVERY EVENING   Vitamin D , Ergocalciferol , (DRISDOL ) 1.25 MG (50000 UNIT) CAPS capsule Take 1 capsule (50,000 Units total) by mouth every 7 (seven) days.   No facility-administered encounter medications on file as of 07/29/2023.    ALLERGIES:  Allergies  Allergen Reactions   Chocolate Shortness Of Breath and Rash   Iodinated Contrast Media Anaphylaxis, Shortness Of Breath and Rash    Can tolerate with Benadryl injection    Methotrexate Derivatives Shortness Of Breath and Rash   Lisinopril  Cough   Tape Rash and Other (See Comments)    Paper tape only.    LABORATORY DATA:  I have reviewed the labs as listed.  CBC    Component Value Date/Time   WBC 7.6 07/22/2023 1018   RBC 4.64 07/22/2023 1018   HGB 13.5 07/22/2023 1018   HGB 13.1 10/29/2022 1040   HCT 40.6 07/22/2023 1018   HCT 41.5 10/29/2022 1040   PLT 438 (H) 07/22/2023 1018   PLT 430 10/29/2022 1040   MCV 87.5 07/22/2023 1018   MCV 87 10/29/2022 1040   MCH 29.1 07/22/2023 1018   MCHC 33.3 07/22/2023 1018   RDW 13.6 07/22/2023 1018   RDW 12.5 10/29/2022 1040   LYMPHSABS 2.4 07/22/2023 1018   LYMPHSABS 1.9  10/29/2022 1040   MONOABS 0.5 07/22/2023 1018   EOSABS 0.1 07/22/2023 1018   EOSABS 0.1 10/29/2022 1040   BASOSABS 0.0 07/22/2023 1018   BASOSABS 0.1 10/29/2022 1040      Latest Ref Rng & Units 07/22/2023   10:18 AM 01/29/2023    2:52 PM 10/29/2022   10:40 AM  CMP  Glucose 70 - 99 mg/dL 840  86  91   BUN 8 - 23 mg/dL 14  15  10    Creatinine 0.44 - 1.00 mg/dL 9.10  9.10  9.03   Sodium 135 - 145 mmol/L 137  139  143   Potassium 3.5 - 5.1 mmol/L 4.0  4.9  4.9   Chloride 98 - 111 mmol/L 98  100  103   CO2 22 - 32 mmol/L 29  25  24    Calcium  8.9 - 10.3 mg/dL 9.9  9.9  89.6   Total Protein 6.5 - 8.1 g/dL 7.4  7.0  7.0   Total Bilirubin 0.0 - 1.2 mg/dL 0.2  <9.7  0.3   Alkaline Phos 38 - 126 U/L 41  44  45   AST 15 - 41 U/L 18  16  25    ALT 0 - 44 U/L 18  18  29      DIAGNOSTIC IMAGING:  I have independently reviewed the relevant imaging and discussed with the patient.   WRAP UP:  All questions were answered. The patient knows to call the clinic with any problems, questions or concerns.  Medical decision making: Moderate  Time spent on visit: I spent 20 minutes counseling the patient face to face.  The total time spent in the appointment was 30 minutes and more than 50% was on counseling.  Pleasant CHRISTELLA Barefoot, PA-C  07/29/23 9:44 PM

## 2023-07-29 ENCOUNTER — Encounter: Payer: Self-pay | Admitting: *Deleted

## 2023-07-29 ENCOUNTER — Ambulatory Visit: Payer: Medicare Other | Admitting: Physician Assistant

## 2023-07-29 ENCOUNTER — Inpatient Hospital Stay (HOSPITAL_BASED_OUTPATIENT_CLINIC_OR_DEPARTMENT_OTHER): Payer: 59 | Admitting: Physician Assistant

## 2023-07-29 ENCOUNTER — Inpatient Hospital Stay: Payer: 59

## 2023-07-29 VITALS — BP 149/83 | HR 65 | Temp 96.9°F | Resp 18 | Wt 160.9 lb

## 2023-07-29 DIAGNOSIS — D75839 Thrombocytosis, unspecified: Secondary | ICD-10-CM | POA: Diagnosis not present

## 2023-07-29 DIAGNOSIS — R519 Headache, unspecified: Secondary | ICD-10-CM | POA: Diagnosis not present

## 2023-07-29 DIAGNOSIS — K5900 Constipation, unspecified: Secondary | ICD-10-CM | POA: Diagnosis not present

## 2023-07-29 DIAGNOSIS — R002 Palpitations: Secondary | ICD-10-CM | POA: Diagnosis not present

## 2023-07-29 DIAGNOSIS — Z888 Allergy status to other drugs, medicaments and biological substances status: Secondary | ICD-10-CM | POA: Diagnosis not present

## 2023-07-29 DIAGNOSIS — E538 Deficiency of other specified B group vitamins: Secondary | ICD-10-CM | POA: Diagnosis not present

## 2023-07-29 DIAGNOSIS — G2581 Restless legs syndrome: Secondary | ICD-10-CM | POA: Diagnosis not present

## 2023-07-29 DIAGNOSIS — R5383 Other fatigue: Secondary | ICD-10-CM | POA: Diagnosis not present

## 2023-07-29 DIAGNOSIS — Z91041 Radiographic dye allergy status: Secondary | ICD-10-CM | POA: Diagnosis not present

## 2023-07-29 DIAGNOSIS — R200 Anesthesia of skin: Secondary | ICD-10-CM | POA: Diagnosis not present

## 2023-07-29 DIAGNOSIS — D509 Iron deficiency anemia, unspecified: Secondary | ICD-10-CM

## 2023-07-29 DIAGNOSIS — Z79899 Other long term (current) drug therapy: Secondary | ICD-10-CM | POA: Diagnosis not present

## 2023-07-29 DIAGNOSIS — Z8249 Family history of ischemic heart disease and other diseases of the circulatory system: Secondary | ICD-10-CM | POA: Diagnosis not present

## 2023-07-29 DIAGNOSIS — Z87891 Personal history of nicotine dependence: Secondary | ICD-10-CM | POA: Diagnosis not present

## 2023-07-29 DIAGNOSIS — M549 Dorsalgia, unspecified: Secondary | ICD-10-CM | POA: Diagnosis not present

## 2023-07-29 DIAGNOSIS — E039 Hypothyroidism, unspecified: Secondary | ICD-10-CM | POA: Diagnosis not present

## 2023-07-29 DIAGNOSIS — E785 Hyperlipidemia, unspecified: Secondary | ICD-10-CM | POA: Diagnosis not present

## 2023-07-29 DIAGNOSIS — Z833 Family history of diabetes mellitus: Secondary | ICD-10-CM | POA: Diagnosis not present

## 2023-07-29 DIAGNOSIS — I10 Essential (primary) hypertension: Secondary | ICD-10-CM | POA: Diagnosis not present

## 2023-07-29 DIAGNOSIS — K219 Gastro-esophageal reflux disease without esophagitis: Secondary | ICD-10-CM | POA: Diagnosis not present

## 2023-07-29 MED ORDER — CYANOCOBALAMIN 1000 MCG/ML IJ SOLN
1000.0000 ug | Freq: Once | INTRAMUSCULAR | Status: AC
Start: 2023-07-29 — End: 2023-07-29
  Administered 2023-07-29: 1000 ug via INTRAMUSCULAR
  Filled 2023-07-29: qty 1

## 2023-07-29 MED ORDER — NEEDLES & SYRINGES MISC: 1.0000 | 0 refills | Status: DC

## 2023-07-29 MED ORDER — CYANOCOBALAMIN 1000 MCG/ML IJ SOLN: 1000.0000 ug | INTRAMUSCULAR | 1 refills | Status: DC

## 2023-07-29 NOTE — Patient Instructions (Addendum)
 [facility] **VISIT SUMMARY & IMPORTANT INSTRUCTIONS **   You were seen today by Pleasant Barefoot PA-C for your follow-up visit.    IRON DEFICIENCY Your iron levels are low. Will schedule you for IV iron x 2 doses. Your platelet levels have previously improved after being given IV iron.  VITAMIN B12 DEFICIENCY Your vitamin B12 levels are still low, even though you have been taking daily B12 supplement. We will give you vitamin B12 injection x 1 dose today. We will send a prescription to your pharmacy for vitamin B12 injections.  These can be given at home by you or a family member every 4 weeks. You can STOP taking your daily B12 supplement since it has not helped. **If you are not able to get monthly B12 injections at your primary doctor's office, please let us  know so that we can assist with other options!  FOLLOW-UP APPOINTMENT: Labs and office visit in 1 year  ** Thank you for trusting me with your healthcare!  I strive to provide all of my patients with quality care at each visit.  If you receive a survey for this visit, I would be so grateful to you for taking the time to provide feedback.  Thank you in advance!  ~ Delisha Peaden                   Dr. Alean Stands   &   Pleasant Barefoot, PA-C   - - - - - - - - - - - - - - - - - -    Thank you for choosing  Can[facility] Center at [facility] to provide your oncology and hematology care.  To afford each patient quality time with our provider, please arrive at least 15 minutes before your scheduled appointment time.   If you have a lab appointment with the Cancer Center please come in thru the Main Entrance and check in at the main information desk.  You need to re-schedule your appointment should you arrive 10 or more minutes late.  We strive to give you quality time with our providers, and arriving late affects you and other patients whose appointments are after yours.  Also, if  you no show three or more times for appointments you may be dismissed from the clinic at the providers discretion.     Again, thank you for choosing [facility].  Our hope is that these requests will decrease the amount of time that you wait before being seen by our physicians.       _____________________________________________________________  Should you have questions after your visit to [facility], please contact our office at [phone] and follow the prompts.  Our office hours are 8:00 a.m. and 4:30 p.m. Monday - Friday.  Please note that voicemails left after 4:00 p.m. may not be returned until the following business day.  We are closed weekends and major holidays.  You do have access to a nurse 24-7, just call the main number to the clinic [phone] and do not press any options, hold on the line and a nurse will answer the phone.    For prescription refill requests, have your pharmacy contact our office and allow 72 hours.

## 2023-07-31 ENCOUNTER — Inpatient Hospital Stay: Payer: 59

## 2023-07-31 VITALS — BP 144/79 | HR 58 | Temp 97.2°F | Resp 18

## 2023-07-31 DIAGNOSIS — E538 Deficiency of other specified B group vitamins: Secondary | ICD-10-CM | POA: Diagnosis not present

## 2023-07-31 DIAGNOSIS — E039 Hypothyroidism, unspecified: Secondary | ICD-10-CM | POA: Diagnosis not present

## 2023-07-31 DIAGNOSIS — G2581 Restless legs syndrome: Secondary | ICD-10-CM | POA: Diagnosis not present

## 2023-07-31 DIAGNOSIS — R200 Anesthesia of skin: Secondary | ICD-10-CM | POA: Diagnosis not present

## 2023-07-31 DIAGNOSIS — Z87891 Personal history of nicotine dependence: Secondary | ICD-10-CM | POA: Diagnosis not present

## 2023-07-31 DIAGNOSIS — I10 Essential (primary) hypertension: Secondary | ICD-10-CM | POA: Diagnosis not present

## 2023-07-31 DIAGNOSIS — D75839 Thrombocytosis, unspecified: Secondary | ICD-10-CM | POA: Diagnosis not present

## 2023-07-31 DIAGNOSIS — R5383 Other fatigue: Secondary | ICD-10-CM | POA: Diagnosis not present

## 2023-07-31 DIAGNOSIS — Z888 Allergy status to other drugs, medicaments and biological substances status: Secondary | ICD-10-CM | POA: Diagnosis not present

## 2023-07-31 DIAGNOSIS — Z91041 Radiographic dye allergy status: Secondary | ICD-10-CM | POA: Diagnosis not present

## 2023-07-31 DIAGNOSIS — Z8249 Family history of ischemic heart disease and other diseases of the circulatory system: Secondary | ICD-10-CM | POA: Diagnosis not present

## 2023-07-31 DIAGNOSIS — K5900 Constipation, unspecified: Secondary | ICD-10-CM | POA: Diagnosis not present

## 2023-07-31 DIAGNOSIS — E611 Iron deficiency: Secondary | ICD-10-CM

## 2023-07-31 DIAGNOSIS — Z833 Family history of diabetes mellitus: Secondary | ICD-10-CM | POA: Diagnosis not present

## 2023-07-31 DIAGNOSIS — M549 Dorsalgia, unspecified: Secondary | ICD-10-CM | POA: Diagnosis not present

## 2023-07-31 DIAGNOSIS — Z79899 Other long term (current) drug therapy: Secondary | ICD-10-CM | POA: Diagnosis not present

## 2023-07-31 DIAGNOSIS — E785 Hyperlipidemia, unspecified: Secondary | ICD-10-CM | POA: Diagnosis not present

## 2023-07-31 DIAGNOSIS — R519 Headache, unspecified: Secondary | ICD-10-CM | POA: Diagnosis not present

## 2023-07-31 DIAGNOSIS — D509 Iron deficiency anemia, unspecified: Secondary | ICD-10-CM | POA: Diagnosis not present

## 2023-07-31 DIAGNOSIS — K219 Gastro-esophageal reflux disease without esophagitis: Secondary | ICD-10-CM | POA: Diagnosis not present

## 2023-07-31 DIAGNOSIS — R002 Palpitations: Secondary | ICD-10-CM | POA: Diagnosis not present

## 2023-07-31 MED ORDER — ACETAMINOPHEN 325 MG PO TABS
650.0000 mg | ORAL_TABLET | Freq: Once | ORAL | Status: AC
  Administered 2023-07-31: 650 mg via ORAL
  Filled 2023-07-31: qty 2

## 2023-07-31 MED ORDER — SODIUM CHLORIDE 0.9 % IV SOLN: INTRAVENOUS | Status: DC

## 2023-07-31 MED ORDER — FAMOTIDINE IN NACL 20-0.9 MG/50ML-% IV SOLN
20.0000 mg | Freq: Once | INTRAVENOUS | Status: AC
  Administered 2023-07-31: 20 mg via INTRAVENOUS
  Filled 2023-07-31: qty 50

## 2023-07-31 MED ORDER — SODIUM CHLORIDE 0.9 % IV SOLN
510.0000 mg | Freq: Once | INTRAVENOUS | Status: AC
  Administered 2023-07-31: 510 mg via INTRAVENOUS
  Filled 2023-07-31: qty 510

## 2023-07-31 MED ORDER — CETIRIZINE HCL 10 MG PO TABS
10.0000 mg | ORAL_TABLET | Freq: Once | ORAL | Status: AC
Start: 2023-07-31 — End: 2023-07-31
  Administered 2023-07-31: 10 mg via ORAL
  Filled 2023-07-31: qty 1

## 2023-07-31 MED ORDER — METHYLPREDNISOLONE SODIUM SUCC 125 MG IJ SOLR
125.0000 mg | Freq: Once | INTRAMUSCULAR | Status: AC
  Administered 2023-07-31: 125 mg via INTRAVENOUS
  Filled 2023-07-31: qty 2

## 2023-07-31 NOTE — Patient Instructions (Signed)

## 2023-07-31 NOTE — Progress Notes (Signed)
 Feraheme iron infusion given per orders. Patient tolerated it well without problems. Vitals stable and discharged home from clinic ambulatory. Follow up as scheduled.

## 2023-08-01 ENCOUNTER — Encounter: Payer: Self-pay | Admitting: Family

## 2023-08-01 ENCOUNTER — Ambulatory Visit: Payer: 59 | Admitting: Family

## 2023-08-01 VITALS — BP 163/74 | HR 64 | Temp 97.3°F | Ht 61.0 in | Wt 166.0 lb

## 2023-08-01 DIAGNOSIS — K219 Gastro-esophageal reflux disease without esophagitis: Secondary | ICD-10-CM

## 2023-08-01 DIAGNOSIS — M2550 Pain in unspecified joint: Secondary | ICD-10-CM

## 2023-08-01 DIAGNOSIS — E785 Hyperlipidemia, unspecified: Secondary | ICD-10-CM | POA: Diagnosis not present

## 2023-08-01 DIAGNOSIS — E039 Hypothyroidism, unspecified: Secondary | ICD-10-CM | POA: Diagnosis not present

## 2023-08-01 DIAGNOSIS — E611 Iron deficiency: Secondary | ICD-10-CM

## 2023-08-01 DIAGNOSIS — Z23 Encounter for immunization: Secondary | ICD-10-CM | POA: Diagnosis not present

## 2023-08-01 DIAGNOSIS — F411 Generalized anxiety disorder: Secondary | ICD-10-CM | POA: Diagnosis not present

## 2023-08-01 DIAGNOSIS — I10 Essential (primary) hypertension: Secondary | ICD-10-CM

## 2023-08-01 DIAGNOSIS — M545 Low back pain, unspecified: Secondary | ICD-10-CM | POA: Diagnosis not present

## 2023-08-01 DIAGNOSIS — F331 Major depressive disorder, recurrent, moderate: Secondary | ICD-10-CM

## 2023-08-01 DIAGNOSIS — K5900 Constipation, unspecified: Secondary | ICD-10-CM

## 2023-08-01 DIAGNOSIS — N951 Menopausal and female climacteric states: Secondary | ICD-10-CM

## 2023-08-01 DIAGNOSIS — G2581 Restless legs syndrome: Secondary | ICD-10-CM

## 2023-08-01 DIAGNOSIS — M48061 Spinal stenosis, lumbar region without neurogenic claudication: Secondary | ICD-10-CM

## 2023-08-01 DIAGNOSIS — G8929 Other chronic pain: Secondary | ICD-10-CM

## 2023-08-01 MED ORDER — DESVENLAFAXINE SUCCINATE ER 100 MG PO TB24: 100.0000 mg | ORAL_TABLET | Freq: Every day | ORAL | 0 refills | Status: DC

## 2023-08-01 MED ORDER — ATORVASTATIN CALCIUM 20 MG PO TABS: 20.0000 mg | ORAL_TABLET | Freq: Every day | ORAL | 1 refills | Status: DC

## 2023-08-01 MED ORDER — PRAMIPEXOLE DIHYDROCHLORIDE 0.5 MG PO TABS: 1.0000 mg | ORAL_TABLET | Freq: Every evening | ORAL | 0 refills | Status: DC

## 2023-08-01 MED ORDER — ESTRADIOL 2 MG PO TABS: 2.0000 mg | ORAL_TABLET | Freq: Every day | ORAL | 3 refills | Status: DC

## 2023-08-01 MED ORDER — METOPROLOL TARTRATE 50 MG PO TABS: 50.0000 mg | ORAL_TABLET | Freq: Two times a day (BID) | ORAL | 1 refills | Status: DC

## 2023-08-01 MED ORDER — FENOFIBRATE 145 MG PO TABS: 145.0000 mg | ORAL_TABLET | Freq: Every day | ORAL | 1 refills | Status: DC

## 2023-08-01 MED ORDER — BUPROPION HCL ER (XL) 300 MG PO TB24: 300.0000 mg | ORAL_TABLET | Freq: Every day | ORAL | 0 refills | Status: DC

## 2023-08-01 MED ORDER — OMEPRAZOLE 40 MG PO CPDR: 40.0000 mg | DELAYED_RELEASE_CAPSULE | Freq: Every day | ORAL | 1 refills | Status: DC

## 2023-08-01 NOTE — Patient Instructions (Signed)

## 2023-08-01 NOTE — Progress Notes (Signed)
Subjective:    Patient ID: Brittany Lynch, female    DOB: 1960-11-24, 63 y.o.   MRN: [id number]  Chief Complaint  Patient presents with   Medical Management of Chronic Issues    Started b12 injections.    Pt presents to the office today for chronic follow up.    She is followed by Neurosurgeon and pain management every month and has hx of lumbar laminectomy and hx of cervical spinal fusion.   She is followed by Hematologists every 6 months for iron deficiency and thrombocytosis.  She has RLS and takes mirapex 1 mg that helps.  Hypertension This is a chronic problem. The current episode started more than 1 year ago. The problem has been resolved since onset. The problem is controlled. Associated symptoms include anxiety and malaise/fatigue. Pertinent negatives include no peripheral edema or shortness of breath. Risk factors for coronary artery disease include dyslipidemia, obesity and sedentary lifestyle. The current treatment provides moderate improvement. Identifiable causes of hypertension include a thyroid problem.  Gastroesophageal Reflux She complains of belching and heartburn. She reports no hoarse voice. This is a chronic problem. The current episode started more than 1 year ago. The problem occurs occasionally. The symptoms are aggravated by certain foods. Associated symptoms include fatigue. She has tried an antacid and a PPI for the symptoms. The treatment provided moderate relief.  Thyroid Problem Presents for follow-up visit. Symptoms include anxiety, constipation and fatigue. Patient reports no hoarse voice. The symptoms have been stable. Her past medical history is significant for hyperlipidemia.  Back Pain This is a chronic problem. The current episode started more than 1 year ago. The problem occurs intermittently. The problem has been waxing and waning since onset. The pain is present in the lumbar spine. The pain is at a severity of 3/10. The pain is moderate. She has  tried analgesics for the symptoms. The treatment provided moderate relief.  Anxiety Presents for follow-up visit. Symptoms include excessive worry and nervous/anxious behavior. Patient reports no shortness of breath. Symptoms occur most days. The severity of symptoms is mild.    Hyperlipidemia This is a chronic problem. The current episode started more than 1 year ago. The problem is uncontrolled. Recent lipid tests were reviewed and are high. Exacerbating diseases include obesity. Pertinent negatives include no shortness of breath. Current antihyperlipidemic treatment includes statins. The current treatment provides mild improvement of lipids. Risk factors for coronary artery disease include dyslipidemia, hypertension and a sedentary lifestyle.  Depression        This is a chronic problem.  The current episode started more than 1 year ago.   The problem occurs intermittently.  Associated symptoms include fatigue.  Associated symptoms include no helplessness, no hopelessness and not sad.  Past treatments include SNRIs - Serotonin and norepinephrine reuptake inhibitors.  Past medical history includes thyroid problem and anxiety.   Constipation This is a chronic problem. The current episode started more than 1 year ago. The problem has been resolved since onset. Her stool frequency is 1 time per day. Associated symptoms include back pain. She has tried laxatives for the symptoms. The treatment provided moderate relief.  Arthritis Presents for follow-up visit. She complains of pain and stiffness. She reports no joint swelling. The symptoms have been worsening. Affected locations include the left MCP, right MCP, left shoulder, right shoulder, left elbow, right elbow, left knee and right knee. Her pain is at a severity of 8/10. Associated symptoms include fatigue.      Review  of Systems  Constitutional:  Positive for fatigue and malaise/fatigue.  HENT:  Negative for hoarse voice.   Respiratory:   Negative for shortness of breath.   Gastrointestinal:  Positive for constipation and heartburn.  Musculoskeletal:  Positive for arthritis, back pain and stiffness. Negative for joint swelling.  Psychiatric/Behavioral:  Positive for depression. The patient is nervous/anxious.   All other systems reviewed and are negative.      Objective:   Physical Exam Vitals reviewed.  Constitutional:      General: She is not in acute distress.    Appearance: She is well-developed. She is obese.  HENT:     Head: Normocephalic and atraumatic.     Right Ear: Tympanic membrane normal.     Left Ear: Tympanic membrane normal.  Eyes:     Pupils: Pupils are equal, round, and reactive to light.  Neck:     Thyroid: No thyromegaly.  Cardiovascular:     Rate and Rhythm: Normal rate and regular rhythm.     Heart sounds: Normal heart sounds. No murmur heard. Pulmonary:     Effort: Pulmonary effort is normal. No respiratory distress.     Breath sounds: Normal breath sounds. No wheezing.  Abdominal:     General: Bowel sounds are normal. There is no distension.     Palpations: Abdomen is soft.     Tenderness: There is no abdominal tenderness.  Musculoskeletal:        General: No tenderness. Normal range of motion.     Cervical back: Normal range of motion and neck supple.  Skin:    General: Skin is warm and dry.  Neurological:     Mental Status: She is alert and oriented to person, place, and time.     Cranial Nerves: No cranial nerve deficit.     Deep Tendon Reflexes: Reflexes are normal and symmetric.  Psychiatric:        Mood and Affect: Mood is anxious.        Behavior: Behavior normal.        Thought Content: Thought content normal.        Judgment: Judgment normal.         BP (!) 163/74   Pulse 64   Temp (!) 97.3 F (36.3 C) (Temporal)   Ht 5\' 1"  (1.549 m)   Wt 166 lb (75.3 kg)   SpO2 98%   BMI 31.37 kg/m   Assessment & Plan:  Brittany Lynch comes in today with chief complaint of  Medical Management of Chronic Issues (Started b12 injections. )   Diagnosis and orders addressed:  1. Hyperlipidemia, unspecified hyperlipidemia type - atorvastatin (LIPITOR) 20 MG tablet; Take 1 tablet (20 mg total) by mouth daily.  Dispense: 90 tablet; Refill: 1 - fenofibrate (TRICOR) 145 MG tablet; Take 1 tablet (145 mg total) by mouth daily.  Dispense: 90 tablet; Refill: 1  2. GAD (generalized anxiety disorder) - buPROPion (WELLBUTRIN XL) 300 MG 24 hr tablet; Take 1 tablet (300 mg total) by mouth daily.  Dispense: 90 tablet; Refill: 0 - desvenlafaxine (PRISTIQ) 100 MG 24 hr tablet; Take 1 tablet (100 mg total) by mouth daily.  Dispense: 90 tablet; Refill: 0  3. Moderate episode of recurrent major depressive disorder (HCC) - buPROPion (WELLBUTRIN XL) 300 MG 24 hr tablet; Take 1 tablet (300 mg total) by mouth daily.  Dispense: 90 tablet; Refill: 0 - desvenlafaxine (PRISTIQ) 100 MG 24 hr tablet; Take 1 tablet (100 mg total) by mouth daily.  Dispense:  90 tablet; Refill: 0  4. Hot flash, menopausal - estradiol (ESTRACE) 2 MG tablet; Take 1 tablet (2 mg total) by mouth daily.  Dispense: 90 tablet; Refill: 3  5. Essential hypertension  - metoprolol tartrate (LOPRESSOR) 50 MG tablet; Take 1 tablet (50 mg total) by mouth 2 (two) times daily.  Dispense: 180 tablet; Refill: 1  6. RLS (restless legs syndrome) - pramipexole (MIRAPEX) 0.5 MG tablet; Take 2 tablets (1 mg total) by mouth every evening.  Dispense: 180 tablet; Refill: 0  7. Primary hypertension (Primary)  8. Hypothyroidism, unspecified type  9. Chronic low back pain, unspecified back pain laterality, unspecified whether sciatica present  10. Encounter for immunization - Flu vaccine trivalent PF, 6mos and older(Flulaval,Afluria,Fluarix,Fluzone)  11. Constipation, unspecified constipation type  12. Gastroesophageal reflux disease, unspecified whether esophagitis present - omeprazole (PRILOSEC) 40 MG capsule; Take 1 capsule (40  mg total) by mouth daily.  Dispense: 90 capsule; Refill: 1  13. Iron deficiency  14. Spinal stenosis of lumbar region without neurogenic claudication  15. Arthralgia, unspecified joint - Rheumatoid factor    Labs pending Keep specialists follow up Continue current medications  Health Maintenance reviewed Diet and exercise encouraged  Follow up plan: 6 months    Jannifer Rodney, FNP

## 2023-08-02 ENCOUNTER — Other Ambulatory Visit: Payer: Self-pay | Admitting: Family

## 2023-08-02 DIAGNOSIS — Z800 Family history of malignant neoplasm of digestive organs: Secondary | ICD-10-CM | POA: Diagnosis not present

## 2023-08-02 DIAGNOSIS — R1013 Epigastric pain: Secondary | ICD-10-CM | POA: Diagnosis not present

## 2023-08-02 DIAGNOSIS — R768 Other specified abnormal immunological findings in serum: Secondary | ICD-10-CM

## 2023-08-02 DIAGNOSIS — K5900 Constipation, unspecified: Secondary | ICD-10-CM | POA: Diagnosis not present

## 2023-08-02 LAB — TSH: TSH: 1.13 u[IU]/mL (ref 0.450–4.500)

## 2023-08-02 LAB — RHEUMATOID FACTOR: Rheumatoid fact SerPl-aCnc: 15.1 [IU]/mL — ABNORMAL HIGH (ref <14.0)

## 2023-08-06 ENCOUNTER — Encounter: Payer: Self-pay | Admitting: Hematology

## 2023-08-06 NOTE — Progress Notes (Signed)
Late entry:  Patient was educated in office about proper technique for administering IM injection.  She will begin giving herself B-12 injections monthly per provider order.  Described proper site identification, proper aseptic technique and administration.  She verbalizes understanding and was able to discuss proper administration after instruction.

## 2023-08-07 ENCOUNTER — Inpatient Hospital Stay: Payer: 59

## 2023-08-07 VITALS — BP 111/58 | HR 62 | Temp 97.4°F | Resp 16

## 2023-08-07 DIAGNOSIS — E538 Deficiency of other specified B group vitamins: Secondary | ICD-10-CM | POA: Diagnosis not present

## 2023-08-07 DIAGNOSIS — E785 Hyperlipidemia, unspecified: Secondary | ICD-10-CM | POA: Diagnosis not present

## 2023-08-07 DIAGNOSIS — I10 Essential (primary) hypertension: Secondary | ICD-10-CM | POA: Diagnosis not present

## 2023-08-07 DIAGNOSIS — K5900 Constipation, unspecified: Secondary | ICD-10-CM | POA: Diagnosis not present

## 2023-08-07 DIAGNOSIS — E039 Hypothyroidism, unspecified: Secondary | ICD-10-CM | POA: Diagnosis not present

## 2023-08-07 DIAGNOSIS — D75839 Thrombocytosis, unspecified: Secondary | ICD-10-CM | POA: Diagnosis not present

## 2023-08-07 DIAGNOSIS — G894 Chronic pain syndrome: Secondary | ICD-10-CM | POA: Diagnosis not present

## 2023-08-07 DIAGNOSIS — M5416 Radiculopathy, lumbar region: Secondary | ICD-10-CM | POA: Diagnosis not present

## 2023-08-07 DIAGNOSIS — Z833 Family history of diabetes mellitus: Secondary | ICD-10-CM | POA: Diagnosis not present

## 2023-08-07 DIAGNOSIS — G2581 Restless legs syndrome: Secondary | ICD-10-CM | POA: Diagnosis not present

## 2023-08-07 DIAGNOSIS — K219 Gastro-esophageal reflux disease without esophagitis: Secondary | ICD-10-CM | POA: Diagnosis not present

## 2023-08-07 DIAGNOSIS — R5383 Other fatigue: Secondary | ICD-10-CM | POA: Diagnosis not present

## 2023-08-07 DIAGNOSIS — R519 Headache, unspecified: Secondary | ICD-10-CM | POA: Diagnosis not present

## 2023-08-07 DIAGNOSIS — Z79899 Other long term (current) drug therapy: Secondary | ICD-10-CM | POA: Diagnosis not present

## 2023-08-07 DIAGNOSIS — E611 Iron deficiency: Secondary | ICD-10-CM

## 2023-08-07 DIAGNOSIS — Z87891 Personal history of nicotine dependence: Secondary | ICD-10-CM | POA: Diagnosis not present

## 2023-08-07 DIAGNOSIS — R200 Anesthesia of skin: Secondary | ICD-10-CM | POA: Diagnosis not present

## 2023-08-07 DIAGNOSIS — Z9889 Other specified postprocedural states: Secondary | ICD-10-CM | POA: Diagnosis not present

## 2023-08-07 DIAGNOSIS — R002 Palpitations: Secondary | ICD-10-CM | POA: Diagnosis not present

## 2023-08-07 DIAGNOSIS — D509 Iron deficiency anemia, unspecified: Secondary | ICD-10-CM | POA: Diagnosis not present

## 2023-08-07 DIAGNOSIS — Z8249 Family history of ischemic heart disease and other diseases of the circulatory system: Secondary | ICD-10-CM | POA: Diagnosis not present

## 2023-08-07 DIAGNOSIS — M549 Dorsalgia, unspecified: Secondary | ICD-10-CM | POA: Diagnosis not present

## 2023-08-07 DIAGNOSIS — Z888 Allergy status to other drugs, medicaments and biological substances status: Secondary | ICD-10-CM | POA: Diagnosis not present

## 2023-08-07 DIAGNOSIS — Z91041 Radiographic dye allergy status: Secondary | ICD-10-CM | POA: Diagnosis not present

## 2023-08-07 MED ORDER — SODIUM CHLORIDE 0.9 % IV SOLN: INTRAVENOUS | Status: DC

## 2023-08-07 MED ORDER — CETIRIZINE HCL 10 MG PO TABS
10.0000 mg | ORAL_TABLET | Freq: Once | ORAL | Status: AC
  Administered 2023-08-07: 10 mg via ORAL
  Filled 2023-08-07: qty 1

## 2023-08-07 MED ORDER — FAMOTIDINE IN NACL 20-0.9 MG/50ML-% IV SOLN
20.0000 mg | Freq: Once | INTRAVENOUS | Status: AC
  Administered 2023-08-07: 20 mg via INTRAVENOUS
  Filled 2023-08-07: qty 50

## 2023-08-07 MED ORDER — SODIUM CHLORIDE 0.9 % IV SOLN
510.0000 mg | Freq: Once | INTRAVENOUS | Status: AC
  Administered 2023-08-07: 510 mg via INTRAVENOUS
  Filled 2023-08-07: qty 510

## 2023-08-07 MED ORDER — METHYLPREDNISOLONE SODIUM SUCC 125 MG IJ SOLR
125.0000 mg | Freq: Once | INTRAMUSCULAR | Status: AC
  Administered 2023-08-07: 125 mg via INTRAVENOUS
  Filled 2023-08-07: qty 2

## 2023-08-07 MED ORDER — ACETAMINOPHEN 325 MG PO TABS
650.0000 mg | ORAL_TABLET | Freq: Once | ORAL | Status: AC
  Administered 2023-08-07: 650 mg via ORAL
  Filled 2023-08-07: qty 2

## 2023-08-07 NOTE — Progress Notes (Signed)
 Patient tolerated iron infusion with no complaints voiced.  Peripheral IV site clean and dry with good blood return noted before and after infusion.  Band aid applied. Pt observed for 30 minutes post iron infusion without any complications.  VSS with discharge and left in satisfactory condition with no s/s of distress noted. All follow ups as scheduled.  Diannia Hogenson [employer]

## 2023-08-07 NOTE — Patient Instructions (Signed)

## 2023-09-09 ENCOUNTER — Ambulatory Visit (INDEPENDENT_AMBULATORY_CARE_PROVIDER_SITE_OTHER): Payer: 59

## 2023-09-09 VITALS — Ht 61.0 in | Wt 166.0 lb

## 2023-09-09 DIAGNOSIS — Z0000 Encounter for general adult medical examination without abnormal findings: Secondary | ICD-10-CM | POA: Diagnosis not present

## 2023-09-09 NOTE — Progress Notes (Signed)
 Subjective:   Brittany Lynch is a 63 y.o. who presents for a Medicare Wellness preventive visit.  Visit Complete: Virtual I connected with  Brittany Lynch on 09/09/23 by a audio enabled telemedicine application and verified that I am speaking with the correct person using two identifiers.  Patient Location: Home  Provider Location: Home Office  I discussed the limitations of evaluation and management by telemedicine. The patient expressed understanding and agreed to proceed.  Vital Signs: Because this visit was a virtual/telehealth visit, some criteria may be missing or patient reported. Any vitals not documented were not able to be obtained and vitals that have been documented are patient reported.  VideoDeclined- This patient declined Librarian, academic. Therefore the visit was completed with audio only.  AWV Questionnaire: No: Patient Medicare AWV questionnaire was not completed prior to this visit.  Cardiac Risk Factors include: advanced age (>57men, >70 women);dyslipidemia;hypertension     Objective:    Today's Vitals   09/09/23 1300  Weight: 166 lb (75.3 kg)  Height: 5\' 1"  (1.549 m)   Body mass index is 31.37 kg/m.     09/09/2023    1:25 PM 07/29/2023   10:18 AM 09/03/2022    1:36 PM 07/27/2022   11:14 AM 01/09/2022    2:57 PM 09/01/2021    9:10 AM 07/04/2021    8:48 AM  Advanced Directives  Does Patient Have a Medical Advance Directive? No No No No No No No  Would patient like information on creating a medical advance directive? Yes (MAU/Ambulatory/Procedural Areas - Information given) No - Patient declined No - Patient declined No - Patient declined No - Patient declined No - Patient declined No - Patient declined    Current Medications (verified) Outpatient Encounter Medications as of 09/09/2023  Medication Sig   atorvastatin (LIPITOR) 20 MG tablet Take 1 tablet (20 mg total) by mouth daily.   buPROPion (WELLBUTRIN XL) 300 MG 24 hr  tablet Take 1 tablet (300 mg total) by mouth daily.   chlorhexidine (PERIDEX) 0.12 % solution 15 mLs 2 (two) times daily.   cyanocobalamin (VITAMIN B12) 1000 MCG/ML injection Inject 1 mL (1,000 mcg total) into the muscle every 30 (thirty) days.   desvenlafaxine (PRISTIQ) 100 MG 24 hr tablet Take 1 tablet (100 mg total) by mouth daily.   diazepam (VALIUM) 5 MG tablet When she has back inj   diphenhydrAMINE (BENADRYL) 25 mg capsule Take 25 mg by mouth every 6 (six) hours as needed for itching (after neck injections).   estradiol (ESTRACE) 2 MG tablet Take 1 tablet (2 mg total) by mouth daily.   fenofibrate (TRICOR) 145 MG tablet Take 1 tablet (145 mg total) by mouth daily.   ferrous sulfate 324 MG TBEC Take 324 mg by mouth.   ibuprofen (ADVIL) 800 MG tablet Take 800 mg by mouth every 8 (eight) hours as needed.   levothyroxine (SYNTHROID) 100 MCG tablet Take 1 tablet (100 mcg total) by mouth daily.   LINZESS 72 MCG capsule Take by mouth.   Magnesium Oxide -Mg Supplement ([employer] MAGNESIUM) 500 MG TABS Take by mouth.   metoprolol tartrate (LOPRESSOR) 50 MG tablet Take 1 tablet (50 mg total) by mouth 2 (two) times daily.   Needles & Syringes MISC 1 each by Does not apply route every 30 (thirty) days. Use for monthly B12 injections.   omeprazole (PRILOSEC) 40 MG capsule Take 1 capsule (40 mg total) by mouth daily.   Oxycodone HCl 10 MG TABS  Take 10 mg by mouth every 6 (six) hours as needed for severe pain.   pramipexole (MIRAPEX) 0.5 MG tablet Take 2 tablets (1 mg total) by mouth every evening.   Vitamin D, Ergocalciferol, (DRISDOL) 1.25 MG (50000 UNIT) CAPS capsule Take 1 capsule (50,000 Units total) by mouth every 7 (seven) days.   No facility-administered encounter medications on file as of 09/09/2023.    Allergies (verified) Chocolate, Iodinated contrast media, Methotrexate derivatives, Lisinopril, and Tape   History: Past Medical History:  Diagnosis Date   Anemia    Anxiety    Arthritis     spine/neck   Depression    GERD (gastroesophageal reflux disease)    Headache    Hyperlipidemia    Hypertension    Hypothyroidism    Iron deficiency 04/28/2021   Thyroid disease    Past Surgical History:  Procedure Laterality Date   ABDOMINAL HYSTERECTOMY     ANTERIOR CERVICAL DECOMP/DISCECTOMY FUSION N/A 02/08/2021   Procedure: CERVICAL FOUR-FIVE ANTERIOR CERVICAL DECOMPRESSION/DISCECTOMY FUSION;  Surgeon: Tia Alert, MD;  [facility];  Service: Neurosurgery;  Laterality: N/A;  3C   BACK SURGERY     CATARACT EXTRACTION Bilateral    EYE SURGERY     LUMBAR LAMINECTOMY/DECOMPRESSION MICRODISCECTOMY Bilateral 03/06/2018   Procedure: Laminectomy and Foraminotomy - Lumbar Two-Three, Three-Four bilateral;  Surgeon: Tia Alert, MD;  Location: [facility] OR;  Service: Neurosurgery;  Laterality: Bilateral;  Laminectomy and Foraminotomy - Lumbar Two-Three, Three-Four bilateral   NECK SURGERY     Right hand and wrist surgery Right    Laceration    SPINE SURGERY     Family History  Problem Relation Age of Onset   Mental illness Mother    Alcohol abuse Father    Diabetes Father    Heart disease Father    Breast cancer Neg Hx    Social History   Socioeconomic History   Marital status: Legally Separated    Spouse name: Not on file   Number of children: 3   Years of education: Not on file   Highest education level: 9th grade  Occupational History   Occupation: Retired  Tobacco Use   Smoking status: Former    Current packs/day: 0.00    Types: Cigarettes    Quit date: 07/16/2001    Years since quitting: 22.1   Smokeless tobacco: Never  Vaping Use   Vaping status: Never Used  Substance and Sexual Activity   Alcohol use: No    Comment: hx alcoholism. none since age 60   Drug use: Yes    Comment: once every 2 weeks [facility])   Sexual activity: Yes  Other Topics Concern   Not on file  Social History Narrative   Lives in apartment basement of daughters home    Social Drivers of  Health   Financial Resource Strain: Low Risk  (09/09/2023)   Overall Financial Resource Strain (CARDIA)    Difficulty of Paying Living Expenses: Not hard at all  Food Insecurity: No Food Insecurity (09/09/2023)   Hunger Vital Sign    Worried About Running Out of Food in the Last Year: Never true    Ran Out of Food in the Last Year: Never true  Transportation Needs: No Transportation Needs (09/09/2023)   PRAPARE - Administrator, Civil Service (Medical): No    Lack of Transportation (Non-Medical): No  Physical Activity: Insufficiently Active (09/09/2023)   Exercise Vital Sign    Days of Exercise per Week: 3 days  Minutes of Exercise per Session: 30 min  Stress: No Stress Concern Present (09/09/2023)   [employer] of Occupational Health - Occupational Stress Questionnaire    Feeling of Stress : Not at all  Social Connections: Moderately Integrated (09/09/2023)   Social Connection and Isolation Panel [NHANES]    Frequency of Communication with Friends and Family: More than three times a week    Frequency of Social Gatherings with Friends and Family: Three times a week    Attends Religious Services: More than 4 times per year    Active Member of Clubs or Organizations: Yes    Attends Banker Meetings: 1 to 4 times per year    Marital Status: Separated    Tobacco Counseling Counseling given: Not Answered    Clinical Intake:  Pre-visit preparation completed: Yes  Pain : No/denies pain     Diabetes: No  How often do you need to have someone help you when you read instructions, pamphlets, or other written materials from your doctor or pharmacy?: 1 - Never  Interpreter Needed?: No  Information entered by :: Kandis Fantasia LPN   Activities of Daily Living     09/09/2023    1:24 PM  In your present state of health, do you have any difficulty performing the following activities:  Hearing? 0  Vision? 0  Difficulty concentrating or making  decisions? 0  Walking or climbing stairs? 0  Dressing or bathing? 0  Doing errands, shopping? 0  Preparing Food and eating ? N  Using the Toilet? N  In the past six months, have you accidently leaked urine? N  Do you have problems with loss of bowel control? N  Managing your Medications? N  Managing your Finances? N  Housekeeping or managing your Housekeeping? N    Patient Care Team: Junie Spencer, FNP as PCP - General (Family Medicine) Jena Gauss Gerrit Friends, MD as Consulting Physician (Gastroenterology) Darolyn Rua as Physician Assistant (Oncology) Renaldo Fiddler, MD as Consulting Physician (Pain Medicine)  Indicate any recent Medical Services you may have received from other than [facility] providers in the past year (date may be approximate).     Assessment:   This is a routine wellness examination for Jaycey.  Hearing/Vision screen Hearing Screening - Comments:: Denies hearing difficulties   Vision Screening - Comments:: No vision problems; will schedule routine eye exam soon     Goals Addressed   None    Depression Screen     09/09/2023    1:09 PM 08/01/2023    3:54 PM 01/29/2023    2:59 PM 10/29/2022   10:14 AM 09/03/2022    1:35 PM 05/28/2022    3:55 PM 01/25/2022   11:37 AM  PHQ 2/9 Scores  PHQ - 2 Score 0 0 2 2 3 3 5   PHQ- 9 Score 3 3 12 11 10 15 17     Fall Risk     09/09/2023    1:24 PM 09/03/2022    1:34 PM 01/25/2022   11:37 AM 09/01/2021    9:01 AM 08/28/2021    8:40 AM  Fall Risk   Falls in the past year? 0 0 0 0 0  Number falls in past yr: 0 0  0 0  Injury with Fall? 0 0  0 0  Risk for fall due to : No Fall Risks   No Fall Risks   Follow up Falls prevention discussed;Education provided;Falls evaluation completed Falls prevention discussed;Education provided;Falls evaluation completed  Falls prevention discussed     MEDICARE RISK AT HOME:  Medicare Risk at Home Any stairs in or around the home?: No If so, are there any without handrails?:  No Home free of loose throw rugs in walkways, pet beds, electrical cords, etc?: Yes Adequate lighting in your home to reduce risk of falls?: Yes Life alert?: No Use of a cane, walker or w/c?: No Grab bars in the bathroom?: Yes Shower chair or bench in shower?: No Elevated toilet seat or a handicapped toilet?: Yes  TIMED UP AND GO:  Was the test performed?  No  Cognitive Function: 6CIT completed    08/25/2018    2:58 PM  MMSE - Mini Mental State Exam  Orientation to time 5  Orientation to Place 5  Registration 3  Attention/ Calculation 5  Recall 2  Language- name 2 objects 2  Language- repeat 1  Language- follow 3 step command 3  Language- read & follow direction 1  Write a sentence 1  Copy design 1  Total score 29        09/09/2023    1:25 PM 09/03/2022    1:37 PM 08/31/2020    9:51 AM 08/27/2019    9:36 AM  6CIT Screen  What Year? 0 points 0 points 0 points 0 points  What month? 0 points 0 points 0 points 0 points  What time? 0 points 0 points 0 points 0 points  Count back from 20 0 points 0 points 0 points 0 points  Months in reverse 0 points 0 points 0 points 0 points  Repeat phrase 0 points 0 points 0 points 0 points  Total Score 0 points 0 points 0 points 0 points    Immunizations Immunization History  Administered Date(s) Administered   H1N1 06/01/2008   Influenza Split 07/17/2011, 04/26/2013, 04/19/2015   Influenza, Seasonal, Injecte, Preservative Fre 08/01/2023   Influenza,inj,Quad PF,6+ Mos 05/06/2017, 08/14/2018, 07/26/2020, 04/28/2021, 05/28/2022   Influenza-Unspecified 07/17/2011, 01/28/2023, 01/29/2023   Moderna SARS-COV2 Booster Vaccination 09/14/2020   Moderna Sars-Covid-2 Vaccination 11/08/2019, 12/06/2019   Novel Infuenza-h1n1-09 06/01/2008   Tdap 09/02/2012, 10/29/2022   Zoster Recombinant(Shingrix) 07/24/2021, 09/26/2021    Screening Tests Health Maintenance  Topic Date Due   COVID-19 Vaccine (3 - Moderna risk series) 10/12/2020    Medicare Annual Wellness (AWV)  09/08/2024   MAMMOGRAM  09/16/2024   DTaP/Tdap/Td (3 - Td or Tdap) 10/28/2032   Colonoscopy  02/05/2033   INFLUENZA VACCINE  Completed   Hepatitis C Screening  Completed   HIV Screening  Completed   Zoster Vaccines- Shingrix  Completed   HPV VACCINES  Aged Out    Health Maintenance  Health Maintenance Due  Topic Date Due   COVID-19 Vaccine (3 - Moderna risk series) 10/12/2020   Health Maintenance Items Addressed: Mammogram scheduled  Additional Screening:  Vision Screening: Recommended annual ophthalmology exams for early detection of glaucoma and other disorders of the eye.  Dental Screening: Recommended annual dental exams for proper oral hygiene  Community Resource Referral / Chronic Care Management: CRR required this visit?  No   CCM required this visit?  No     Plan:     I have personally reviewed and noted the following in the patient's chart:   Medical and social history Use of alcohol, tobacco or illicit drugs  Current medications and supplements including opioid prescriptions. Patient is not currently taking opioid prescriptions. Functional ability and status Nutritional status Physical activity Advanced directives List of other physicians Hospitalizations,  surgeries, and ER visits in previous 12 months Vitals Screenings to include cognitive, depression, and falls Referrals and appointments  In addition, I have reviewed and discussed with patient certain preventive protocols, quality metrics, and best practice recommendations. A written personalized care plan for preventive services as well as general preventive health recommendations were provided to patient.     Kandis Fantasia [place], [place]   1/61/0960   After Visit Summary: (MyChart) Due to this being a telephonic visit, the after visit summary with patients personalized plan was offered to patient via MyChart   Notes: Nothing significant to report at this  time.

## 2023-09-09 NOTE — Patient Instructions (Signed)
 [name] , Thank you for taking time to come for your Medicare Wellness Visit. I appreciate your ongoing commitment to your health goals. Please review the following plan we discussed and let me know if I can assist you in the future.   Referrals/Orders/Follow-Ups/Clinician Recommendations: Aim for 30 minutes of exercise or brisk walking, 6-8 glasses of water, and 5 servings of fruits and vegetables each day.  This is a list of the screening recommended for you and due dates:  Health Maintenance  Topic Date Due   COVID-19 Vaccine (3 - Moderna risk series) 10/12/2020   Medicare Annual Wellness Visit  09/08/2024   Mammogram  09/16/2024   DTaP/Tdap/Td vaccine (3 - Td or Tdap) 10/28/2032   Colon Cancer Screening  02/05/2033   Flu Shot  Completed   Hepatitis C Screening  Completed   HIV Screening  Completed   Zoster (Shingles) Vaccine  Completed   HPV Vaccine  Aged Out    Advanced directives: (ACP Link)Information on Advanced Care Planning can be found at [facility] [place] Advance Health Care Directives Advance Health Care Directives ([email or web address]   Next Medicare Annual Wellness Visit scheduled for next year: Yes

## 2023-09-18 ENCOUNTER — Other Ambulatory Visit: Payer: Self-pay | Admitting: Family

## 2023-09-18 DIAGNOSIS — Z1231 Encounter for screening mammogram for malignant neoplasm of breast: Secondary | ICD-10-CM

## 2023-09-19 DIAGNOSIS — M5416 Radiculopathy, lumbar region: Secondary | ICD-10-CM | POA: Diagnosis not present

## 2023-09-23 ENCOUNTER — Ambulatory Visit
Admission: RE | Admit: 2023-09-23 | Discharge: 2023-09-23 | Disposition: A | Discharge status: Home / Self Care | Payer: 59 | Source: Ambulatory Visit

## 2023-09-23 DIAGNOSIS — Z1231 Encounter for screening mammogram for malignant neoplasm of breast: Secondary | ICD-10-CM | POA: Diagnosis not present

## 2023-10-21 ENCOUNTER — Other Ambulatory Visit: Payer: Self-pay | Admitting: Family

## 2023-10-21 DIAGNOSIS — E039 Hypothyroidism, unspecified: Secondary | ICD-10-CM

## 2023-10-23 DIAGNOSIS — Z9889 Other specified postprocedural states: Secondary | ICD-10-CM | POA: Diagnosis not present

## 2023-10-23 DIAGNOSIS — G894 Chronic pain syndrome: Secondary | ICD-10-CM | POA: Diagnosis not present

## 2023-10-23 DIAGNOSIS — M5416 Radiculopathy, lumbar region: Secondary | ICD-10-CM | POA: Diagnosis not present

## 2023-12-23 DIAGNOSIS — M5416 Radiculopathy, lumbar region: Secondary | ICD-10-CM | POA: Diagnosis not present

## 2023-12-26 ENCOUNTER — Encounter (INDEPENDENT_AMBULATORY_CARE_PROVIDER_SITE_OTHER): Payer: Self-pay

## 2023-12-30 ENCOUNTER — Ambulatory Visit (INDEPENDENT_AMBULATORY_CARE_PROVIDER_SITE_OTHER)

## 2023-12-30 ENCOUNTER — Ambulatory Visit: Payer: 59 | Attending: Internal Medicine | Admitting: Internal Medicine

## 2023-12-30 ENCOUNTER — Encounter: Payer: Self-pay | Admitting: Internal Medicine

## 2023-12-30 VITALS — BP 134/73 | HR 62 | Resp 14 | Ht 61.5 in | Wt 166.0 lb

## 2023-12-30 DIAGNOSIS — M25551 Pain in right hip: Secondary | ICD-10-CM | POA: Insufficient documentation

## 2023-12-30 DIAGNOSIS — M25552 Pain in left hip: Secondary | ICD-10-CM

## 2023-12-30 DIAGNOSIS — R768 Other specified abnormal immunological findings in serum: Secondary | ICD-10-CM | POA: Diagnosis not present

## 2023-12-30 NOTE — Progress Notes (Signed)
 Office Visit Note  Patient: Brittany Lynch             Date of Birth: [birth year]           MRN: [id number]             PCP: Yevette Hem, FNP Referring: Yevette Hem, FNP Visit Date: 12/30/2023 Occupation: Retired, mill work  Subjective:  New Patient (Initial Visit) (Patient states she has a rash in her left ear. Patient states she has a daughter and niece with psoriasis and psoriatic arthritis. Patient states she has pain in her groin, hips, knees, and legs. Patient states there is an annoying pain in her legs, kind of like a tooth ache. )    Discussed the use of AI scribe software for clinical note transcription with the patient, who gave verbal consent to proceed.  History of Present Illness   Brittany Lynch is a 63 year old female with arthritis who presents with worsening joint pain in multiple areas and abnormal lab test results. She is accompanied by her granddaughter and husband.  She experiences significant pain in her hip area that radiates down her legs, with the left side being worse than the right. This pain has been present for about fifteen years and has worsened recently, especially affecting her ability to walk. She describes the pain as a constant 'annoying toothache' and notes that it improves after taking ibuprofen  and resting for about thirty minutes. She currently takes four ibuprofen  at night for her arthritis, which she finds somewhat helpful. She also takes oxycodone  prescribed for her back pain, though she feels it does not address her arthritis symptoms.  She has a history of back issues and neck pain, which she describes as different from her current joint pain. She has had surgery for her back in the past but denies any specific joint injuries such as fractures or dislocations. No swelling in her legs and her joint pain is constant, not varying with time of day or activity level. No significant changes in her fingernails or toenails, although she  mentions a past fungal infection in her nails.  She reports a rash in her left ear canal that has been persistent for a long time. She denies any treatment with antibiotic or steroid drops for this issue. No recent serious infections or antibiotic use, although she occasionally gets sick but recovers without intervention.       Activities of Daily Living:  Patient reports morning stiffness for 1 hour.   Patient Denies nocturnal pain.  Difficulty dressing/grooming: Denies Difficulty climbing stairs: Reports Difficulty getting out of chair: Denies Difficulty using hands for taps, buttons, cutlery, and/or writing: Denies  Review of Systems  Constitutional:  Positive for fatigue.  HENT:  Positive for mouth dryness. Negative for mouth sores.   Eyes:  Positive for dryness.  Respiratory:  Negative for shortness of breath.   Cardiovascular:  Negative for chest pain and palpitations.  Gastrointestinal:  Positive for constipation and diarrhea. Negative for blood in stool.  Endocrine: Negative for increased urination.  Genitourinary:  Negative for involuntary urination.  Musculoskeletal:  Positive for joint pain, gait problem, joint pain, joint swelling, myalgias, morning stiffness and myalgias. Negative for muscle weakness and muscle tenderness.  Skin:  Negative for color change, rash, hair loss and sensitivity to sunlight.  Allergic/Immunologic: Negative for susceptible to infections.  Neurological:  Positive for headaches. Negative for dizziness.  Hematological:  Negative for swollen glands.  Psychiatric/Behavioral:  Positive  for sleep disturbance. Negative for depressed mood. The patient is not nervous/anxious.     PMFS History:  Patient Active Problem List   Diagnosis Date Noted   Bilateral hip pain 12/30/2023   Rheumatoid factor positive 12/30/2023   DJD (degenerative joint disease) 10/29/2022   Cervical radiculopathy 09/01/2021   Spinal stenosis in cervical region 09/01/2021   Iron  deficiency 04/28/2021   S/P cervical spinal fusion 02/08/2021   Neck pain 01/04/2021   Thrombocytosis 12/09/2020   Constipation 07/26/2020   RLS (restless legs syndrome) 07/26/2020   GERD (gastroesophageal reflux disease) 08/14/2018   S/P lumbar laminectomy 03/06/2018   Vitamin D  deficiency 10/18/2017   Hypertriglyceridemia 10/18/2017   History of colonic polyps 12/14/2015   GAD (generalized anxiety disorder) 09/20/2014   Hypothyroidism 09/20/2014   Chronic lower back pain 09/20/2014   Lumbosacral neuritis 06/17/2014   Lumbosacral stenosis 01/28/2014   Depression 01/25/2014   Continuous opioid dependence (HCC) 12/14/2013   Snoring 12/14/2013   Spinal stenosis of lumbar region 11/06/2013   Lumbar radiculopathy 11/04/2013   Hypertension 09/18/2013   Hyperlipidemia 09/18/2013   Lumbosacral radiculitis 08/17/2013    Past Medical History:  Diagnosis Date   Anemia    Anxiety    Arthritis    spine/neck   Depression    GERD (gastroesophageal reflux disease)    Headache    Hyperlipidemia    Hypertension    Hypothyroidism    Iron deficiency 04/28/2021   Thyroid  disease     Family History  Problem Relation Age of Onset   Mental illness Mother    Alcohol abuse Father    Diabetes Father    Heart disease Father    Psoriasis Daughter    Arthritis Daughter        Psoriatic   Psoriasis Niece    Arthritis Niece        Psoriatic   Breast cancer Neg Hx    Past Surgical History:  Procedure Laterality Date   ABDOMINAL HYSTERECTOMY     ANTERIOR CERVICAL DECOMP/DISCECTOMY FUSION N/A 02/08/2021   Procedure: CERVICAL FOUR-FIVE ANTERIOR CERVICAL DECOMPRESSION/DISCECTOMY FUSION;  Surgeon: Isadora Mar, MD;  Location: [facility] OR;  Service: Neurosurgery;  Laterality: N/A;  3C   BACK SURGERY     CATARACT EXTRACTION Bilateral    EYE SURGERY     LUMBAR LAMINECTOMY/DECOMPRESSION MICRODISCECTOMY Bilateral 03/06/2018   Procedure: Laminectomy and Foraminotomy - Lumbar Two-Three, Three-Four  bilateral;  Surgeon: Isadora Mar, MD;  Location: [facility] OR;  Service: Neurosurgery;  Laterality: Bilateral;  Laminectomy and Foraminotomy - Lumbar Two-Three, Three-Four bilateral   NECK SURGERY     Right hand and wrist surgery Right    Laceration    SPINE SURGERY     Social History   Social History Narrative   Lives in apartment basement of daughters home    Immunization History  Administered Date(s) Administered   H1N1 06/01/2008   Influenza Split 07/17/2011, 04/26/2013, 04/19/2015   Influenza, Seasonal, Injecte, Preservative Fre 08/01/2023   Influenza,inj,Quad PF,6+ Mos 05/06/2017, 08/14/2018, 07/26/2020, 04/28/2021, 05/28/2022   Influenza-Unspecified 07/17/2011, 01/28/2023, 01/29/2023   Moderna SARS-COV2 Booster Vaccination 09/14/2020   Moderna Sars-Covid-2 Vaccination 11/08/2019, 12/06/2019   Novel Infuenza-h1n1-09 06/01/2008   Tdap 09/02/2012, 10/29/2022   Zoster Recombinant(Shingrix ) 07/24/2021, 09/26/2021     Objective: Vital Signs: BP 134/73 (BP Location: Right Arm, Patient Position: Sitting, Cuff Size: Normal)   Pulse 62   Resp 14   Ht 5' 1.5 (1.562 m)   Wt 166 lb (75.3 kg)  BMI 30.86 kg/m    Physical Exam HENT:     Right Ear: External ear normal.     Left Ear: External ear normal.     Mouth/Throat:     Mouth: Mucous membranes are moist.     Pharynx: Oropharynx is clear.   Eyes:     Conjunctiva/sclera: Conjunctivae normal.    Cardiovascular:     Rate and Rhythm: Normal rate and regular rhythm.  Pulmonary:     Effort: Pulmonary effort is normal.     Breath sounds: Normal breath sounds.   Musculoskeletal:     Right lower leg: No edema.     Left lower leg: No edema.  Lymphadenopathy:     Cervical: No cervical adenopathy.   Skin:    General: Skin is warm and dry.     Findings: No rash.     Comments: Onychomycosis changes involving partial fingernails   Neurological:     Mental Status: She is alert.   Psychiatric:        Mood and Affect: Mood  normal.      Musculoskeletal Exam:  Shoulders full ROM no tenderness or swelling Elbows full ROM no tenderness or swelling Wrists full ROM no tenderness or swelling, right wrist scarring Fingers full ROM no tenderness or swelling No paraspinal tenderness to palpation over upper and lower back, mild patellofemoral crepitus Hip normal internal and external rotation without pain, no tenderness to lateral hip palpation Knees full ROM no tenderness or swelling Ankles full ROM no tenderness or swelling   Investigation: No additional findings.  Imaging: XR HIPS BILAT W OR W/O PELVIS 3-4 VIEWS Result Date: 12/30/2023 X-ray bilateral hips 3 views anterior and left and right lateral Visualized portion of lumbar spine demonstrates degenerative changes with some rotational effect.  SI joints are patent bilaterally.  There appears to be very prominent right iliac crest bone spurring suggestive for large enthesophyte or possible ex ostosis process.  Hip joint spaces appear fairly well-preserved but some marginal bone spurring visible at the left femoral acetabular joint.  No erosions or other abnormal calcifications seen. Impression Mild degenerative changes appearing slightly worse on left on x-ray, prominent right lateral bone spurring suggestive of chronic gluteal tendinopathy   Recent Labs: Lab Results  Component Value Date   WBC 7.6 07/22/2023   HGB 13.5 07/22/2023   PLT 438 (H) 07/22/2023   NA 137 07/22/2023   K 4.0 07/22/2023   CL 98 07/22/2023   CO2 29 07/22/2023   GLUCOSE 159 (H) 07/22/2023   BUN 14 07/22/2023   CREATININE 0.89 07/22/2023   BILITOT 0.2 07/22/2023   ALKPHOS 41 07/22/2023   AST 18 07/22/2023   ALT 18 07/22/2023   PROT 7.4 07/22/2023   ALBUMIN 4.5 07/22/2023   CALCIUM  9.9 07/22/2023   GFRAA 90 07/26/2020    Speciality Comments: No specialty comments available.  Procedures:  No procedures performed Allergies: Chocolate, Iodinated contrast media, Methotrexate  derivatives, Lisinopril , and Tape   Assessment / Plan:     Visit Diagnoses: Rheumatoid factor positive - Plan: Sedimentation rate, C-reactive protein, Rheumatoid factor, Cyclic citrul peptide antibody, IgG Low positive antibody tests require further evaluation.  Chronic joint pain in multiple areas but no peripheral synovitis present on exam today. Has concerns given family history of psoriasis but no skin or nail disease on exam today.  She does have some structural changes and bone spurring demonstrated on x-ray could also account for symptom complaints.  Apparently had a previous rheumatology evaluation  diagnosed as fibromyalgia syndrome with no inflammatory disease or long-term treatment started. If labs significantly abnormal can try addition of DMARD but no definite disease clinically, if labs okay would recommend PT/sports med management. - Checking CCP and repeat RF - Checking ESR and CRP for inflammatory activity assessment  Bilateral hip pain - Plan: XR HIPS BILAT W OR W/O PELVIS 3-4 VIEWS Arthritis Chronic hip pain with differential diagnosis including osteoarthritis, bursitis, or inflammatory arthritis.  - Order hip joint x-rays to assess joint structure and rule out osteoarthritis or other structural change, appears to be mild OA but mostly lateral iliac bone spurring  Ear rash Persistent rash in the left ear canal, described as a dry bump. Examination reveals no redness or scaling.     Orders: Orders Placed This Encounter  Procedures   XR HIPS BILAT W OR W/O PELVIS 3-4 VIEWS   Sedimentation rate   C-reactive protein   Rheumatoid factor   Cyclic citrul peptide antibody, IgG   No orders of the defined types were placed in this encounter.    Follow-Up Instructions: No follow-ups on file.   Matt Song, MD  Note - This record has been created using [employer].  Chart creation errors have been sought, but may not always  have been located. Such creation  errors do not reflect on  the standard of medical care.

## 2024-01-01 LAB — CYCLIC CITRUL PEPTIDE ANTIBODY, IGG: Cyclic Citrullin Peptide Ab: <16 U

## 2024-01-01 LAB — C-REACTIVE PROTEIN: CRP: <3 mg/L (ref <8.0)

## 2024-01-01 LAB — RHEUMATOID FACTOR: Rheumatoid fact SerPl-aCnc: 12 [IU]/mL (ref <14)

## 2024-01-01 LAB — SEDIMENTATION RATE: Sed Rate: 2 mm/h (ref 0–30)

## 2024-01-13 ENCOUNTER — Telehealth: Payer: Self-pay

## 2024-01-13 NOTE — Telephone Encounter (Signed)
 Patient contacted the office regarding lab results. Advised the patient Dr. Jeannetta will go over lab results at the new patient follow up. Patient verbalized understanding.

## 2024-01-14 ENCOUNTER — Other Ambulatory Visit: Payer: Self-pay | Admitting: Family

## 2024-01-14 DIAGNOSIS — F411 Generalized anxiety disorder: Secondary | ICD-10-CM

## 2024-01-14 DIAGNOSIS — G2581 Restless legs syndrome: Secondary | ICD-10-CM

## 2024-01-14 DIAGNOSIS — F331 Major depressive disorder, recurrent, moderate: Secondary | ICD-10-CM

## 2024-01-15 DIAGNOSIS — G894 Chronic pain syndrome: Secondary | ICD-10-CM | POA: Diagnosis not present

## 2024-01-15 DIAGNOSIS — M5416 Radiculopathy, lumbar region: Secondary | ICD-10-CM | POA: Diagnosis not present

## 2024-01-15 DIAGNOSIS — Z9889 Other specified postprocedural states: Secondary | ICD-10-CM | POA: Diagnosis not present

## 2024-01-15 DIAGNOSIS — M542 Cervicalgia: Secondary | ICD-10-CM | POA: Diagnosis not present

## 2024-01-27 ENCOUNTER — Other Ambulatory Visit: Payer: Self-pay | Admitting: Physician Assistant

## 2024-01-27 DIAGNOSIS — E538 Deficiency of other specified B group vitamins: Secondary | ICD-10-CM

## 2024-01-27 NOTE — Telephone Encounter (Signed)
 Per last office note, patient to take B-12 injections monthly.  Refilled per request.

## 2024-01-28 NOTE — Progress Notes (Signed)
 Office Visit Note  Patient: Brittany Lynch             Date of Birth: 1961/03/08           MRN: [id number]             PCP: Lavell Bari LABOR, FNP Referring: Lavell Bari LABOR, FNP Visit Date: 01/29/2024   Subjective:  Follow-up (Lab results )    Discussed the use of AI scribe software for clinical note transcription with the patient, who gave verbal consent to proceed.  History of Present Illness   Brittany Lynch is a 63 year old female who presents with joint pain and stiffness.  She has been experiencing joint pain and stiffness for at least a month, primarily located in her back and leg, with radiation down her leg. The pain is severe enough to cause emotional distress and worsens at night, causing significant discomfort.  She has been taking ibuprofen , four tablets every morning and evening, but it does not alleviate her pain. She finds relief with a topical arthritis medication containing 1% lidocaine . She recalls being given diclofenac  in the past, possibly during a previous back surgery.  Her past medical history includes back surgery, and she has had an MRI of her lumbar spine, followed by back shots. She has not used muscle relaxers recently but has tried them in the past.     Previous HPI 12/30/23 Brittany Lynch is a 63 year old female with arthritis who presents with worsening joint pain in multiple areas and abnormal lab test results. She is accompanied by her granddaughter and husband.   She experiences significant pain in her hip area that radiates down her legs, with the left side being worse than the right. This pain has been present for about fifteen years and has worsened recently, especially affecting her ability to walk. She describes the pain as a constant 'annoying toothache' and notes that it improves after taking ibuprofen  and resting for about thirty minutes. She currently takes four ibuprofen  at night for her arthritis, which she finds somewhat helpful.  She also takes oxycodone  prescribed for her back pain, though she feels it does not address her arthritis symptoms.   She has a history of back issues and neck pain, which she describes as different from her current joint pain. She has had surgery for her back in the past but denies any specific joint injuries such as fractures or dislocations. No swelling in her legs and her joint pain is constant, not varying with time of day or activity level. No significant changes in her fingernails or toenails, although she mentions a past fungal infection in her nails.   She reports a rash in her left ear canal that has been persistent for a long time. She denies any treatment with antibiotic or steroid drops for this issue. No recent serious infections or antibiotic use, although she occasionally gets sick but recovers without intervention.    Review of Systems  Constitutional:  Positive for fatigue.  HENT:  Negative for mouth sores and mouth dryness.   Eyes:  Negative for dryness.  Respiratory:  Positive for shortness of breath.   Cardiovascular:  Positive for palpitations. Negative for chest pain.  Gastrointestinal:  Negative for blood in stool, constipation and diarrhea.  Endocrine: Negative for increased urination.  Genitourinary:  Positive for involuntary urination.  Musculoskeletal:  Positive for joint pain, gait problem, joint pain, joint swelling, myalgias, muscle weakness, morning stiffness, muscle tenderness and  myalgias.  Skin:  Positive for rash. Negative for color change, hair loss and sensitivity to sunlight.  Allergic/Immunologic: Negative for susceptible to infections.  Neurological:  Positive for headaches. Negative for dizziness.  Hematological:  Negative for swollen glands.  Psychiatric/Behavioral:  Positive for sleep disturbance. Negative for depressed mood. The patient is nervous/anxious.     PMFS History:  Patient Active Problem List   Diagnosis Date Noted   Bilateral hip pain  12/30/2023   Rheumatoid factor positive 12/30/2023   DJD (degenerative joint disease) 10/29/2022   Cervical radiculopathy 09/01/2021   Spinal stenosis in cervical region 09/01/2021   Iron deficiency 04/28/2021   S/P cervical spinal fusion 02/08/2021   Neck pain 01/04/2021   Thrombocytosis 12/09/2020   Constipation 07/26/2020   RLS (restless legs syndrome) 07/26/2020   GERD (gastroesophageal reflux disease) 08/14/2018   S/P lumbar laminectomy 03/06/2018   Vitamin D  deficiency 10/18/2017   Hypertriglyceridemia 10/18/2017   History of colonic polyps 12/14/2015   GAD (generalized anxiety disorder) 09/20/2014   Hypothyroidism 09/20/2014   Chronic lower back pain 09/20/2014   Lumbosacral neuritis 06/17/2014   Lumbosacral stenosis 01/28/2014   Depression 01/25/2014   Continuous opioid dependence (HCC) 12/14/2013   Snoring 12/14/2013   Spinal stenosis of lumbar region 11/06/2013   Lumbar radiculopathy 11/04/2013   Hypertension 09/18/2013   Hyperlipidemia 09/18/2013   Lumbosacral radiculitis 08/17/2013    Past Medical History:  Diagnosis Date   Anemia    Anxiety    Arthritis    spine/neck   Depression    GERD (gastroesophageal reflux disease)    Headache    Hyperlipidemia    Hypertension    Hypothyroidism    Iron deficiency 04/28/2021   Thyroid  disease     Family History  Problem Relation Age of Onset   Mental illness Mother    Alcohol abuse Father    Diabetes Father    Heart disease Father    Psoriasis Daughter    Arthritis Daughter        Psoriatic   Psoriasis Niece    Arthritis Niece        Psoriatic   Breast cancer Neg Hx    Past Surgical History:  Procedure Laterality Date   ABDOMINAL HYSTERECTOMY     ANTERIOR CERVICAL DECOMP/DISCECTOMY FUSION N/A 02/08/2021   Procedure: CERVICAL FOUR-FIVE ANTERIOR CERVICAL DECOMPRESSION/DISCECTOMY FUSION;  Surgeon: Joshua Alm RAMAN, MD;  Location: [facility] OR;  Service: Neurosurgery;  Laterality: N/A;  3C   BACK SURGERY      CATARACT EXTRACTION Bilateral    EYE SURGERY     LUMBAR LAMINECTOMY/DECOMPRESSION MICRODISCECTOMY Bilateral 03/06/2018   Procedure: Laminectomy and Foraminotomy - Lumbar Two-Three, Three-Four bilateral;  Surgeon: Joshua Alm RAMAN, MD;  Location: [facility] OR;  Service: Neurosurgery;  Laterality: Bilateral;  Laminectomy and Foraminotomy - Lumbar Two-Three, Three-Four bilateral   NECK SURGERY     Right hand and wrist surgery Right    Laceration    SPINE SURGERY     Social History   Social History Narrative   Lives in apartment basement of daughters home    Immunization History  Administered Date(s) Administered   H1N1 06/01/2008   Influenza Split 07/17/2011, 04/26/2013, 04/19/2015   Influenza, Seasonal, Injecte, Preservative Fre 08/01/2023   Influenza,inj,Quad PF,6+ Mos 05/06/2017, 08/14/2018, 07/26/2020, 04/28/2021, 05/28/2022   Influenza-Unspecified 07/17/2011, 01/28/2023, 01/29/2023   Moderna SARS-COV2 Booster Vaccination 09/14/2020   Moderna Sars-Covid-2 Vaccination 11/08/2019, 12/06/2019   Novel Infuenza-h1n1-09 06/01/2008   Tdap 09/02/2012, 10/29/2022   Zoster Recombinant(Shingrix ) 07/24/2021, 09/26/2021  Objective: Vital Signs: BP 122/76 (BP Location: Left Arm, Patient Position: Sitting, Cuff Size: Normal)   Pulse (!) 58   Resp 16   Ht 5' 1.5 (1.562 m)   Wt 165 lb 9.6 oz (75.1 kg)   BMI 30.78 kg/m    Physical Exam Eyes:     Conjunctiva/sclera: Conjunctivae normal.  Cardiovascular:     Rate and Rhythm: Normal rate and regular rhythm.  Pulmonary:     Effort: Pulmonary effort is normal.     Breath sounds: Normal breath sounds.  Skin:    General: Skin is warm and dry.     Findings: No rash.  Neurological:     Mental Status: She is alert.  Psychiatric:        Mood and Affect: Mood normal.      Musculoskeletal Exam:  Shoulders full ROM no tenderness or swelling Elbows full ROM no tenderness or swelling Wrists full ROM no tenderness or swelling, right wrist  scarring Fingers full ROM no tenderness or swelling Tenderness to pressure over the left low back and glutes, does not extend significantly down past hip Straight leg raise just limited by tightness in low back no radiculopathy provoked Hip normal internal and external rotation without pain, no tenderness to lateral hip palpation Knees full ROM no tenderness or swelling Ankles full ROM no tenderness or swelling  Investigation: No additional findings.  Imaging: No results found.   Recent Labs: Lab Results  Component Value Date   WBC 7.0 01/31/2024   HGB 14.1 01/31/2024   PLT 423 01/31/2024   NA 141 01/31/2024   K 4.6 01/31/2024   CL 99 01/31/2024   CO2 25 01/31/2024   GLUCOSE 86 01/31/2024   BUN 17 01/31/2024   CREATININE 0.98 01/31/2024   BILITOT 0.3 01/31/2024   ALKPHOS 42 (L) 01/31/2024   AST 19 01/31/2024   ALT 18 01/31/2024   PROT 7.5 01/31/2024   ALBUMIN 4.9 01/31/2024   CALCIUM  10.4 (H) 01/31/2024   GFRAA 90 07/26/2020    Speciality Comments: No specialty comments available.  Procedures:  No procedures performed Allergies: Chocolate, Iodinated contrast media, Methotrexate derivatives, Lisinopril , and Tape   Assessment / Plan:     Visit Diagnoses: Bilateral hip pain - Plan: diclofenac  (VOLTAREN ) 50 MG EC tablet, cyclobenzaprine  (FLEXERIL ) 5 MG tablet Rheumatoid factor positive Osteoarthritis of multiple joints, unspecified osteoarthritis type - Plan: diclofenac  (VOLTAREN ) 50 MG EC tablet, cyclobenzaprine  (FLEXERIL ) 5 MG tablet Joint pain and stiffness with negative rheumatoid factor and normal inflammatory markers, suggesting non-inflammatory causes.  Again with no peripheral joint inflammatory changes on exam.  Consider structural or mechanical issues. - Prescribed oral diclofenac  50 mg up to twice daily as an alternative to ibuprofen . - Prescribed Flexeril  5 mg for nighttime use, cautioning about drowsiness. - Provided home exercises for muscle and joint  issues. - Consider referral to sports medicine or orthopedic specialist if no improvement or worsening, for further evaluation and potential imaging.     Orders: No orders of the defined types were placed in this encounter.  Meds ordered this encounter  Medications   diclofenac  (VOLTAREN ) 50 MG EC tablet    Sig: Take 1 tablet (50 mg total) by mouth 2 (two) times daily as needed.    Dispense:  60 tablet    Refill:  1   cyclobenzaprine  (FLEXERIL ) 5 MG tablet    Sig: Take 1 tablet (5 mg total) by mouth at bedtime as needed.    Dispense:  30 tablet  Refill:  1     Follow-Up Instructions: Return in about 3 months (around 04/30/2024), or if symptoms worsen or fail to improve, for OA/back pain NSAID/flex f/u 3mos.   Lonni LELON Ester, MD  Note - This record has been created using [employer].  Chart creation errors have been sought, but may not always  have been located. Such creation errors do not reflect on  the standard of medical care.

## 2024-01-29 ENCOUNTER — Encounter: Payer: Self-pay | Admitting: Internal Medicine

## 2024-01-29 ENCOUNTER — Ambulatory Visit: Attending: Internal Medicine | Admitting: Internal Medicine

## 2024-01-29 VITALS — BP 122/76 | HR 58 | Resp 16 | Ht 61.5 in | Wt 165.6 lb

## 2024-01-29 DIAGNOSIS — M159 Polyosteoarthritis, unspecified: Secondary | ICD-10-CM | POA: Diagnosis not present

## 2024-01-29 DIAGNOSIS — M25552 Pain in left hip: Secondary | ICD-10-CM | POA: Diagnosis not present

## 2024-01-29 DIAGNOSIS — M25551 Pain in right hip: Secondary | ICD-10-CM | POA: Diagnosis not present

## 2024-01-29 DIAGNOSIS — R768 Other specified abnormal immunological findings in serum: Secondary | ICD-10-CM | POA: Diagnosis not present

## 2024-01-29 MED ORDER — CYCLOBENZAPRINE HCL 5 MG PO TABS: 5.0000 mg | ORAL_TABLET | Freq: Every evening | ORAL | 1 refills | Status: DC | PRN

## 2024-01-29 MED ORDER — DICLOFENAC SODIUM 50 MG PO TBEC: 50.0000 mg | DELAYED_RELEASE_TABLET | Freq: Two times a day (BID) | ORAL | 1 refills | Status: DC | PRN

## 2024-01-29 NOTE — Patient Instructions (Signed)
 Strengthening exercises These exercises build strength and endurance in your hip. Endurance is the ability to use your muscles for a long time, even after they get tired. Bridge This exercise strengthens the muscles of your hip (hip extensors). Lie on your back on a firm surface with your knees bent and your feet flat on the floor. Tighten your butt muscles and lift your bottom off the floor until the trunk of your body and your hips are level with your thighs. Do not arch your back. You should feel the muscles working in your butt and the back of your thighs. If you do not feel these muscles, slide your feet 1-2 inches (2.5-5 cm) farther away from your butt. Hold this position for __________ seconds. Slowly lower your hips to the starting position. Let your muscles relax completely between repetitions. Repeat __________ times. Complete this exercise __________ times a day. Straight leg raises, side-lying This exercise strengthens the muscles that move the hip joint away from the center of the body (hip abductors). Lie on your side with your left / right leg in the top position. Lie so your head, shoulder, hip, and knee line up. You may bend your bottom knee slightly to help you balance. Roll your hips slightly forward, so your hips are stacked directly over each other and your left / right knee is facing forward. Leading with your heel, lift your top leg 4-6 inches (10-15 cm). You should feel the muscles in your top hip lifting. Do not let your foot drift forward. Do not let your knee roll toward the ceiling. Hold this position for __________ seconds. Slowly return to the starting position. Let your muscles relax completely between repetitions. Repeat __________ times. Complete this exercise __________ times a day. Straight leg raises, side-lying This exercise strengthens the muscles that move the hip joint toward the center of the body (hip adductors). Lie on your side with your left /  right leg in the bottom position. Lie so your head, shoulder, hip, and knee line up. You may place your upper foot in front to help you balance. Roll your hips slightly forward, so your hips are stacked directly over each other and your left / right knee is facing forward. Tense the muscles in your inner thigh and lift your bottom leg 4-6 inches (10-15 cm). Hold this position for __________ seconds. Slowly return to the starting position. Let your muscles relax completely between repetitions. Repeat __________ times. Complete this exercise __________ times a day. Straight leg raises, supine This exercise strengthens the muscles in the front of your thigh (quadriceps and hip flexors). Lie on your back (supine position) with your left / right leg extended and your other knee bent. Tense the muscles in the front of your left / right thigh. You should see your kneecap slide up or see increased dimpling just above your knee. Keep these muscles tight as you raise your leg 4-6 inches (10-15 cm) off the floor. Do not let your knee bend. Hold this position for __________ seconds. Keep these muscles tense as you lower your leg. Relax the muscles slowly and completely between repetitions. Repeat __________ times. Complete this exercise __________ times a day. Squats This exercise strengthens the muscles in the front of your thigh (quadriceps). Stand in front of a table, or stand in a doorframe so your feet and knees are in line with the frame. You may place your hands on the table or frame for balance. Slowly bend your knees and lower  your hips like you are going to sit in a chair. Keep your lower legs in a straight up-and-down position. Do not let your hips go lower than your knees. Do not bend your knees lower than told by your provider. If your hip pain increases, do not bend as low. Hold this position for ___________ seconds. Slowly push with your legs to return to standing. Do not use your hands  to pull yourself to standing. Repeat __________ times. Complete this exercise __________ times a day.

## 2024-01-31 ENCOUNTER — Other Ambulatory Visit (HOSPITAL_COMMUNITY)
Admission: RE | Admit: 2024-01-31 | Discharge: 2024-01-31 | Disposition: A | Discharge status: Home / Self Care | Source: Ambulatory Visit | Attending: Family | Admitting: Family

## 2024-01-31 ENCOUNTER — Ambulatory Visit: Payer: 59 | Admitting: Family

## 2024-01-31 VITALS — BP 127/84 | HR 69 | Temp 96.4°F | Ht 61.5 in | Wt 164.8 lb

## 2024-01-31 DIAGNOSIS — I10 Essential (primary) hypertension: Secondary | ICD-10-CM

## 2024-01-31 DIAGNOSIS — E039 Hypothyroidism, unspecified: Secondary | ICD-10-CM | POA: Diagnosis not present

## 2024-01-31 DIAGNOSIS — M545 Low back pain, unspecified: Secondary | ICD-10-CM | POA: Diagnosis not present

## 2024-01-31 DIAGNOSIS — K219 Gastro-esophageal reflux disease without esophagitis: Secondary | ICD-10-CM | POA: Diagnosis not present

## 2024-01-31 DIAGNOSIS — F331 Major depressive disorder, recurrent, moderate: Secondary | ICD-10-CM

## 2024-01-31 DIAGNOSIS — Z01419 Encounter for gynecological examination (general) (routine) without abnormal findings: Secondary | ICD-10-CM | POA: Diagnosis present

## 2024-01-31 DIAGNOSIS — Z0000 Encounter for general adult medical examination without abnormal findings: Secondary | ICD-10-CM | POA: Insufficient documentation

## 2024-01-31 DIAGNOSIS — G2581 Restless legs syndrome: Secondary | ICD-10-CM | POA: Diagnosis not present

## 2024-01-31 DIAGNOSIS — K5900 Constipation, unspecified: Secondary | ICD-10-CM

## 2024-01-31 DIAGNOSIS — F411 Generalized anxiety disorder: Secondary | ICD-10-CM

## 2024-01-31 DIAGNOSIS — G8929 Other chronic pain: Secondary | ICD-10-CM | POA: Diagnosis not present

## 2024-01-31 DIAGNOSIS — E785 Hyperlipidemia, unspecified: Secondary | ICD-10-CM | POA: Diagnosis not present

## 2024-01-31 DIAGNOSIS — Z9889 Other specified postprocedural states: Secondary | ICD-10-CM

## 2024-01-31 DIAGNOSIS — Z0001 Encounter for general adult medical examination with abnormal findings: Secondary | ICD-10-CM

## 2024-01-31 MED ORDER — PRAMIPEXOLE DIHYDROCHLORIDE 0.5 MG PO TABS: 1.0000 mg | ORAL_TABLET | Freq: Every evening | ORAL | 0 refills | Status: DC

## 2024-01-31 MED ORDER — FENOFIBRATE 145 MG PO TABS: 145.0000 mg | ORAL_TABLET | Freq: Every day | ORAL | 1 refills | Status: DC

## 2024-01-31 MED ORDER — ATORVASTATIN CALCIUM 20 MG PO TABS: 20.0000 mg | ORAL_TABLET | Freq: Every day | ORAL | 1 refills | Status: DC

## 2024-01-31 NOTE — Patient Instructions (Signed)

## 2024-01-31 NOTE — Progress Notes (Signed)
 Subjective:    Patient ID: Brittany Lynch, female    DOB: 04-Nov-1960, 64 y.o.   MRN: [id number]  No chief complaint on file.  Pt presents to the office today for CPE with pap.    She is followed by Neurosurgeon and pain management every month and has hx of lumbar laminectomy and hx of cervical spinal fusion.   She is followed by Hematologists every 6 months for iron deficiency and thrombocytosis.  She has RLS and takes mirapex  1 mg that helps.  Hypertension This is a chronic problem. The current episode started more than 1 year ago. The problem has been resolved since onset. The problem is controlled. Associated symptoms include anxiety and malaise/fatigue. Pertinent negatives include no peripheral edema or shortness of breath. Risk factors for coronary artery disease include dyslipidemia, obesity and sedentary lifestyle. The current treatment provides moderate improvement. Identifiable causes of hypertension include a thyroid  problem.  Gastroesophageal Reflux She complains of belching and heartburn. She reports no hoarse voice. This is a chronic problem. The current episode started more than 1 year ago. The problem occurs occasionally. The symptoms are aggravated by certain foods. Associated symptoms include fatigue. She has tried an antacid and a PPI for the symptoms. The treatment provided moderate relief.  Thyroid  Problem Presents for follow-up visit. Symptoms include anxiety and fatigue. Patient reports no constipation or hoarse voice. The symptoms have been stable.  Back Pain This is a chronic problem. The current episode started more than 1 year ago. The problem occurs intermittently. The problem has been waxing and waning since onset. The pain is present in the lumbar spine. The pain is at a severity of 4/10 (with pain medicaitons). The pain is moderate. The symptoms are aggravated by bending. She has tried analgesics for the symptoms. The treatment provided moderate relief.   Anxiety Presents for follow-up visit. Symptoms include excessive worry and nervous/anxious behavior. Patient reports no shortness of breath. Symptoms occur most days. The severity of symptoms is mild.    Hyperlipidemia This is a chronic problem. The current episode started more than 1 year ago. The problem is uncontrolled. Recent lipid tests were reviewed and are high. Exacerbating diseases include obesity. Pertinent negatives include no shortness of breath. Current antihyperlipidemic treatment includes statins. The current treatment provides mild improvement of lipids. Risk factors for coronary artery disease include dyslipidemia, hypertension and a sedentary lifestyle.  Depression        This is a chronic problem.  The current episode started more than 1 year ago.   The problem occurs intermittently.  Associated symptoms include fatigue.  Associated symptoms include no helplessness, no hopelessness and not sad.  Past treatments include SNRIs - Serotonin and norepinephrine reuptake inhibitors.  Past medical history includes thyroid  problem and anxiety.   Constipation This is a chronic problem. The current episode started more than 1 year ago. The problem has been resolved since onset. Her stool frequency is 1 time per day. Associated symptoms include back pain. She has tried laxatives (linzess ) for the symptoms. The treatment provided moderate relief.  Arthritis Presents for follow-up visit. She complains of pain and stiffness. She reports no joint swelling. The symptoms have been worsening. Affected locations include the left MCP, right MCP, left shoulder, right shoulder, left elbow, right elbow, left knee and right knee. Her pain is at a severity of 4/10. Associated symptoms include fatigue.  Gynecologic Exam The patient's pertinent negatives include no genital itching, genital odor, vaginal bleeding or vaginal discharge. Associated  symptoms include back pain. Pertinent negatives include no  constipation.      Review of Systems  Constitutional:  Positive for fatigue and malaise/fatigue.  HENT:  Negative for hoarse voice.   Respiratory:  Negative for shortness of breath.   Gastrointestinal:  Positive for heartburn. Negative for constipation.  Genitourinary:  Negative for vaginal discharge.  Musculoskeletal:  Positive for back pain and stiffness. Negative for joint swelling.  Psychiatric/Behavioral:  The patient is nervous/anxious.   All other systems reviewed and are negative.  Family History  Problem Relation Age of Onset   Mental illness Mother    Alcohol abuse Father    Diabetes Father    Heart disease Father    Psoriasis Daughter    Arthritis Daughter        Psoriatic   Psoriasis Niece    Arthritis Niece        Psoriatic   Breast cancer Neg Hx    Social History   Socioeconomic History   Marital status: Legally Separated    Spouse name: Not on file   Number of children: 3   Years of education: Not on file   Highest education level: 9th grade  Occupational History   Occupation: Retired  Tobacco Use   Smoking status: Former    Current packs/day: 0.00    Types: Cigarettes    Quit date: 07/16/2001    Years since quitting: 22.5    Passive exposure: Current   Smokeless tobacco: Never  Vaping Use   Vaping status: Never Used  Substance and Sexual Activity   Alcohol use: No    Comment: hx alcoholism. none since age 66   Drug use: Yes    Comment: once every 2 weeks [facility])   Sexual activity: Yes  Other Topics Concern   Not on file  Social History Narrative   Lives in apartment basement of daughters home    Social Drivers of Health   Financial Resource Strain: High Risk (01/31/2024)   Overall Financial Resource Strain (CARDIA)    Difficulty of Paying Living Expenses: Hard  Food Insecurity: Food Insecurity Present (01/31/2024)   Hunger Vital Sign    Worried About Running Out of Food in the Last Year: Often true    Ran Out of Food in the Last Year:  Often true  Transportation Needs: No Transportation Needs (01/31/2024)   PRAPARE - Administrator, Civil Service (Medical): No    Lack of Transportation (Non-Medical): No  Physical Activity: Insufficiently Active (09/09/2023)   Exercise Vital Sign    Days of Exercise per Week: 3 days    Minutes of Exercise per Session: 30 min  Stress: No Stress Concern Present (09/09/2023)   [employer] of Occupational Health - Occupational Stress Questionnaire    Feeling of Stress : Not at all  Social Connections: Socially Isolated (01/31/2024)   Social Connection and Isolation Panel    Frequency of Communication with Friends and Family: More than three times a week    Frequency of Social Gatherings with Friends and Family: Once a week    Attends Religious Services: Never    Database administrator or Organizations: No    Attends Engineer, structural: Not on file    Marital Status: Separated       Objective:   Physical Exam Vitals reviewed.  Constitutional:      General: She is not in acute distress.    Appearance: She is well-developed. She is obese.  HENT:  Head: Normocephalic and atraumatic.     Right Ear: Tympanic membrane normal.     Left Ear: Tympanic membrane normal.  Eyes:     Pupils: Pupils are equal, round, and reactive to light.  Neck:     Thyroid : No thyromegaly.  Cardiovascular:     Rate and Rhythm: Normal rate and regular rhythm.     Heart sounds: Normal heart sounds. No murmur heard. Pulmonary:     Effort: Pulmonary effort is normal. No respiratory distress.     Breath sounds: Normal breath sounds. No wheezing.  Abdominal:     General: Bowel sounds are normal. There is no distension.     Palpations: Abdomen is soft.     Tenderness: There is no abdominal tenderness.  Genitourinary:    Comments: Bimanual exam- no adnexal masses or tenderness, ovaries nonpalpable   No cervix present  Musculoskeletal:        General: No tenderness. Normal range  of motion.     Cervical back: Normal range of motion and neck supple.  Skin:    General: Skin is warm and dry.  Neurological:     Mental Status: She is alert and oriented to person, place, and time.     Cranial Nerves: No cranial nerve deficit.     Deep Tendon Reflexes: Reflexes are normal and symmetric.  Psychiatric:        Mood and Affect: Mood is anxious.        Behavior: Behavior normal.        Thought Content: Thought content normal.        Judgment: Judgment normal.          BP 127/84   Pulse 69   Temp (!) 96.4 F (35.8 C)   Ht 5' 1.5 (1.562 m)   Wt 164 lb 12.8 oz (74.8 kg)   SpO2 97%   BMI 30.63 kg/m   Assessment & Plan:  Brittany Lynch comes in today with chief complaint of No chief complaint on file.   Diagnosis and orders addressed:  1. Hyperlipidemia, unspecified hyperlipidemia type - atorvastatin  (LIPITOR) 20 MG tablet; Take 1 tablet (20 mg total) by mouth daily.  Dispense: 90 tablet; Refill: 1 - fenofibrate  (TRICOR ) 145 MG tablet; Take 1 tablet (145 mg total) by mouth daily.  Dispense: 90 tablet; Refill: 1 - CMP14+EGFR - CBC with Differential/Platelet - Lipid panel  2. RLS (restless legs syndrome) - pramipexole  (MIRAPEX ) 0.5 MG tablet; Take 2 tablets (1 mg total) by mouth every evening.  Dispense: 180 tablet; Refill: 0 - CMP14+EGFR - CBC with Differential/Platelet  3. Annual physical exam (Primary) - CMP14+EGFR - CBC with Differential/Platelet - Lipid panel - Cytology - PAP([place])  4. Primary hypertension - CMP14+EGFR - CBC with Differential/Platelet  5. Moderate episode of recurrent major depressive disorder (HCC)  - CMP14+EGFR - CBC with Differential/Platelet  6. GAD (generalized anxiety disorder) - CMP14+EGFR - CBC with Differential/Platelet  7. Hypothyroidism, unspecified type - CMP14+EGFR - CBC with Differential/Platelet - TSH  8. Chronic low back pain, unspecified back pain laterality, unspecified whether sciatica  present - CMP14+EGFR - CBC with Differential/Platelet  9. S/P lumbar laminectomy  - CMP14+EGFR - CBC with Differential/Platelet  10. Gastroesophageal reflux disease, unspecified whether esophagitis present - CMP14+EGFR - CBC with Differential/Platelet  11. Constipation, unspecified constipation type - CMP14+EGFR - CBC with Differential/Platelet  12. Gynecologic exam normal - CMP14+EGFR - CBC with Differential/Platelet - Cytology - PAP([place])  Labs pending Keep specialists follow up Continue current  medications  Health Maintenance reviewed Diet and exercise encouraged  Follow up plan: 6 months    Bari Learn, FNP

## 2024-02-03 ENCOUNTER — Ambulatory Visit: Payer: Self-pay | Admitting: Family

## 2024-02-03 LAB — CBC WITH DIFFERENTIAL/PLATELET
Basophils Absolute: 0.1 x10E3/uL (ref 0.0–0.2)
Basos: 1 %
EOS (ABSOLUTE): 0 x10E3/uL (ref 0.0–0.4)
Eos: 0 %
Hematocrit: 43.2 % (ref 34.0–46.6)
Hemoglobin: 14.1 g/dL (ref 11.1–15.9)
Immature Grans (Abs): 0 x10E3/uL (ref 0.0–0.1)
Immature Granulocytes: 0 %
Lymphocytes Absolute: 2.7 x10E3/uL (ref 0.7–3.1)
Lymphs: 38 %
MCH: 30.6 pg (ref 26.6–33.0)
MCHC: 32.6 g/dL (ref 31.5–35.7)
MCV: 94 fL (ref 79–97)
Monocytes Absolute: 0.5 x10E3/uL (ref 0.1–0.9)
Monocytes: 7 %
Neutrophils Absolute: 3.7 x10E3/uL (ref 1.4–7.0)
Neutrophils: 54 %
Platelets: 423 x10E3/uL (ref 150–450)
RBC: 4.61 x10E6/uL (ref 3.77–5.28)
RDW: 12.4 % (ref 11.7–15.4)
WBC: 7 x10E3/uL (ref 3.4–10.8)

## 2024-02-03 LAB — CMP14+EGFR
ALT: 18 IU/L (ref 0–32)
AST: 19 IU/L (ref 0–40)
Albumin: 4.9 g/dL (ref 3.9–4.9)
Alkaline Phosphatase: 42 IU/L — ABNORMAL LOW (ref 44–121)
BUN/Creatinine Ratio: 17 (ref 12–28)
BUN: 17 mg/dL (ref 8–27)
Bilirubin Total: 0.3 mg/dL (ref 0.0–1.2)
CO2: 25 mmol/L (ref 20–29)
Calcium: 10.4 mg/dL — ABNORMAL HIGH (ref 8.7–10.3)
Chloride: 99 mmol/L (ref 96–106)
Creatinine, Ser: 0.98 mg/dL (ref 0.57–1.00)
Globulin, Total: 2.6 g/dL (ref 1.5–4.5)
Glucose: 86 mg/dL (ref 70–99)
Potassium: 4.6 mmol/L (ref 3.5–5.2)
Sodium: 141 mmol/L (ref 134–144)
Total Protein: 7.5 g/dL (ref 6.0–8.5)
eGFR: 65 mL/min/1.73 (ref >59)

## 2024-02-03 LAB — TSH: TSH: 2.25 u[IU]/mL (ref 0.450–4.500)

## 2024-02-03 LAB — LIPID PANEL
Chol/HDL Ratio: 2.4 ratio (ref 0.0–4.4)
Cholesterol, Total: 196 mg/dL (ref 100–199)
HDL: 83 mg/dL (ref >39)
LDL Chol Calc (NIH): 95 mg/dL (ref 0–99)
Triglycerides: 106 mg/dL (ref 0–149)
VLDL Cholesterol Cal: 18 mg/dL (ref 5–40)

## 2024-02-06 LAB — CYTOLOGY - PAP: Diagnosis: NEGATIVE

## 2024-02-27 ENCOUNTER — Other Ambulatory Visit: Payer: Self-pay | Admitting: Internal Medicine

## 2024-02-27 DIAGNOSIS — M25551 Pain in right hip: Secondary | ICD-10-CM

## 2024-02-27 DIAGNOSIS — M159 Polyosteoarthritis, unspecified: Secondary | ICD-10-CM

## 2024-03-26 ENCOUNTER — Other Ambulatory Visit: Payer: Self-pay | Admitting: Family

## 2024-03-26 DIAGNOSIS — F411 Generalized anxiety disorder: Secondary | ICD-10-CM

## 2024-03-26 DIAGNOSIS — F331 Major depressive disorder, recurrent, moderate: Secondary | ICD-10-CM

## 2024-03-26 DIAGNOSIS — M5416 Radiculopathy, lumbar region: Secondary | ICD-10-CM | POA: Diagnosis not present

## 2024-04-02 ENCOUNTER — Other Ambulatory Visit: Payer: Self-pay | Admitting: Internal Medicine

## 2024-04-02 DIAGNOSIS — M25551 Pain in right hip: Secondary | ICD-10-CM

## 2024-04-02 DIAGNOSIS — M159 Polyosteoarthritis, unspecified: Secondary | ICD-10-CM

## 2024-04-02 NOTE — Telephone Encounter (Signed)
 Last Fill: 01/29/2024  Next Visit: 05/05/2024  Last Visit: 01/29/2024  Dx: Bilateral hip pain   Current Dose per office note on 01/29/2024: Flexeril  5 mg for nighttime use   Okay to refill Flexeril ?

## 2024-04-15 DIAGNOSIS — M5412 Radiculopathy, cervical region: Secondary | ICD-10-CM | POA: Diagnosis not present

## 2024-04-15 DIAGNOSIS — G894 Chronic pain syndrome: Secondary | ICD-10-CM | POA: Diagnosis not present

## 2024-04-15 DIAGNOSIS — Z9889 Other specified postprocedural states: Secondary | ICD-10-CM | POA: Diagnosis not present

## 2024-04-15 DIAGNOSIS — M5416 Radiculopathy, lumbar region: Secondary | ICD-10-CM | POA: Diagnosis not present

## 2024-04-21 ENCOUNTER — Other Ambulatory Visit: Payer: Self-pay | Admitting: Family

## 2024-04-21 DIAGNOSIS — K5900 Constipation, unspecified: Secondary | ICD-10-CM

## 2024-04-21 DIAGNOSIS — I10 Essential (primary) hypertension: Secondary | ICD-10-CM

## 2024-04-21 NOTE — Progress Notes (Signed)
 Office Visit Note  Patient: Brittany Lynch             Date of Birth: 1960-11-05           MRN: [id number]             PCP: Lavell Bari LABOR, FNP Referring: Lavell Bari LABOR, FNP Visit Date: 05/05/2024   Subjective:  Discussed the use of AI scribe software for clinical note transcription with the patient, who gave verbal consent to proceed.  History of Present Illness   Brittany Lynch is a 63 y.o. female here for follow up who presents with joint pain and stiffness with osteoarthritis on diclofenac  50 mg twice daily.   She experiences bony changes in her fingers, particularly at the distal interphalangeal joints, described as 'little balls'. 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  She has been experiencing hip pain, which previously made walking difficult and caused pain radiating down her legs. However, she notes improvement in her hip condition, stating that it feels better and is not as bothersome as before. She mentions a past incident where her hip felt like it was locking, but it eventually 'straightened up'.  She is currently taking diclofenac  twice a day in the morning and once at night as an anti-inflammatory medication. It has been helpful in managing her symptoms without any noticeable side effects such as stomach problems. She has been on this medication for a while and adheres to the prescribed regimen.  She experiences morning stiffness lasting about an hour, which improves after taking her medication and moving around. She also reports a persistent tremor in her hands, described as 'always shaking'.   Previous HPI 01/29/2024 Brittany Lynch is a 63 year old female who presents with joint pain and stiffness.   She has been experiencing joint pain and stiffness for at least a month, primarily located in her back and leg, with radiation down her leg. The pain is severe enough to  cause emotional distress and worsens at night, causing significant discomfort.   She has been taking ibuprofen , four tablets every morning and evening, but it does not alleviate her pain. She finds relief with a topical arthritis medication containing 1% lidocaine . She recalls being given diclofenac  in the past, possibly during a previous back surgery.   Her past medical history includes back surgery, and she has had an MRI of her lumbar spine, followed by back shots. She has not used muscle relaxers recently but has tried them in the past.      Previous HPI 12/30/23 Brittany Lynch is a 63 year old female with arthritis who presents with worsening joint pain in multiple areas and abnormal lab test results. She is accompanied by her granddaughter and husband.   She experiences significant pain in her hip area that radiates down her legs, with the left side being worse than the right. This pain has been present for about fifteen years and has worsened recently, especially affecting her ability to walk. She describes the pain as a constant 'annoying toothache' and notes that it improves after taking ibuprofen  and resting for about thirty minutes. She currently takes four ibuprofen  at night for her arthritis, which she finds somewhat helpful. She also takes oxycodone  prescribed for her back pain, though she feels it does not address her arthritis symptoms.   She has a history of back issues and neck  pain, which she describes as different from her current joint pain. She has had surgery for her back in the past but denies any specific joint injuries such as fractures or dislocations. No swelling in her legs and her joint pain is constant, not varying with time of day or activity level. No significant changes in her fingernails or toenails, although she mentions a past fungal infection in her nails.   She reports a rash in her left ear canal that has been persistent for a long time. She denies any treatment  with antibiotic or steroid drops for this issue. No recent serious infections or antibiotic use, although she occasionally gets sick but recovers without intervention.    Review of Systems  Constitutional:  Positive for fatigue.  HENT:  Negative for mouth sores and mouth dryness.   Eyes:  Negative for dryness.  Respiratory:  Negative for shortness of breath.   Cardiovascular:  Positive for palpitations. Negative for chest pain.  Gastrointestinal:  Negative for blood in stool, constipation and diarrhea.  Endocrine: Negative for increased urination.  Genitourinary:  Positive for involuntary urination.  Musculoskeletal:  Positive for joint pain, gait problem, joint pain, joint swelling, myalgias, morning stiffness, muscle tenderness and myalgias. Negative for muscle weakness.  Skin:  Negative for color change, rash, hair loss and sensitivity to sunlight.  Allergic/Immunologic: Negative for susceptible to infections.  Neurological:  Positive for headaches. Negative for dizziness.  Hematological:  Negative for swollen glands.  Psychiatric/Behavioral:  Positive for depressed mood. Negative for sleep disturbance. The patient is nervous/anxious.     PMFS History:  Patient Active Problem List   Diagnosis Date Noted   Bilateral hip pain 12/30/2023   Rheumatoid factor positive 12/30/2023   DJD (degenerative joint disease) 10/29/2022   Cervical radiculopathy 09/01/2021   Spinal stenosis in cervical region 09/01/2021   Iron deficiency 04/28/2021   S/P cervical spinal fusion 02/08/2021   Neck pain 01/04/2021   Thrombocytosis 12/09/2020   Constipation 07/26/2020   RLS (restless legs syndrome) 07/26/2020   GERD (gastroesophageal reflux disease) 08/14/2018   S/P lumbar laminectomy 03/06/2018   Vitamin D  deficiency 10/18/2017   Hypertriglyceridemia 10/18/2017   History of colonic polyps 12/14/2015   GAD (generalized anxiety disorder) 09/20/2014   Hypothyroidism 09/20/2014   Chronic lower back  pain 09/20/2014   Lumbosacral neuritis 06/17/2014   Lumbosacral stenosis 01/28/2014   Depression 01/25/2014   Continuous opioid dependence (HCC) 12/14/2013   Snoring 12/14/2013   Spinal stenosis of lumbar region 11/06/2013   Lumbar radiculopathy 11/04/2013   Hypertension 09/18/2013   Hyperlipidemia 09/18/2013   Lumbosacral radiculitis 08/17/2013    Past Medical History:  Diagnosis Date   Anemia    Anxiety    Arthritis    spine/neck   Depression    GERD (gastroesophageal reflux disease)    Headache    Hyperlipidemia    Hypertension    Hypothyroidism    Iron deficiency 04/28/2021   Thyroid  disease     Family History  Problem Relation Age of Onset   Mental illness Mother    Alcohol abuse Father    Diabetes Father    Heart disease Father    Psoriasis Daughter    Arthritis Daughter        Psoriatic   Psoriasis Niece    Arthritis Niece        Psoriatic   Breast cancer Neg Hx    Past Surgical History:  Procedure Laterality Date   ABDOMINAL HYSTERECTOMY     ANTERIOR  CERVICAL DECOMP/DISCECTOMY FUSION N/A 02/08/2021   Procedure: CERVICAL FOUR-FIVE ANTERIOR CERVICAL DECOMPRESSION/DISCECTOMY FUSION;  Surgeon: Joshua Alm RAMAN, MD;  Location: [facility];  Service: Neurosurgery;  Laterality: N/A;  3C   BACK SURGERY     CATARACT EXTRACTION Bilateral    EYE SURGERY     LUMBAR LAMINECTOMY/DECOMPRESSION MICRODISCECTOMY Bilateral 03/06/2018   Procedure: Laminectomy and Foraminotomy - Lumbar Two-Three, Three-Four bilateral;  Surgeon: Joshua Alm RAMAN, MD;  Location: [facility];  Service: Neurosurgery;  Laterality: Bilateral;  Laminectomy and Foraminotomy - Lumbar Two-Three, Three-Four bilateral   NECK SURGERY     Right hand and wrist surgery Right    Laceration    SPINE SURGERY     Social History   Social History Narrative   Lives in apartment basement of daughters home    Immunization History  Administered Date(s) Administered   H1N1 06/01/2008   Influenza Split 07/17/2011, 04/26/2013,  04/19/2015   Influenza, Seasonal, Injecte, Preservative Fre 08/01/2023   Influenza,inj,Quad PF,6+ Mos 05/06/2017, 08/14/2018, 07/26/2020, 04/28/2021, 05/28/2022   Influenza-Unspecified 07/17/2011, 01/28/2023, 01/29/2023   Moderna SARS-COV2 Booster Vaccination 09/14/2020   Moderna Sars-Covid-2 Vaccination 11/08/2019, 12/06/2019   Novel Infuenza-h1n1-09 06/01/2008   Tdap 09/02/2012, 10/29/2022   Zoster Recombinant(Shingrix ) 07/24/2021, 09/26/2021     Objective: Vital Signs: BP 132/80   Pulse 61   Temp (!) 96.2 F (35.7 C)   Resp 16   Ht 5' 1.5 (1.562 m)   Wt 166 lb 9.6 oz (75.6 kg)   BMI 30.97 kg/m    Physical Exam Eyes:     Conjunctiva/sclera: Conjunctivae normal.  Cardiovascular:     Rate and Rhythm: Normal rate and regular rhythm.  Pulmonary:     Effort: Pulmonary effort is normal.     Breath sounds: Normal breath sounds.  Musculoskeletal:     Right lower leg: No edema.     Left lower leg: No edema.  Skin:    General: Skin is warm and dry.  Neurological:     Mental Status: She is alert.  Psychiatric:        Mood and Affect: Mood normal.      Musculoskeletal Exam:  Shoulders full ROM no tenderness or swelling Elbows full ROM no tenderness or swelling Wrists full ROM no tenderness or swelling, right wrist scarring Fingers full ROM no tenderness or swelling, DIP heberdon's nodes at index fingers Hip normal internal and external rotation without pain, no tenderness to lateral hip palpation Knees full ROM no tenderness or swelling Ankles full ROM no tenderness or swelling  Investigation: No additional findings.  Imaging: No results found.  Recent Labs: Lab Results  Component Value Date   WBC 7.0 01/31/2024   HGB 14.1 01/31/2024   PLT 423 01/31/2024   NA 137 05/05/2024   K 4.2 05/05/2024   CL 98 05/05/2024   CO2 29 05/05/2024   GLUCOSE 79 05/05/2024   BUN 19 05/05/2024   CREATININE 0.91 05/05/2024   BILITOT 0.3 01/31/2024   ALKPHOS 42 (L) 01/31/2024    AST 19 01/31/2024   ALT 18 01/31/2024   PROT 7.5 01/31/2024   ALBUMIN 4.9 01/31/2024   CALCIUM  9.8 05/05/2024   GFRAA 90 07/26/2020    Speciality Comments: No specialty comments available.  Procedures:  No procedures performed Allergies: Chocolate, Iodinated contrast media, Methotrexate and trimetrexate, Lisinopril , Latex, and Tape   Assessment / Plan:     Visit Diagnoses: Osteoarthritis of multiple joints, unspecified osteoarthritis type  Bilateral hip pain - Plan: diclofenac  (VOLTAREN ) 50 MG EC  tablet, cyclobenzaprine  (FLEXERIL ) 5 MG tablet, Basic Metabolic Panel (BMET) oint pain and stiffness with negative rheumatoid factor and normal inflammatory markers, suggesting non-inflammatory causes.  Again with no peripheral joint inflammatory changes on exam.  Getting some additional relief with the switch to diclofenac  compared to over-the-counter ibuprofen . - Continue diclofenac  50 mg twice daily - Continue Flexeril  5 mg at night as needed - Checking a basic metabolic panel for monitoring continuing on daily oral NSAIDs   de effects. - Reassess in six months.  Abnormal immunological findings in serum (positive rheumatoid factor) Positive rheumatoid factor without active RA symptoms. Diclofenac  managing mild symptoms.   Orders: Orders Placed This Encounter  Procedures   Basic Metabolic Panel (BMET)   Meds ordered this encounter  Medications   diclofenac  (VOLTAREN ) 50 MG EC tablet    Sig: Take 1 tablet (50 mg total) by mouth 2 (two) times daily as needed.    Dispense:  180 tablet    Refill:  1   cyclobenzaprine  (FLEXERIL ) 5 MG tablet    Sig: Take 1 tablet (5 mg total) by mouth at bedtime as needed.    Dispense:  90 tablet    Refill:  1     Follow-Up Instructions: Return in about 6 months (around 11/03/2024) for OA on NSAID f/u 6mos.   Lonni LELON Ester, MD  Note - This record has been created using [employer].  Chart creation errors have been sought, but may  not always  have been located. Such creation errors do not reflect on  the standard of medical care.

## 2024-05-05 ENCOUNTER — Encounter: Payer: Self-pay | Admitting: Internal Medicine

## 2024-05-05 ENCOUNTER — Ambulatory Visit: Attending: Internal Medicine | Admitting: Internal Medicine

## 2024-05-05 VITALS — BP 132/80 | HR 61 | Temp 96.2°F | Resp 16 | Ht 61.5 in | Wt 166.6 lb

## 2024-05-05 DIAGNOSIS — M25551 Pain in right hip: Secondary | ICD-10-CM | POA: Diagnosis not present

## 2024-05-05 DIAGNOSIS — M159 Polyosteoarthritis, unspecified: Secondary | ICD-10-CM

## 2024-05-05 DIAGNOSIS — R7689 Other specified abnormal immunological findings in serum: Secondary | ICD-10-CM | POA: Diagnosis not present

## 2024-05-05 DIAGNOSIS — M25552 Pain in left hip: Secondary | ICD-10-CM

## 2024-05-05 LAB — BASIC METABOLIC PANEL WITH GFR
BUN: 19 mg/dL (ref 7–25)
CO2: 29 mmol/L (ref 20–32)
Calcium: 9.8 mg/dL (ref 8.6–10.4)
Chloride: 98 mmol/L (ref 98–110)
Creat: 0.91 mg/dL (ref 0.50–1.05)
Glucose, Bld: 79 mg/dL (ref 65–99)
Potassium: 4.2 mmol/L (ref 3.5–5.3)
Sodium: 137 mmol/L (ref 135–146)
eGFR: 70 mL/min/1.73m2 (ref >=60)

## 2024-05-05 MED ORDER — CYCLOBENZAPRINE HCL 5 MG PO TABS: 5.0000 mg | ORAL_TABLET | Freq: Every evening | ORAL | 1 refills | Status: AC | PRN

## 2024-05-05 MED ORDER — DICLOFENAC SODIUM 50 MG PO TBEC: 50.0000 mg | DELAYED_RELEASE_TABLET | Freq: Two times a day (BID) | ORAL | 1 refills | Status: AC | PRN

## 2024-05-07 ENCOUNTER — Other Ambulatory Visit: Payer: Self-pay | Admitting: Physician Assistant

## 2024-05-07 DIAGNOSIS — E538 Deficiency of other specified B group vitamins: Secondary | ICD-10-CM

## 2024-05-07 MED ORDER — CYANOCOBALAMIN 1000 MCG/ML IJ SOLN: 1000.0000 ug | INTRAMUSCULAR | 1 refills | Status: DC

## 2024-05-08 ENCOUNTER — Other Ambulatory Visit: Payer: Self-pay | Admitting: Family

## 2024-05-08 ENCOUNTER — Other Ambulatory Visit: Payer: Self-pay | Admitting: Family Medicine

## 2024-05-08 DIAGNOSIS — M25551 Pain in right hip: Secondary | ICD-10-CM

## 2024-05-08 DIAGNOSIS — E538 Deficiency of other specified B group vitamins: Secondary | ICD-10-CM

## 2024-05-08 DIAGNOSIS — E785 Hyperlipidemia, unspecified: Secondary | ICD-10-CM

## 2024-05-08 DIAGNOSIS — N951 Menopausal and female climacteric states: Secondary | ICD-10-CM

## 2024-05-08 DIAGNOSIS — I10 Essential (primary) hypertension: Secondary | ICD-10-CM

## 2024-05-08 DIAGNOSIS — F411 Generalized anxiety disorder: Secondary | ICD-10-CM

## 2024-05-08 DIAGNOSIS — F331 Major depressive disorder, recurrent, moderate: Secondary | ICD-10-CM

## 2024-05-08 DIAGNOSIS — M159 Polyosteoarthritis, unspecified: Secondary | ICD-10-CM

## 2024-05-08 MED ORDER — FENOFIBRATE 145 MG PO TABS: 145.0000 mg | ORAL_TABLET | Freq: Every day | ORAL | 1 refills | Status: DC

## 2024-05-08 MED ORDER — ESTRADIOL 2 MG PO TABS: 2.0000 mg | ORAL_TABLET | Freq: Every day | ORAL | 3 refills | Status: AC

## 2024-05-08 MED ORDER — ATORVASTATIN CALCIUM 20 MG PO TABS: 20.0000 mg | ORAL_TABLET | Freq: Every day | ORAL | 1 refills | Status: DC

## 2024-05-08 MED ORDER — VITAMIN D (ERGOCALCIFEROL) 1.25 MG (50000 UNIT) PO CAPS: 50000.0000 [IU] | ORAL_CAPSULE | ORAL | 1 refills | Status: AC

## 2024-05-08 MED ORDER — METOPROLOL TARTRATE 50 MG PO TABS: 50.0000 mg | ORAL_TABLET | Freq: Two times a day (BID) | ORAL | 0 refills | Status: DC

## 2024-05-08 MED ORDER — BUPROPION HCL ER (XL) 300 MG PO TB24: 300.0000 mg | ORAL_TABLET | Freq: Every day | ORAL | 1 refills | Status: DC

## 2024-05-08 NOTE — Telephone Encounter (Signed)
Already sent in by another provider.

## 2024-05-08 NOTE — Telephone Encounter (Signed)
 Copied from CRM [id number]. Topic: Clinical - Prescription Issue >> May 08, 2024 11:37 AM Marda MATSU wrote: Medication request per [facility] with [facility] stated the rx has not been received.  (Although E-Prescribing status says confirmed by pharmacy on 05/05/2024)  She ask if the Rx can be faxed to: Fax # [phone] for the following medications  cyanocobalamin  (VITAMIN B12) 1000 MCG/ML injection [2007  diclofenac  (VOLTAREN ) 50 MG EC tablet [[id number]

## 2024-05-08 NOTE — Telephone Encounter (Signed)
 Copied from CRM [id number]. Topic: Clinical - Medication Question >> May 08, 2024 11:50 AM Harlene ORN wrote: Reason for CRM: [facility] - [facility] Sent an Prescription request on 05/06/2024 for Fenofiprate 145 mg tablets, Linzess  145 MCG capsules, Vitamin D2 1.25 MG, Estradiol  2MG  tablets, Pupropion 300 MG, Levothyroxine  100 MCG tablet, Atorvastatin  20 MG tablet, Desvenlasaxine 100 MG, Metropolol 50 MG tablet, Omeprezole 40 MG, and Pramipexole  0.5 MG.  Wants to know if this was received, and the status of them being filled. Phone: [phone]

## 2024-06-03 ENCOUNTER — Ambulatory Visit (INDEPENDENT_AMBULATORY_CARE_PROVIDER_SITE_OTHER)

## 2024-06-03 DIAGNOSIS — Z23 Encounter for immunization: Secondary | ICD-10-CM | POA: Diagnosis not present

## 2024-07-07 ENCOUNTER — Other Ambulatory Visit: Payer: Self-pay | Admitting: Family

## 2024-07-07 DIAGNOSIS — G2581 Restless legs syndrome: Secondary | ICD-10-CM

## 2024-07-07 DIAGNOSIS — K219 Gastro-esophageal reflux disease without esophagitis: Secondary | ICD-10-CM

## 2024-07-07 MED ORDER — OMEPRAZOLE 40 MG PO CPDR: 40.0000 mg | DELAYED_RELEASE_CAPSULE | Freq: Every day | ORAL | 1 refills | Status: DC

## 2024-07-07 MED ORDER — PRAMIPEXOLE DIHYDROCHLORIDE 0.5 MG PO TABS: 1.0000 mg | ORAL_TABLET | Freq: Every evening | ORAL | 0 refills | Status: DC

## 2024-07-07 NOTE — Telephone Encounter (Unsigned)
 Copied from CRM #[id number]. Topic: Clinical - Medication Refill >> Jul 07, 2024 11:15 AM Tobias L wrote: Medication: omeprazole  (PRILOSEC) 40 MG capsule pramipexole  (MIRAPEX ) 0.5 MG tablet  Has the patient contacted their pharmacy? Yes Pharmacy calling on behalf of patient.   This is the patient's preferred pharmacy:  [facility] - [place], [place] - [address] [place] [address] Phone: [phone] Fax: [phone]  Is this the correct pharmacy for this prescription? Yes If no, delete pharmacy and type the correct one.   Has the prescription been filled recently? No  Is the patient out of the medication? Yes  Has the patient been seen for an appointment in the last year OR does the patient have an upcoming appointment? Yes  Can we respond through MyChart? No  Agent: Please be advised that Rx refills may take up to 3 business days. We ask that you follow-up with your pharmacy.

## 2024-07-18 ENCOUNTER — Other Ambulatory Visit: Payer: Self-pay | Admitting: Family

## 2024-07-18 DIAGNOSIS — E785 Hyperlipidemia, unspecified: Secondary | ICD-10-CM

## 2024-07-21 ENCOUNTER — Inpatient Hospital Stay: Payer: 59

## 2024-07-21 ENCOUNTER — Other Ambulatory Visit

## 2024-07-21 DIAGNOSIS — F331 Major depressive disorder, recurrent, moderate: Secondary | ICD-10-CM

## 2024-07-21 DIAGNOSIS — D509 Iron deficiency anemia, unspecified: Secondary | ICD-10-CM

## 2024-07-21 DIAGNOSIS — D75839 Thrombocytosis, unspecified: Secondary | ICD-10-CM

## 2024-07-21 DIAGNOSIS — F411 Generalized anxiety disorder: Secondary | ICD-10-CM

## 2024-07-21 DIAGNOSIS — K219 Gastro-esophageal reflux disease without esophagitis: Secondary | ICD-10-CM

## 2024-07-21 DIAGNOSIS — E538 Deficiency of other specified B group vitamins: Secondary | ICD-10-CM

## 2024-07-21 DIAGNOSIS — G2581 Restless legs syndrome: Secondary | ICD-10-CM

## 2024-07-21 DIAGNOSIS — E039 Hypothyroidism, unspecified: Secondary | ICD-10-CM

## 2024-07-21 NOTE — Addendum Note (Signed)
 Addended by: ENEDINA ADELITA SAILOR on: 07/21/2024 10:49 AM   Modules accepted: Orders

## 2024-07-22 LAB — CBC WITH DIFFERENTIAL/PLATELET
Basophils Absolute: 0.1 x10E3/uL (ref 0.0–0.2)
Basos: 1 %
EOS (ABSOLUTE): 0.1 x10E3/uL (ref 0.0–0.4)
Eos: 1 %
Hematocrit: 42.9 % (ref 34.0–46.6)
Hemoglobin: 13.9 g/dL (ref 11.1–15.9)
Immature Grans (Abs): 0 x10E3/uL (ref 0.0–0.1)
Immature Granulocytes: 0 %
Lymphocytes Absolute: 2.2 x10E3/uL (ref 0.7–3.1)
Lymphs: 25 %
MCH: 29.8 pg (ref 26.6–33.0)
MCHC: 32.4 g/dL (ref 31.5–35.7)
MCV: 92 fL (ref 79–97)
Monocytes Absolute: 0.9 x10E3/uL (ref 0.1–0.9)
Monocytes: 10 %
Neutrophils Absolute: 5.5 x10E3/uL (ref 1.4–7.0)
Neutrophils: 62 %
Platelets: 482 x10E3/uL — ABNORMAL HIGH (ref 150–450)
RBC: 4.67 x10E6/uL (ref 3.77–5.28)
RDW: 12.3 % (ref 11.7–15.4)
WBC: 8.7 x10E3/uL (ref 3.4–10.8)

## 2024-07-22 LAB — IRON AND TIBC
Iron Saturation: 15 % (ref 15–55)
Iron: 61 ug/dL (ref 27–139)
Total Iron Binding Capacity: 407 ug/dL (ref 250–450)
UIBC: 346 ug/dL (ref 118–369)

## 2024-07-22 LAB — VITAMIN B12: Vitamin B-12: 691 pg/mL (ref 232–1245)

## 2024-07-22 LAB — FERRITIN: Ferritin: 239 ng/mL — AB (ref 15–150)

## 2024-07-22 LAB — METHYLMALONIC ACID, SERUM: Methylmalonic Acid: 194 nmol/L (ref 0–378)

## 2024-07-27 NOTE — Progress Notes (Unsigned)
 "  [facility] [address], [place] [address]   CLINIC:  Medical Oncology/Hematology  PCP:  Lavell Bari LABOR, FNP [address] [place] [address] [phone]   REASON FOR VISIT:  Follow-up for iron deficiency anemia and thrombocytosis   PRIOR THERAPY: Oral iron supplementation   CURRENT THERAPY: Intermittent IV iron  INTERVAL HISTORY:   [name] 64 y.o. female returns for routine follow-up of iron deficiency anemia and thrombocytosis.   She was last seen by Pleasant Barefoot PA-C on 07/29/2023. Last IV iron with Feraheme  x 2 in January 2025.  At today's visit, she reports feeling fair. She denies any recent hospitalizations, surgeries, or new diagnoses.  She has 20% energy and 100% appetite.  She endorses that she is maintaining a stable weight.  She does report some decreased energy over the past few months, which had previously improved after IV iron.   She reports some chronic cravings for ice.   Restless leg symptoms have improved on pramipexole .   She denies any lightheadedness, dyspnea on exertion, or chest pain.   She reports occasional scant rectal bleeding related to constipation, but denies any gross hematochezia or melena.  She reports some general itchiness and occasional aquagenic pruritus.   She has some intermittent blurry vision and difficulty adjusting vision focus that she attributes to her astigmatism. She denies any other vasomotor symptoms or erythromelalgia.   No DVT or PE. She has been vaping about once a day using a weed dab since November 2025. 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  She self administers monthly B12 injection at home.   ASSESSMENT & PLAN:  1.   Thrombocytosis - Referred to hematology for initial consult with Dr. Loretha on 12/09/2020. - No previous history of blood clots, no current signs or symptoms of DVT or PE   - Review of past labs shows intermittent  thrombocytosis since January 2020, with maximum platelets 536 - MPN work-up negative for JAK2, CALR, or MPL mutation.   - Platelets had previously improved after iron repletion, but most recent labs (07/21/2024) show elevated platelets 482, but with normal iron. - She has been vaping about once a day using a weed dab since November 2025. 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 - PLAN: No evidence of MPN.  Suspect some mild reactive thrombocytosis due to vaping and inflammation (arthritis).. - Continue annual follow-up or sooner if needed.     2.  Iron deficiency state without anemia - Initial work-up (12/09/2020): Iron deficiency with ferritin 12, iron saturation 9% - Colonoscopy (02/06/2023): Mild diverticulosis.  Residual stool despite aggressive prep.  Gastroenterologist felt they were confidently able to rule out lesions >5 mm, but are planning for repeat colonoscopy in 5 years. - Patient was started on oral iron supplementation (ferrous sulfate ) at her last visit (01/06/2021), which caused some constipation - Repeat iron panel (04/25/2021): Persistent iron deficiency with ferritin 22, iron saturation 13%, elevated TIBC 572 - Patient received Feraheme  in October 2022 and January 2025 (steroid premedication given due to multiple medication allergies) - Patient denies any signs or symptoms of blood loss such as epistaxis, hematemesis, hematochezia, or melena. - Most recent labs (07/21/2024): Hgb 13.9/MCV 92, ferritin 239, iron saturation 15% with normal TIBC - Suspect malabsorption, possibly due to daily PPI use  - PLAN: No indication for IV iron at this time.  Repeat CBC/iron panel and RTC in 1 year.     3.  Borderline B12 deficiency - B12 checked on 06/27/2021 was borderline at 293, with normal methylmalonic acid - She is taking OTC vitamin B12 supplement daily, but without improvement in B12 levels - She reports family history of grandmother not absorbing B12 -  Taking B12 injections monthly at home since January 2025. - Most recent labs (07/21/2024): Normal vitamin B12 691, normal MMA - PLAN: Continue monthly B12 injections to be given at home by patient's daughter. - Recheck B12/MMA in 1 year   PLAN SUMMARY: >> Refill Rx to pharmacy for monthly B12 injections administered at home >> Labs in 1 year (CBC/D, ferritin, iron/TIBC, B12, MMA) >> Office visit in 1 year, 1 week after labs      REVIEW OF SYSTEMS:   Review of Systems  Constitutional:  Positive for fatigue. Negative for appetite change, chills, diaphoresis, fever and unexpected weight change.  HENT:   Negative for lump/mass, nosebleeds and trouble swallowing.   Eyes:  Negative for eye problems.  Respiratory:  Negative for cough, hemoptysis and shortness of breath.   Cardiovascular:  Negative for chest pain, leg swelling and palpitations.  Gastrointestinal:  Negative for abdominal pain, blood in stool, constipation, diarrhea, nausea and vomiting.  Genitourinary:  Negative for hematuria.   Musculoskeletal:  Positive for back pain and myalgias.  Skin: Negative.   Neurological:  Negative for dizziness, headaches, light-headedness and numbness.  Hematological:  Does not bruise/bleed easily.  Psychiatric/Behavioral:  Positive for sleep disturbance.      PHYSICAL EXAM:  ECOG PERFORMANCE STATUS: 1 - Symptomatic but completely ambulatory  Vitals:   07/28/24 0955 07/28/24 1000  BP: (!) 154/102 (!) 156/93  Pulse: 70   Resp: 16   Temp: 97.6 F (36.4 C)   SpO2: 98%     Filed Weights   07/28/24 0945 07/28/24 0955  Weight: 168 lb 6.9 oz (76.4 kg) 168 lb (76.2 kg)    Physical Exam Constitutional:      Appearance: Normal appearance. She is normal weight.  Cardiovascular:     Heart sounds: Normal heart sounds.  Pulmonary:     Breath sounds: Normal breath sounds.  Neurological:     General: No focal deficit present.     Mental Status: Mental status is at baseline.  Psychiatric:         Behavior: Behavior normal. Behavior is cooperative.     PAST MEDICAL/SURGICAL HISTORY:  Past Medical History:  Diagnosis Date   Anemia    Anxiety    Arthritis    spine/neck   Depression    GERD (gastroesophageal reflux disease)    Headache    Hyperlipidemia    Hypertension    Hypothyroidism    Iron deficiency 04/28/2021   Thyroid  disease    Past Surgical History:  Procedure Laterality Date   ABDOMINAL HYSTERECTOMY     ANTERIOR CERVICAL DECOMP/DISCECTOMY FUSION N/A 02/08/2021   Procedure: CERVICAL FOUR-FIVE ANTERIOR CERVICAL DECOMPRESSION/DISCECTOMY FUSION;  Surgeon: Joshua Alm RAMAN, MD;  Location: [facility] OR;  Service: Neurosurgery;  Laterality: N/A;  3C   BACK SURGERY     CATARACT EXTRACTION Bilateral    EYE SURGERY     LUMBAR LAMINECTOMY/DECOMPRESSION MICRODISCECTOMY Bilateral 03/06/2018   Procedure: Laminectomy and Foraminotomy - Lumbar Two-Three, Three-Four bilateral;  Surgeon: Joshua Alm RAMAN, MD;  Location: [facility] OR;  Service: Neurosurgery;  Laterality: Bilateral;  Laminectomy and Foraminotomy - Lumbar Two-Three, Three-Four bilateral   NECK SURGERY     Right hand and wrist surgery Right    Laceration    SPINE  SURGERY      SOCIAL HISTORY:  Social History   Socioeconomic History   Marital status: Legally Separated    Spouse name: Not on file   Number of children: 3   Years of education: Not on file   Highest education level: 9th grade  Occupational History   Occupation: Retired  Tobacco Use   Smoking status: Former    Current packs/day: 0.00    Types: Cigarettes    Quit date: 07/16/2001    Years since quitting: 23.0    Passive exposure: Current   Smokeless tobacco: Never  Vaping Use   Vaping status: Some Days   Devices: THC pen  Substance and Sexual Activity   Alcohol use: No    Comment: hx alcoholism. none since age 77   Drug use: Yes    Types: Marijuana   Sexual activity: Yes  Other Topics Concern   Not on file  Social History Narrative   Lives in  apartment basement of daughters home    Social Drivers of Health   Tobacco Use: Medium Risk (05/05/2024)   Patient History    Smoking Tobacco Use: Former    Smokeless Tobacco Use: Never    Passive Exposure: Current  Physicist, Medical Strain: High Risk (01/31/2024)   Overall Financial Resource Strain (CARDIA)    Difficulty of Paying Living Expenses: Hard  Food Insecurity: Food Insecurity Present (01/31/2024)   Epic    Worried About Programme Researcher, Broadcasting/film/video in the Last Year: Often true    Ran Out of Food in the Last Year: Often true  Transportation Needs: No Transportation Needs (01/31/2024)   Epic    Lack of Transportation (Medical): No    Lack of Transportation (Non-Medical): No  Physical Activity: Insufficiently Active (09/09/2023)   Exercise Vital Sign    Days of Exercise per Week: 3 days    Minutes of Exercise per Session: 30 min  Stress: No Stress Concern Present (09/09/2023)   [employer] of Occupational Health - Occupational Stress Questionnaire    Feeling of Stress : Not at all  Social Connections: Socially Isolated (01/31/2024)   Social Connection and Isolation Panel    Frequency of Communication with Friends and Family: More than three times a week    Frequency of Social Gatherings with Friends and Family: Once a week    Attends Religious Services: Never    Database Administrator or Organizations: No    Attends Engineer, Structural: Not on file    Marital Status: Separated  Intimate Partner Violence: Not At Risk (09/09/2023)   Humiliation, Afraid, Rape, and Kick questionnaire    Fear of Current or Ex-Partner: No    Emotionally Abused: No    Physically Abused: No    Sexually Abused: No  Depression (PHQ2-9): Low Risk (07/28/2024)   Depression (PHQ2-9)    PHQ-2 Score: 0  Alcohol Screen: Low Risk (09/09/2023)   Alcohol Screen    Last Alcohol Screening Score (AUDIT): 0  Housing: Unknown (01/31/2024)   Epic    Unable to Pay for Housing in the Last Year: No     Number of Times Moved in the Last Year: Not on file    Homeless in the Last Year: No  Utilities: Not At Risk (09/09/2023)   AHC Utilities    Threatened with loss of utilities: No  Health Literacy: Adequate Health Literacy (09/09/2023)   B1300 Health Literacy    Frequency of need for help with medical instructions: Never  FAMILY HISTORY:  Family History  Problem Relation Age of Onset   Mental illness Mother    Alcohol abuse Father    Diabetes Father    Heart disease Father    Psoriasis Daughter    Arthritis Daughter        Psoriatic   Psoriasis Niece    Arthritis Niece        Psoriatic   Breast cancer Neg Hx     CURRENT MEDICATIONS:  Outpatient Encounter Medications as of 07/28/2024  Medication Sig   atorvastatin  (LIPITOR) 20 MG tablet TAKE ONE TABLET BY MOUTH EVERY DAY   buPROPion  (WELLBUTRIN  XL) 300 MG 24 hr tablet TAKE ONE TABLET BY MOUTH EVERY DAY   chlorhexidine  (PERIDEX ) 0.12 % solution 15 mLs 2 (two) times daily.   cyclobenzaprine  (FLEXERIL ) 5 MG tablet Take 1 tablet (5 mg total) by mouth at bedtime as needed.   desvenlafaxine  (PRISTIQ ) 100 MG 24 hr tablet Take 1 tablet (100 mg total) by mouth daily.   diazepam (VALIUM) 5 MG tablet When she has back inj   diclofenac  (VOLTAREN ) 50 MG EC tablet Take 1 tablet (50 mg total) by mouth 2 (two) times daily as needed.   diphenhydrAMINE (BENADRYL) 25 mg capsule Take 25 mg by mouth every 6 (six) hours as needed for itching (after neck injections).   estradiol  (ESTRACE ) 2 MG tablet Take 1 tablet (2 mg total) by mouth daily.   fenofibrate  (TRICOR ) 145 MG tablet TAKE ONE TABLET BY MOUTH EVERY DAY   ferrous sulfate  324 MG TBEC Take 324 mg by mouth.   levothyroxine  (SYNTHROID ) 100 MCG tablet Take 1 tablet (100 mcg total) by mouth daily.   linaclotide  (LINZESS ) 145 MCG CAPS capsule Take 1 capsule (145 mcg total) by mouth daily before breakfast.   LINZESS  72 MCG capsule Take by mouth.   metoprolol  tartrate (LOPRESSOR ) 50 MG tablet Take  1 tablet (50 mg total) by mouth 2 (two) times daily.   omeprazole  (PRILOSEC) 40 MG capsule Take 1 capsule (40 mg total) by mouth daily.   Oxycodone  HCl 10 MG TABS Take 10 mg by mouth every 6 (six) hours as needed for severe pain.   pramipexole  (MIRAPEX ) 0.5 MG tablet Take 2 tablets (1 mg total) by mouth every evening.   Vitamin D , Ergocalciferol , (DRISDOL ) 1.25 MG (50000 UNIT) CAPS capsule Take 1 capsule (50,000 Units total) by mouth every 7 (seven) days.   [DISCONTINUED] cyanocobalamin  (VITAMIN B12) 1000 MCG/ML injection Inject 1 mL (1,000 mcg total) into the muscle every 30 (thirty) days.   [DISCONTINUED] Needles & Syringes MISC 1 each by Does not apply route every 30 (thirty) days. Use for monthly B12 injections.   cyanocobalamin  (VITAMIN B12) 1000 MCG/ML injection Inject 1 mL (1,000 mcg total) into the muscle every 30 (thirty) days.   Needles & Syringes MISC 1 each by Does not apply route every 30 (thirty) days. Use for monthly B12 injections.   No facility-administered encounter medications on file as of 07/28/2024.    ALLERGIES:  Allergies  Allergen Reactions   Chocolate Shortness Of Breath and Rash   Iodinated Contrast Media Anaphylaxis, Shortness Of Breath and Rash    Can tolerate with Benadryl injection    Methotrexate And Trimetrexate Shortness Of Breath and Rash   Lisinopril  Cough   Iodine     Other Reaction(s): Trouble Breathing   Latex    Tape Rash and Other (See Comments)    Paper tape only.    LABORATORY DATA:  I have  reviewed the labs as listed.  CBC    Component Value Date/Time   WBC 8.7 07/21/2024 1051   WBC 7.6 07/22/2023 1018   RBC 4.67 07/21/2024 1051   RBC 4.64 07/22/2023 1018   HGB 13.9 07/21/2024 1051   HCT 42.9 07/21/2024 1051   PLT 482 (H) 07/21/2024 1051   MCV 92 07/21/2024 1051   MCH 29.8 07/21/2024 1051   MCH 29.1 07/22/2023 1018   MCHC 32.4 07/21/2024 1051   MCHC 33.3 07/22/2023 1018   RDW 12.3 07/21/2024 1051   LYMPHSABS 2.2 07/21/2024 1051    MONOABS 0.5 07/22/2023 1018   EOSABS 0.1 07/21/2024 1051   BASOSABS 0.1 07/21/2024 1051      Latest Ref Rng & Units 05/05/2024    2:40 PM 01/31/2024    2:42 PM 07/22/2023   10:18 AM  CMP  Glucose 65 - 99 mg/dL 79  86  840   BUN 7 - 25 mg/dL 19  17  14    Creatinine 0.50 - 1.05 mg/dL 9.08  9.01  9.10   Sodium 135 - 146 mmol/L 137  141  137   Potassium 3.5 - 5.3 mmol/L 4.2  4.6  4.0   Chloride 98 - 110 mmol/L 98  99  98   CO2 20 - 32 mmol/L 29  25  29    Calcium  8.6 - 10.4 mg/dL 9.8  89.5  9.9   Total Protein 6.0 - 8.5 g/dL  7.5  7.4   Total Bilirubin 0.0 - 1.2 mg/dL  0.3  0.2   Alkaline Phos 44 - 121 IU/L  42  41   AST 0 - 40 IU/L  19  18   ALT 0 - 32 IU/L  18  18     DIAGNOSTIC IMAGING:  I have independently reviewed the relevant imaging and discussed with the patient.   WRAP UP:  All questions were answered. The patient knows to call the clinic with any problems, questions or concerns.  Medical decision making: Moderate  Time spent on visit: I spent 20 minutes counseling the patient face to face. The total time spent in the appointment was 30 minutes and more than 50% was on counseling.  Pleasant CHRISTELLA Barefoot, PA-C  07/28/2024 11:10 AM  "

## 2024-07-28 ENCOUNTER — Inpatient Hospital Stay: Payer: 59 | Attending: Physician Assistant | Admitting: Physician Assistant

## 2024-07-28 VITALS — BP 156/93 | HR 70 | Temp 97.6°F | Resp 16 | Wt 168.0 lb

## 2024-07-28 DIAGNOSIS — D75839 Thrombocytosis, unspecified: Secondary | ICD-10-CM | POA: Diagnosis not present

## 2024-07-28 DIAGNOSIS — E538 Deficiency of other specified B group vitamins: Secondary | ICD-10-CM | POA: Diagnosis not present

## 2024-07-28 DIAGNOSIS — D509 Iron deficiency anemia, unspecified: Secondary | ICD-10-CM | POA: Diagnosis not present

## 2024-07-28 MED ORDER — CYANOCOBALAMIN 1000 MCG/ML IJ SOLN: 1000.0000 ug | INTRAMUSCULAR | 1 refills | Status: AC

## 2024-07-28 MED ORDER — NEEDLES & SYRINGES MISC: 1.0000 | 0 refills | Status: AC

## 2024-07-28 NOTE — Patient Instructions (Signed)
 [facility] at [facility] **VISIT SUMMARY & IMPORTANT INSTRUCTIONS **   You were seen today by Pleasant Barefoot PA-C for your follow-up visit.    IRON DEFICIENCY Your iron levels look great! We will recheck your iron in 1 year.  HIGH PLATELETS Your platelet levels are mildly elevated.  This is most likely due to inflammation.  VITAMIN B12 DEFICIENCY Your vitamin B12 levels look much better. Continue vitamin B12 injections given at home every 4 weeks.  FOLLOW-UP APPOINTMENT: Labs and office visit in 1 year  ** Thank you for trusting me with your healthcare!  I strive to provide all of my patients with quality care at each visit.  If you receive a survey for this visit, I would be so grateful to you for taking the time to provide feedback.  Thank you in advance!  ~ Jolayne Branson                                        Dr. Mickiel Davonna Pleasant Barefoot, PA-C          Delon Hope, NP   - - - - - - - - - - - - - - - - - -     Thank you for choosing [facility] at [facility] to provide your oncology and hematology care.  To afford each patient quality time with our provider, please arrive at least 15 minutes before your scheduled appointment time.   If you have a lab appointment with the Cancer Center please come in thru the Main Entrance and check in at the main information desk.  You need to re-schedule your appointment should you arrive 10 or more minutes late.  We strive to give you quality time with our providers, and arriving late affects you and other patients whose appointments are after yours.  Also, if you no show three or more times for appointments you may be dismissed from the clinic at the providers discretion.     Again, thank you for choosing [facility].  Our hope is that these requests will decrease the amount of time that you wait before being seen by our physicians.        _____________________________________________________________  Should you have questions after your visit to [facility], please contact our office at [phone] and follow the prompts.  Our office hours are 8:00 a.m. and 4:30 p.m. Monday - Friday.  Please note that voicemails left after 4:00 p.m. may not be returned until the following business day.  We are closed weekends and major holidays.  You do have access to a nurse 24-7, just call the main number to the clinic [phone] and do not press any options, hold on the line and a nurse will answer the phone.    For prescription refill requests, have your pharmacy contact our office and allow 72 hours.

## 2024-08-03 ENCOUNTER — Encounter: Payer: Self-pay | Admitting: Family

## 2024-08-03 ENCOUNTER — Ambulatory Visit: Payer: Self-pay | Admitting: Family

## 2024-08-03 VITALS — BP 138/87 | HR 72 | Temp 97.3°F | Ht 61.5 in | Wt 168.4 lb

## 2024-08-03 DIAGNOSIS — F411 Generalized anxiety disorder: Secondary | ICD-10-CM

## 2024-08-03 DIAGNOSIS — B9689 Other specified bacterial agents as the cause of diseases classified elsewhere: Secondary | ICD-10-CM

## 2024-08-03 DIAGNOSIS — K219 Gastro-esophageal reflux disease without esophagitis: Secondary | ICD-10-CM | POA: Diagnosis not present

## 2024-08-03 DIAGNOSIS — M25511 Pain in right shoulder: Secondary | ICD-10-CM | POA: Diagnosis not present

## 2024-08-03 DIAGNOSIS — M545 Low back pain, unspecified: Secondary | ICD-10-CM

## 2024-08-03 DIAGNOSIS — G2581 Restless legs syndrome: Secondary | ICD-10-CM | POA: Diagnosis not present

## 2024-08-03 DIAGNOSIS — E785 Hyperlipidemia, unspecified: Secondary | ICD-10-CM

## 2024-08-03 DIAGNOSIS — E039 Hypothyroidism, unspecified: Secondary | ICD-10-CM | POA: Diagnosis not present

## 2024-08-03 DIAGNOSIS — K5900 Constipation, unspecified: Secondary | ICD-10-CM | POA: Diagnosis not present

## 2024-08-03 DIAGNOSIS — F331 Major depressive disorder, recurrent, moderate: Secondary | ICD-10-CM | POA: Diagnosis not present

## 2024-08-03 DIAGNOSIS — E559 Vitamin D deficiency, unspecified: Secondary | ICD-10-CM

## 2024-08-03 DIAGNOSIS — I10 Essential (primary) hypertension: Secondary | ICD-10-CM | POA: Diagnosis not present

## 2024-08-03 DIAGNOSIS — J208 Acute bronchitis due to other specified organisms: Secondary | ICD-10-CM

## 2024-08-03 DIAGNOSIS — W19XXXA Unspecified fall, initial encounter: Secondary | ICD-10-CM

## 2024-08-03 DIAGNOSIS — F1120 Opioid dependence, uncomplicated: Secondary | ICD-10-CM

## 2024-08-03 MED ORDER — LINACLOTIDE 145 MCG PO CAPS: 145.0000 ug | ORAL_CAPSULE | Freq: Every day | ORAL | 1 refills | Status: AC

## 2024-08-03 MED ORDER — OMEPRAZOLE 40 MG PO CPDR: 40.0000 mg | DELAYED_RELEASE_CAPSULE | Freq: Every day | ORAL | 1 refills | Status: AC

## 2024-08-03 MED ORDER — METOPROLOL TARTRATE 50 MG PO TABS: 50.0000 mg | ORAL_TABLET | Freq: Two times a day (BID) | ORAL | 0 refills | Status: AC

## 2024-08-03 MED ORDER — DESVENLAFAXINE SUCCINATE ER 100 MG PO TB24: 100.0000 mg | ORAL_TABLET | Freq: Every day | ORAL | 0 refills | Status: AC

## 2024-08-03 MED ORDER — BUPROPION HCL ER (XL) 300 MG PO TB24: 300.0000 mg | ORAL_TABLET | Freq: Every day | ORAL | 0 refills | Status: AC

## 2024-08-03 MED ORDER — PRAMIPEXOLE DIHYDROCHLORIDE 0.5 MG PO TABS: 1.0000 mg | ORAL_TABLET | Freq: Every evening | ORAL | 1 refills | Status: AC

## 2024-08-03 MED ORDER — FENOFIBRATE 145 MG PO TABS: 145.0000 mg | ORAL_TABLET | Freq: Every day | ORAL | 0 refills | Status: AC

## 2024-08-03 MED ORDER — ATORVASTATIN CALCIUM 20 MG PO TABS: 20.0000 mg | ORAL_TABLET | Freq: Every day | ORAL | 0 refills | Status: AC

## 2024-08-03 MED ORDER — AZITHROMYCIN 250 MG PO TABS: ORAL_TABLET | ORAL | 0 refills | Status: AC

## 2024-08-03 MED ORDER — LEVOTHYROXINE SODIUM 100 MCG PO TABS: 100.0000 ug | ORAL_TABLET | Freq: Every day | ORAL | 0 refills | Status: AC

## 2024-08-03 NOTE — Patient Instructions (Signed)
Shoulder Pain Many things can cause shoulder pain, including: An injury to the shoulder. Overuse of the shoulder. Arthritis. The source of the pain can be: Inflammation. An injury to the shoulder joint. An injury to a tendon, ligament, or bone. Follow these instructions at home: Pay attention to changes in your symptoms. Let your health care provider know about them. Follow these instructions to relieve your pain. If you have a removable sling: Wear the sling as told by your provider. Remove it only as told by your provider. Check the skin around the sling every day. Tell your provider about any concerns. Loosen the sling if your fingers tingle, become numb, or become cold. Keep the sling clean. If the sling is not waterproof: Do not let it get wet. Remove it to shower or bathe. Move your arm as little as possible, but keep your hand moving to prevent swelling. Managing pain, stiffness, and swelling  If told, put ice on the painful area. If you have a removable sling or immobilizer, remove it as told by your provider. Put ice in a plastic bag. Place a towel between your skin and the bag. Leave the ice on for 20 minutes, 2-3 times a day. If your skin turns bright red, remove the ice right away to prevent skin damage. The risk of damage is higher if you cannot feel pain, heat, or cold. Move your fingers often to reduce stiffness and swelling. Squeeze a soft ball or a foam pad as much as possible. This helps to keep the shoulder from swelling. It also helps to strengthen the arm. General instructions Take over-the-counter and prescription medicines only as told by your provider. Exercise may help with pain management. Perform exercises if told by your provider. You may be referred to a physical therapist to help in your recovery process. Keep all follow-up visits in order to avoid any type of permanent shoulder disability or chronic pain problems. Contact a health care provider  if: Your pain is not relieved with medicines. New pain develops in your arm, hand, or fingers. You loosen your sling and your arm, hand, or fingers remain tingly, numb, swollen, or painful. Get help right away if: Your arm, hand, or fingers turn white or blue. This information is not intended to replace advice given to you by your health care provider. Make sure you discuss any questions you have with your health care provider. Document Revised: 02/02/2022 Document Reviewed: 02/02/2022 Elsevier Patient Education  2024 [employer]  

## 2024-08-03 NOTE — Progress Notes (Signed)
 "  Subjective:    Patient ID: Brittany Lynch, female    DOB: 1960-12-13, 64 y.o.   MRN: [id number]  Chief Complaint  Patient presents with   Medical Management of Chronic Issues    Cough for 1 week. In chest congestion    Pt presents to the office today for chronic follow up.    She is followed by Neurosurgeon and pain management every month and has hx of lumbar laminectomy and hx of cervical spinal fusion.   She is followed by Hematologists every 6 months for iron deficiency and thrombocytosis.  She has RLS and takes mirapex  1 mg that helps.   She fell three weeks on her face and since has not been able to lift her right shoulder greater than 45 degrees.  Hypertension This is a chronic problem. The current episode started more than 1 year ago. The problem has been resolved since onset. The problem is controlled. Associated symptoms include anxiety. Pertinent negatives include no headaches, malaise/fatigue, peripheral edema or shortness of breath. Risk factors for coronary artery disease include dyslipidemia, obesity, sedentary lifestyle and post-menopausal state. The current treatment provides moderate improvement. Identifiable causes of hypertension include a thyroid  problem.  Gastroesophageal Reflux She complains of belching, coughing and heartburn. She reports no hoarse voice. This is a chronic problem. The current episode started more than 1 year ago. The problem occurs occasionally. The symptoms are aggravated by certain foods. Associated symptoms include fatigue. She has tried an antacid and a PPI for the symptoms. The treatment provided moderate relief.  Thyroid  Problem Presents for follow-up visit. Symptoms include anxiety, constipation and fatigue. Patient reports no diarrhea, dry skin or hoarse voice. The symptoms have been stable.  Back Pain This is a chronic problem. The current episode started more than 1 year ago. The problem occurs intermittently. The problem has been waxing  and waning since onset. The pain is present in the lumbar spine. The pain is at a severity of 3/10 (with pain medicaitons). The pain is moderate. The symptoms are aggravated by bending. Pertinent negatives include no fever or headaches. She has tried analgesics for the symptoms. The treatment provided moderate relief.  Anxiety Presents for follow-up visit. Symptoms include excessive worry and nervous/anxious behavior. Patient reports no shortness of breath. Symptoms occur occasionally. The severity of symptoms is mild.    Hyperlipidemia This is a chronic problem. The current episode started more than 1 year ago. The problem is controlled. Recent lipid tests were reviewed and are normal. Exacerbating diseases include obesity. Associated symptoms include myalgias. Pertinent negatives include no shortness of breath. Current antihyperlipidemic treatment includes statins. The current treatment provides mild improvement of lipids. Risk factors for coronary artery disease include dyslipidemia, hypertension, a sedentary lifestyle and obesity.  Depression        This is a chronic problem.  The current episode started more than 1 year ago.   The problem occurs intermittently.  Associated symptoms include fatigue and myalgias.  Associated symptoms include no helplessness, no hopelessness, no headaches and not sad.  Past treatments include SNRIs - Serotonin and norepinephrine reuptake inhibitors.  Past medical history includes thyroid  problem and anxiety.   Constipation This is a chronic problem. The current episode started more than 1 year ago. The problem has been resolved since onset. Her stool frequency is 1 time per day. Associated symptoms include back pain. Pertinent negatives include no diarrhea or fever. She has tried laxatives (linzess ) for the symptoms. The treatment provided moderate relief.  Arthritis Presents for follow-up visit. She complains of pain and stiffness. She reports no joint swelling. The  symptoms have been worsening. Affected locations include the left MCP, right MCP, left shoulder, right shoulder, left elbow, right elbow, left knee and right knee. Her pain is at a severity of 7/10. Associated symptoms include fatigue. Pertinent negatives include no diarrhea or fever.  Cough This is a new problem. The current episode started 1 to 4 weeks ago. The problem has been gradually worsening. The problem occurs every few minutes. The cough is Productive of sputum. Associated symptoms include heartburn, myalgias, nasal congestion and postnasal drip. Pertinent negatives include no chills, ear congestion, ear pain, fever, headaches or shortness of breath. She has tried rest (motrin ) for the symptoms. The treatment provided mild relief.      Review of Systems  Constitutional:  Positive for fatigue. Negative for chills, fever and malaise/fatigue.  HENT:  Positive for postnasal drip. Negative for ear pain and hoarse voice.   Respiratory:  Positive for cough. Negative for shortness of breath.   Gastrointestinal:  Positive for constipation and heartburn. Negative for diarrhea.  Musculoskeletal:  Positive for back pain, myalgias and stiffness. Negative for joint swelling.  Neurological:  Negative for headaches.  Psychiatric/Behavioral:  The patient is nervous/anxious.   All other systems reviewed and are negative.  Family History  Problem Relation Age of Onset   Mental illness Mother    Alcohol abuse Father    Diabetes Father    Heart disease Father    Psoriasis Daughter    Arthritis Daughter        Psoriatic   Psoriasis Niece    Arthritis Niece        Psoriatic   Breast cancer Neg Hx    Social History   Socioeconomic History   Marital status: Legally Separated    Spouse name: Not on file   Number of children: 3   Years of education: Not on file   Highest education level: 9th grade  Occupational History   Occupation: Retired  Tobacco Use   Smoking status: Former    Current  packs/day: 0.00    Types: Cigarettes    Quit date: 07/16/2001    Years since quitting: 23.0    Passive exposure: Current   Smokeless tobacco: Never  Vaping Use   Vaping status: Some Days   Devices: THC pen  Substance and Sexual Activity   Alcohol use: No    Comment: hx alcoholism. none since age 29   Drug use: Yes    Types: Marijuana   Sexual activity: Yes  Other Topics Concern   Not on file  Social History Narrative   Lives in apartment basement of daughters home    Social Drivers of Health   Tobacco Use: Medium Risk (08/03/2024)   Patient History    Smoking Tobacco Use: Former    Smokeless Tobacco Use: Never    Passive Exposure: Current  Physicist, Medical Strain: High Risk (01/31/2024)   Overall Financial Resource Strain (CARDIA)    Difficulty of Paying Living Expenses: Hard  Food Insecurity: Food Insecurity Present (01/31/2024)   Epic    Worried About Programme Researcher, Broadcasting/film/video in the Last Year: Often true    Ran Out of Food in the Last Year: Often true  Transportation Needs: No Transportation Needs (01/31/2024)   Epic    Lack of Transportation (Medical): No    Lack of Transportation (Non-Medical): No  Physical Activity: Insufficiently Active (09/09/2023)   Exercise  Vital Sign    Days of Exercise per Week: 3 days    Minutes of Exercise per Session: 30 min  Stress: No Stress Concern Present (09/09/2023)   [employer] of Occupational Health - Occupational Stress Questionnaire    Feeling of Stress : Not at all  Social Connections: Socially Isolated (01/31/2024)   Social Connection and Isolation Panel    Frequency of Communication with Friends and Family: More than three times a week    Frequency of Social Gatherings with Friends and Family: Once a week    Attends Religious Services: Never    Database Administrator or Organizations: No    Attends Engineer, Structural: Not on file    Marital Status: Separated  Depression (PHQ2-9): Low Risk (07/28/2024)   Depression  (PHQ2-9)    PHQ-2 Score: 0  Alcohol Screen: Low Risk (09/09/2023)   Alcohol Screen    Last Alcohol Screening Score (AUDIT): 0  Housing: Unknown (01/31/2024)   Epic    Unable to Pay for Housing in the Last Year: No    Number of Times Moved in the Last Year: Not on file    Homeless in the Last Year: No  Utilities: Not At Risk (09/09/2023)   [facility] Utilities    Threatened with loss of utilities: No  Health Literacy: Adequate Health Literacy (09/09/2023)   B1300 Health Literacy    Frequency of need for help with medical instructions: Never       Objective:   Physical Exam Vitals reviewed.  Constitutional:      General: She is not in acute distress.    Appearance: She is well-developed. She is obese.  HENT:     Head: Normocephalic and atraumatic.     Right Ear: Tympanic membrane normal.     Left Ear: Tympanic membrane normal.  Eyes:     Pupils: Pupils are equal, round, and reactive to light.  Neck:     Thyroid : No thyromegaly.  Cardiovascular:     Rate and Rhythm: Normal rate and regular rhythm.     Heart sounds: Normal heart sounds. No murmur heard. Pulmonary:     Effort: Pulmonary effort is normal. No respiratory distress.     Breath sounds: Normal breath sounds. No wheezing.     Comments: Dry nonproductive  Abdominal:     General: Bowel sounds are normal. There is no distension.     Palpations: Abdomen is soft.     Tenderness: There is no abdominal tenderness.  Genitourinary:    Comments:   Musculoskeletal:        General: Tenderness present.     Cervical back: Normal range of motion and neck supple.     Comments: Unable to abduct right shoulder greater than 45 degrees.  Skin:    General: Skin is warm and dry.  Neurological:     Mental Status: She is alert and oriented to person, place, and time.     Cranial Nerves: No cranial nerve deficit.     Deep Tendon Reflexes: Reflexes are normal and symmetric.  Psychiatric:        Mood and Affect: Mood is anxious.         Behavior: Behavior normal.        Thought Content: Thought content normal.        Judgment: Judgment normal.        BP 138/87   Pulse 72   Temp (!) 97.3 F (36.3 C) (Temporal)   Ht 5' 1.5 (  1.562 m)   Wt 168 lb 6.4 oz (76.4 kg)   SpO2 98%   BMI 31.30 kg/m   Assessment & Plan:  Brittany Lynch comes in today with chief complaint of Medical Management of Chronic Issues (Cough for 1 week. In chest congestion )   Diagnosis and orders addressed:  1. Hyperlipidemia, unspecified hyperlipidemia type - atorvastatin  (LIPITOR) 20 MG tablet; Take 1 tablet (20 mg total) by mouth daily.  Dispense: 90 tablet; Refill: 0 - fenofibrate  (TRICOR ) 145 MG tablet; Take 1 tablet (145 mg total) by mouth daily.  Dispense: 90 tablet; Refill: 0  2. GAD (generalized anxiety disorder) - buPROPion  (WELLBUTRIN  XL) 300 MG 24 hr tablet; Take 1 tablet (300 mg total) by mouth daily.  Dispense: 90 tablet; Refill: 0 - desvenlafaxine  (PRISTIQ ) 100 MG 24 hr tablet; Take 1 tablet (100 mg total) by mouth daily.  Dispense: 90 tablet; Refill: 0  3. Moderate episode of recurrent major depressive disorder (HCC) - buPROPion  (WELLBUTRIN  XL) 300 MG 24 hr tablet; Take 1 tablet (300 mg total) by mouth daily.  Dispense: 90 tablet; Refill: 0 - desvenlafaxine  (PRISTIQ ) 100 MG 24 hr tablet; Take 1 tablet (100 mg total) by mouth daily.  Dispense: 90 tablet; Refill: 0  4. Hypothyroidism, unspecified type - levothyroxine  (SYNTHROID ) 100 MCG tablet; Take 1 tablet (100 mcg total) by mouth daily.  Dispense: 90 tablet; Refill: 0  5. Essential hypertension - metoprolol  tartrate (LOPRESSOR ) 50 MG tablet; Take 1 tablet (50 mg total) by mouth 2 (two) times daily.  Dispense: 180 tablet; Refill: 0  6. Gastroesophageal reflux disease, unspecified whether esophagitis present - omeprazole  (PRILOSEC) 40 MG capsule; Take 1 capsule (40 mg total) by mouth daily.  Dispense: 90 capsule; Refill: 1  7. RLS (restless legs syndrome) - pramipexole   (MIRAPEX ) 0.5 MG tablet; Take 2 tablets (1 mg total) by mouth every evening.  Dispense: 180 tablet; Refill: 1  8. Chronic low back pain, unspecified back pain laterality, unspecified whether sciatica present (Primary)   9. Constipation, unspecified constipation type  10. Primary hypertension  11. Vitamin D  deficiency  12. Acute bacterial bronchitis - Take meds as prescribed - Use a cool mist humidifier  -Use saline nose sprays frequently -Force fluids -For any cough or congestion  Use plain Mucinex- regular strength or max strength is fine -For fever or aces or pains- take tylenol  or ibuprofen . -Throat lozenges if help - azithromycin  (ZITHROMAX ) 250 MG tablet; Take 500 mg once, then 250 mg for four days  Dispense: 6 tablet; Refill: 0  13. Acute pain of right shoulder Pt will come in and get x-ray this week. We do not have x-ray today.  May need referral to Ortho or PT - DG Shoulder Right; Future  14. Fall in home, initial encounter - DG Shoulder Right; Future  15. Continuous opioid dependence (HCC) Keep follow up with pain management   Labs pending Keep specialists follow up Continue current medications  Health Maintenance reviewed Diet and exercise encouraged  Follow up plan: 3 months    Bari Learn, FNP   "

## 2024-09-09 ENCOUNTER — Ambulatory Visit: Payer: Self-pay

## 2024-10-07 ENCOUNTER — Ambulatory Visit

## 2024-11-02 ENCOUNTER — Ambulatory Visit: Admitting: Family

## 2024-11-03 ENCOUNTER — Ambulatory Visit: Admitting: Internal Medicine

## 2025-07-19 ENCOUNTER — Inpatient Hospital Stay

## 2025-07-26 ENCOUNTER — Inpatient Hospital Stay: Admitting: Physician Assistant
